# Patient Record
Sex: Female | Born: 1955 | Race: Black or African American | Hispanic: No | Marital: Single | Smoking: Current some day smoker
Health system: Southern US, Community
[De-identification: ages and names within clinical notes are randomized; demographics above are authoritative.]

## PROBLEM LIST (undated history)

## (undated) DIAGNOSIS — C50919 Malignant neoplasm of unspecified site of unspecified female breast: Secondary | ICD-10-CM

## (undated) DIAGNOSIS — I10 Essential (primary) hypertension: Secondary | ICD-10-CM

## (undated) DIAGNOSIS — C801 Malignant (primary) neoplasm, unspecified: Secondary | ICD-10-CM

## (undated) DIAGNOSIS — Z853 Personal history of malignant neoplasm of breast: Secondary | ICD-10-CM

## (undated) DIAGNOSIS — IMO0002 Reserved for concepts with insufficient information to code with codable children: Secondary | ICD-10-CM

## (undated) DIAGNOSIS — R7303 Prediabetes: Secondary | ICD-10-CM

## (undated) DIAGNOSIS — Z923 Personal history of irradiation: Secondary | ICD-10-CM

## (undated) DIAGNOSIS — J189 Pneumonia, unspecified organism: Secondary | ICD-10-CM

## (undated) DIAGNOSIS — Z87891 Personal history of nicotine dependence: Secondary | ICD-10-CM

## (undated) HISTORY — DX: Personal history of nicotine dependence: Z87.891

## (undated) HISTORY — DX: Malignant (primary) neoplasm, unspecified: C80.1

## (undated) HISTORY — DX: Personal history of malignant neoplasm of breast: Z85.3

## (undated) HISTORY — DX: Essential (primary) hypertension: I10

## (undated) HISTORY — DX: Pneumonia, unspecified organism: J18.9

---

## 1993-01-05 HISTORY — PX: BREAST BIOPSY: SHX20

## 1993-01-05 HISTORY — PX: ABDOMINAL HYSTERECTOMY: SHX81

## 2003-10-12 ENCOUNTER — Emergency Department: Payer: Self-pay | Admitting: Emergency Medicine

## 2003-10-27 ENCOUNTER — Emergency Department: Payer: Self-pay | Admitting: Emergency Medicine

## 2003-12-12 ENCOUNTER — Ambulatory Visit: Payer: Self-pay

## 2004-03-26 ENCOUNTER — Emergency Department: Payer: Self-pay | Admitting: Emergency Medicine

## 2004-12-10 ENCOUNTER — Ambulatory Visit: Payer: Self-pay

## 2004-12-23 ENCOUNTER — Ambulatory Visit: Payer: Self-pay

## 2005-01-05 DIAGNOSIS — C50919 Malignant neoplasm of unspecified site of unspecified female breast: Secondary | ICD-10-CM

## 2005-01-05 DIAGNOSIS — IMO0001 Reserved for inherently not codable concepts without codable children: Secondary | ICD-10-CM

## 2005-01-05 DIAGNOSIS — Z923 Personal history of irradiation: Secondary | ICD-10-CM

## 2005-01-05 HISTORY — DX: Reserved for inherently not codable concepts without codable children: IMO0001

## 2005-01-05 HISTORY — PX: AXILLARY SENTINEL NODE BIOPSY: SHX5738

## 2005-01-05 HISTORY — PX: BREAST BIOPSY: SHX20

## 2005-01-05 HISTORY — PX: BREAST SURGERY: SHX581

## 2005-01-05 HISTORY — DX: Personal history of irradiation: Z92.3

## 2005-01-05 HISTORY — DX: Malignant neoplasm of unspecified site of unspecified female breast: C50.919

## 2005-01-05 HISTORY — PX: BREAST LUMPECTOMY: SHX2

## 2005-01-14 ENCOUNTER — Other Ambulatory Visit: Payer: Self-pay

## 2005-01-16 ENCOUNTER — Ambulatory Visit: Payer: Self-pay | Admitting: General Surgery

## 2005-01-30 ENCOUNTER — Ambulatory Visit: Payer: Self-pay | Admitting: General Surgery

## 2005-02-19 ENCOUNTER — Ambulatory Visit: Payer: Self-pay | Admitting: General Surgery

## 2005-02-24 ENCOUNTER — Emergency Department: Payer: Self-pay | Admitting: General Practice

## 2005-02-24 ENCOUNTER — Other Ambulatory Visit: Payer: Self-pay

## 2005-03-05 ENCOUNTER — Ambulatory Visit: Payer: Self-pay | Admitting: General Surgery

## 2005-04-05 ENCOUNTER — Ambulatory Visit: Payer: Self-pay | Admitting: General Surgery

## 2005-04-19 ENCOUNTER — Emergency Department: Payer: Self-pay | Admitting: Emergency Medicine

## 2005-05-05 ENCOUNTER — Ambulatory Visit: Payer: Self-pay | Admitting: General Surgery

## 2005-06-01 ENCOUNTER — Ambulatory Visit: Payer: Self-pay | Admitting: Family Medicine

## 2005-06-05 ENCOUNTER — Ambulatory Visit: Payer: Self-pay | Admitting: General Surgery

## 2005-08-06 ENCOUNTER — Ambulatory Visit: Payer: Self-pay | Admitting: Internal Medicine

## 2005-08-14 ENCOUNTER — Other Ambulatory Visit: Payer: Self-pay

## 2005-08-14 ENCOUNTER — Observation Stay: Payer: Self-pay | Admitting: Internal Medicine

## 2005-08-18 ENCOUNTER — Ambulatory Visit: Payer: Self-pay | Admitting: Internal Medicine

## 2005-09-05 ENCOUNTER — Ambulatory Visit: Payer: Self-pay | Admitting: Internal Medicine

## 2005-11-23 ENCOUNTER — Ambulatory Visit: Payer: Self-pay | Admitting: Radiation Oncology

## 2006-01-20 ENCOUNTER — Ambulatory Visit: Payer: Self-pay

## 2006-01-22 ENCOUNTER — Ambulatory Visit: Payer: Self-pay | Admitting: Internal Medicine

## 2006-02-05 ENCOUNTER — Ambulatory Visit: Payer: Self-pay | Admitting: Internal Medicine

## 2006-04-05 ENCOUNTER — Emergency Department: Payer: Self-pay | Admitting: Internal Medicine

## 2006-04-05 ENCOUNTER — Other Ambulatory Visit: Payer: Self-pay

## 2006-04-06 ENCOUNTER — Emergency Department: Payer: Self-pay | Admitting: General Practice

## 2006-06-11 ENCOUNTER — Ambulatory Visit: Payer: Self-pay | Admitting: Internal Medicine

## 2006-07-01 ENCOUNTER — Ambulatory Visit: Payer: Self-pay | Admitting: Family Medicine

## 2006-07-06 ENCOUNTER — Ambulatory Visit: Payer: Self-pay | Admitting: Internal Medicine

## 2006-07-16 ENCOUNTER — Ambulatory Visit: Payer: Self-pay | Admitting: Internal Medicine

## 2006-08-04 IMAGING — NM NM SENTINAL NODE INJECTION (BREAST) - NO REPORT
1 series · 4 of 4 positions shown · non-contrast
Comparison: none

REASON FOR EXAM: Rt breast cancer
COMMENTS:

[Series 0: breastsentinel · 1.9mm · 1.95mm/px · 4 of 4 frames shown]
[frame 1/4]
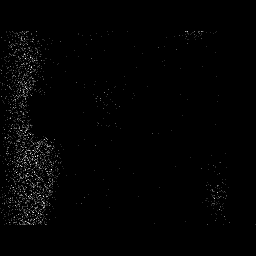
[frame 2/4]
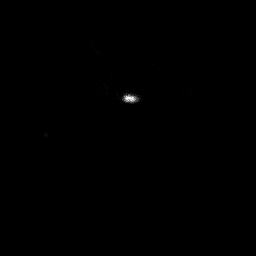
[frame 3/4]
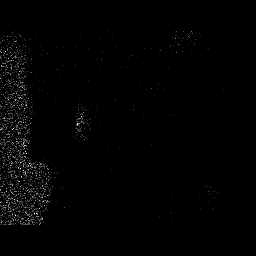
[frame 4/4]
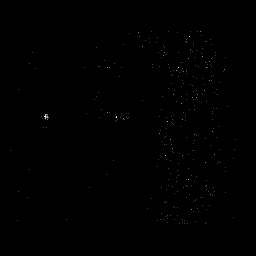

[4 of 4 positions shown; findings below may reference images not displayed]

PROCEDURE:     NM  - NM SENTINEL NODE  BREAST  - January 30, 2005 [DATE]

RESULT:          Following sterile preparation and draping of the patient,
local anesthesia was administered  with 1% lidocaine, and 1 mCi of
technetium-55m sulfur colloid was administered in the periareolar region for
sentinel node imaging.  There were no complications.  No definite nodes are
noted at 30 minutes.
IMPRESSION: Successful injection for sentinel node imaging, no
nodes noted at 30 minutes.  The patient was requested in surgery.

## 2006-08-06 ENCOUNTER — Ambulatory Visit: Payer: Self-pay | Admitting: Internal Medicine

## 2006-08-07 ENCOUNTER — Emergency Department: Payer: Self-pay

## 2006-08-07 ENCOUNTER — Other Ambulatory Visit: Payer: Self-pay

## 2006-11-06 ENCOUNTER — Ambulatory Visit: Payer: Self-pay | Admitting: Internal Medicine

## 2006-11-12 ENCOUNTER — Ambulatory Visit: Payer: Self-pay | Admitting: Internal Medicine

## 2006-12-06 ENCOUNTER — Ambulatory Visit: Payer: Self-pay | Admitting: Internal Medicine

## 2007-01-26 ENCOUNTER — Ambulatory Visit: Payer: Self-pay

## 2007-05-06 ENCOUNTER — Ambulatory Visit: Payer: Self-pay | Admitting: Internal Medicine

## 2007-05-13 ENCOUNTER — Ambulatory Visit: Payer: Self-pay | Admitting: Internal Medicine

## 2007-06-06 ENCOUNTER — Ambulatory Visit: Payer: Self-pay | Admitting: Internal Medicine

## 2007-07-18 ENCOUNTER — Observation Stay: Payer: Self-pay | Admitting: Internal Medicine

## 2007-07-18 ENCOUNTER — Other Ambulatory Visit: Payer: Self-pay

## 2007-08-16 ENCOUNTER — Other Ambulatory Visit: Payer: Self-pay

## 2007-08-16 ENCOUNTER — Emergency Department: Payer: Self-pay | Admitting: Emergency Medicine

## 2007-09-06 ENCOUNTER — Ambulatory Visit: Payer: Self-pay | Admitting: Internal Medicine

## 2007-10-06 ENCOUNTER — Ambulatory Visit: Payer: Self-pay | Admitting: Gastroenterology

## 2007-11-06 ENCOUNTER — Ambulatory Visit: Payer: Self-pay | Admitting: Internal Medicine

## 2007-11-16 ENCOUNTER — Ambulatory Visit: Payer: Self-pay | Admitting: Internal Medicine

## 2007-12-06 ENCOUNTER — Ambulatory Visit: Payer: Self-pay | Admitting: Internal Medicine

## 2008-02-13 ENCOUNTER — Ambulatory Visit: Payer: Self-pay | Admitting: Internal Medicine

## 2008-03-13 ENCOUNTER — Ambulatory Visit: Payer: Self-pay | Admitting: Family Medicine

## 2008-05-01 ENCOUNTER — Emergency Department: Payer: Self-pay | Admitting: Emergency Medicine

## 2008-06-05 ENCOUNTER — Ambulatory Visit: Payer: Self-pay | Admitting: Internal Medicine

## 2008-06-15 ENCOUNTER — Ambulatory Visit: Payer: Self-pay | Admitting: Internal Medicine

## 2008-07-05 ENCOUNTER — Ambulatory Visit: Payer: Self-pay | Admitting: Internal Medicine

## 2008-12-15 ENCOUNTER — Emergency Department: Payer: Self-pay | Admitting: Emergency Medicine

## 2009-02-21 ENCOUNTER — Ambulatory Visit: Payer: Self-pay | Admitting: General Surgery

## 2009-05-19 ENCOUNTER — Emergency Department: Payer: Self-pay | Admitting: Emergency Medicine

## 2009-08-03 ENCOUNTER — Emergency Department: Payer: Self-pay | Admitting: Emergency Medicine

## 2009-11-11 ENCOUNTER — Ambulatory Visit: Payer: Self-pay | Admitting: Internal Medicine

## 2009-12-05 ENCOUNTER — Ambulatory Visit: Payer: Self-pay | Admitting: Internal Medicine

## 2009-12-05 ENCOUNTER — Emergency Department: Payer: Self-pay | Admitting: Emergency Medicine

## 2010-02-25 ENCOUNTER — Ambulatory Visit: Payer: Self-pay | Admitting: General Surgery

## 2010-06-13 ENCOUNTER — Ambulatory Visit: Payer: Self-pay | Admitting: Internal Medicine

## 2010-07-06 ENCOUNTER — Ambulatory Visit: Payer: Self-pay | Admitting: Internal Medicine

## 2010-09-02 ENCOUNTER — Emergency Department: Payer: Self-pay | Admitting: *Deleted

## 2010-12-08 ENCOUNTER — Emergency Department: Payer: Self-pay | Admitting: Emergency Medicine

## 2011-01-03 ENCOUNTER — Emergency Department: Payer: Self-pay | Admitting: Emergency Medicine

## 2011-03-10 ENCOUNTER — Ambulatory Visit: Payer: Self-pay | Admitting: General Surgery

## 2011-08-21 ENCOUNTER — Emergency Department: Payer: Self-pay | Admitting: Emergency Medicine

## 2011-08-21 LAB — URINALYSIS, COMPLETE
Ketone: NEGATIVE
Ph: 5 (ref 4.5–8.0)
Protein: NEGATIVE
Specific Gravity: 1.012 (ref 1.003–1.030)
WBC UR: 3 /HPF (ref 0–5)

## 2011-11-13 ENCOUNTER — Emergency Department: Payer: Self-pay | Admitting: Emergency Medicine

## 2011-11-13 LAB — URINALYSIS, COMPLETE
Bilirubin,UR: NEGATIVE
Glucose,UR: NEGATIVE mg/dL (ref 0–75)
Leukocyte Esterase: NEGATIVE
Nitrite: NEGATIVE
RBC,UR: 4 /HPF (ref 0–5)
Squamous Epithelial: 10
WBC UR: <1 /HPF (ref 0–5)

## 2011-11-13 LAB — COMPREHENSIVE METABOLIC PANEL
Albumin: 3.7 g/dL (ref 3.4–5.0)
Anion Gap: 8 (ref 7–16)
BUN: 10 mg/dL (ref 7–18)
Bilirubin,Total: 0.4 mg/dL (ref 0.2–1.0)
Calcium, Total: 8.8 mg/dL (ref 8.5–10.1)
Chloride: 109 mmol/L — ABNORMAL HIGH (ref 98–107)
Creatinine: 0.8 mg/dL (ref 0.60–1.30)
Glucose: 100 mg/dL — ABNORMAL HIGH (ref 65–99)
Osmolality: 284 (ref 275–301)
Potassium: 4.1 mmol/L (ref 3.5–5.1)
Sodium: 143 mmol/L (ref 136–145)
Total Protein: 7.4 g/dL (ref 6.4–8.2)

## 2011-11-13 LAB — CBC
HCT: 41.1 % (ref 35.0–47.0)
HGB: 14.3 g/dL (ref 12.0–16.0)
MCH: 31.5 pg (ref 26.0–34.0)
MCHC: 34.7 g/dL (ref 32.0–36.0)
MCV: 91 fL (ref 80–100)
RDW: 12.2 % (ref 11.5–14.5)

## 2012-01-07 ENCOUNTER — Emergency Department: Payer: Self-pay | Admitting: Emergency Medicine

## 2012-02-20 ENCOUNTER — Encounter: Payer: Self-pay | Admitting: General Surgery

## 2012-03-28 ENCOUNTER — Ambulatory Visit: Payer: Self-pay | Admitting: Family Medicine

## 2012-04-06 ENCOUNTER — Ambulatory Visit: Payer: Self-pay | Admitting: General Surgery

## 2012-04-07 ENCOUNTER — Encounter: Payer: Self-pay | Admitting: General Surgery

## 2012-04-07 NOTE — Progress Notes (Signed)
Quick Note:  Make sure the additional views are done prior to her office visit ______ 

## 2012-04-08 ENCOUNTER — Ambulatory Visit: Payer: Self-pay | Admitting: General Surgery

## 2012-04-08 NOTE — Progress Notes (Signed)
Patient is scheduled for additional views for 04-08-12 @ 10 am. She is scheduled for office visit follow up on 04-18-12 @ 2 pm.

## 2012-04-11 ENCOUNTER — Encounter: Payer: Self-pay | Admitting: General Surgery

## 2012-04-12 NOTE — Progress Notes (Signed)
Patient had additional views completed on 04-08-12.

## 2012-04-18 ENCOUNTER — Ambulatory Visit: Payer: Self-pay | Admitting: General Surgery

## 2012-05-12 ENCOUNTER — Ambulatory Visit: Payer: Self-pay | Admitting: General Surgery

## 2012-05-17 ENCOUNTER — Emergency Department: Payer: Self-pay | Admitting: Emergency Medicine

## 2012-05-17 LAB — CBC
HCT: 39.8 % (ref 35.0–47.0)
HGB: 13.8 g/dL (ref 12.0–16.0)
MCH: 31.2 pg (ref 26.0–34.0)
MCHC: 34.8 g/dL (ref 32.0–36.0)
MCV: 90 fL (ref 80–100)
Platelet: 166 10*3/uL (ref 150–440)
WBC: 5.2 10*3/uL (ref 3.6–11.0)

## 2012-05-17 LAB — COMPREHENSIVE METABOLIC PANEL
Albumin: 3.7 g/dL (ref 3.4–5.0)
Alkaline Phosphatase: 70 U/L (ref 50–136)
Anion Gap: 5 — ABNORMAL LOW (ref 7–16)
BUN: 15 mg/dL (ref 7–18)
Bilirubin,Total: 0.3 mg/dL (ref 0.2–1.0)
Chloride: 106 mmol/L (ref 98–107)
Co2: 28 mmol/L (ref 21–32)
EGFR (Non-African Amer.): >60
Glucose: 95 mg/dL (ref 65–99)
Osmolality: 278 (ref 275–301)
Potassium: 3.8 mmol/L (ref 3.5–5.1)
SGOT(AST): 14 U/L — ABNORMAL LOW (ref 15–37)
Sodium: 139 mmol/L (ref 136–145)

## 2012-05-17 LAB — TROPONIN I: Troponin-I: <0.02 ng/mL

## 2012-05-17 LAB — PRO B NATRIURETIC PEPTIDE: B-Type Natriuretic Peptide: 55 pg/mL (ref 0–125)

## 2012-06-07 ENCOUNTER — Ambulatory Visit: Payer: Self-pay | Admitting: General Surgery

## 2012-06-08 ENCOUNTER — Encounter: Payer: Self-pay | Admitting: *Deleted

## 2012-10-27 ENCOUNTER — Ambulatory Visit (INDEPENDENT_AMBULATORY_CARE_PROVIDER_SITE_OTHER): Payer: BC Managed Care – PPO | Admitting: General Surgery

## 2012-10-27 ENCOUNTER — Encounter: Payer: Self-pay | Admitting: General Surgery

## 2012-10-27 VITALS — BP 140/78 | HR 78 | Resp 12 | Ht 62.0 in | Wt 156.0 lb

## 2012-10-27 DIAGNOSIS — Z860100 Personal history of colon polyps, unspecified: Secondary | ICD-10-CM | POA: Insufficient documentation

## 2012-10-27 DIAGNOSIS — Z853 Personal history of malignant neoplasm of breast: Secondary | ICD-10-CM

## 2012-10-27 MED ORDER — POLYETHYLENE GLYCOL 3350 17 GM/SCOOP PO POWD: 17.0000 g | Freq: Every day | ORAL | Status: DC

## 2012-10-27 NOTE — Progress Notes (Signed)
Patient ID: Adriana Berg, female   DOB: Feb 14, 1955, 57 y.o.   MRN: [id number]  Chief Complaint  Patient presents with  . Follow-up    mammogram    HPI Adriana Berg is a 57 y.o. female who presents for a breast evaluation. The most recent mammogram was done on 04/07/12.  Patient does perform regular self breast checks and gets regular mammograms done.    HPI  Past Medical History  Diagnosis Date  . Unspecified essential hypertension   . Personal history of tobacco use, presenting hazards to health   . Personal history of malignant neoplasm of breast     invasive ductal carcinoma with tubular features,Stage 1,ER pos,HER2 neg  . Breast screening, unspecified   . Special screening for malignant neoplasms, colon     Past Surgical History  Procedure Laterality Date  . Axillary sentinel node biopsy Right 2007  . Breast biopsy Right 2007  . Breast cyst excision Right 1996  . Abdominal hysterectomy  1995  . Colonoscopy  2009    History reviewed. No pertinent family history.  Social History History  Substance Use Topics  . Smoking status: Current Every Day Smoker -- 0.50 packs/day for 20 years  . Smokeless tobacco: Never Used  . Alcohol Use: No    Allergies  Allergen Reactions  . Azithromycin Swelling    Current Outpatient Prescriptions  Medication Sig Dispense Refill  . ibuprofen (ADVIL,MOTRIN) 200 MG tablet Take 200 mg by mouth every 6 (six) hours as needed for pain.      . metoprolol tartrate (LOPRESSOR) 25 MG tablet Take 25 mg by mouth 2 (two) times daily.      . quinapril (ACCUPRIL) 40 MG tablet Take 1 tablet by mouth daily.      . polyethylene glycol powder (GLYCOLAX/MIRALAX) powder Take 17 g by mouth daily.  255 g  0   No current facility-administered medications for this visit.    Review of Systems Review of Systems  Constitutional: Negative.   Respiratory: Negative.   Cardiovascular: Negative.     Blood pressure 140/78, pulse 78, resp. rate 12, height 5\' 2"   (1.575 m), weight 156 lb (70.761 kg).  Physical Exam Physical Exam  Constitutional: She is oriented to person, place, and time. She appears well-developed and well-nourished.  Eyes: No scleral icterus.  Neck: No mass and no thyromegaly present.  Cardiovascular: Normal rate, regular rhythm, normal heart sounds, intact distal pulses and normal pulses.   Pulses:      Dorsalis pedis pulses are 2+ on the right side, and 2+ on the left side.       Posterior tibial pulses are 2+ on the right side, and 2+ on the left side.  No edema   Pulmonary/Chest: Breath sounds normal. Right breast exhibits no inverted nipple, no mass, no nipple discharge, no skin change and no tenderness. Left breast exhibits no inverted nipple, no mass, no nipple discharge, no skin change and no tenderness.  Right breast lumpectomy site is well healed.  Abdominal: Soft. Normal appearance and bowel sounds are normal. There is no tenderness.  Lymphadenopathy:    She has no cervical adenopathy.    She has no axillary adenopathy.  Neurological: She is alert and oriented to person, place, and time.  Skin: Skin is warm and dry.    Data Reviewed Mammogram reviewed   Assessment    Stable exam. 7 yrs post right breast CA treatment. Has history opf colon polyps and is dure for surveillance colonoscopy.  Plan    Patient to return in 6 months bilateral diagnotic mammogram.     Patient has been scheduled for a colonoscopy on 12/27/12 @ [facility]. Patient is aware of date and instructions. Patient to call with any questions or changes in health status or medications.  SANKAR,SEEPLAPUTHUR G 10/27/2012, 11:38 AM

## 2012-10-27 NOTE — Patient Instructions (Addendum)
Patient to return in 6 months  bilateral screening mammogram. Colonoscopy A colonoscopy is an exam to evaluate your entire colon. In this exam, your colon is cleansed. A long fiberoptic tube is inserted through your rectum and into your colon. The fiberoptic scope (endoscope) is a long bundle of enclosed and very flexible fibers. These fibers transmit light to the area examined and send images from that area to your caregiver. Discomfort is usually minimal. You may be given a drug to help you sleep (sedative) during or prior to the procedure. This exam helps to detect lumps (tumors), polyps, inflammation, and areas of bleeding. Your caregiver may also take a small piece of tissue (biopsy) that will be examined under a microscope. LET YOUR CAREGIVER KNOW ABOUT:   Allergies to food or medicine.  Medicines taken, including vitamins, herbs, eyedrops, over-the-counter medicines, and creams.  Use of steroids (by mouth or creams).  Previous problems with anesthetics or numbing medicines.  History of bleeding problems or blood clots.  Previous surgery.  Other health problems, including diabetes and kidney problems.  Possibility of pregnancy, if this applies. BEFORE THE PROCEDURE   A clear liquid diet may be required for 2 days before the exam.  Ask your caregiver about changing or stopping your regular medications.  Liquid injections (enemas) or laxatives may be required.  A large amount of electrolyte solution may be given to you to drink over a short period of time. This solution is used to clean out your colon.  You should be present 60 minutes prior to your procedure or as directed by your caregiver. AFTER THE PROCEDURE   If you received a sedative or pain relieving medication, you will need to arrange for someone to drive you home.  Occasionally, there is a little blood passed with the first bowel movement. Do not be concerned. FINDING OUT THE RESULTS OF YOUR TEST Not all test  results are available during your visit. If your test results are not back during the visit, make an appointment with your caregiver to find out the results. Do not assume everything is normal if you have not heard from your caregiver or the medical facility. It is important for you to follow up on all of your test results. HOME CARE INSTRUCTIONS   It is not unusual to pass moderate amounts of gas and experience mild abdominal cramping following the procedure. This is due to air being used to inflate your colon during the exam. Walking or a warm pack on your belly (abdomen) may help.  You may resume all normal meals and activities after sedatives and medicines have worn off.  Only take over-the-counter or prescription medicines for pain, discomfort, or fever as directed by your caregiver. Do not use aspirin or blood thinners if a biopsy was taken. Consult your caregiver for medicine usage if biopsies were taken. SEEK IMMEDIATE MEDICAL CARE IF:   You have a fever.  You pass large blood clots or fill a toilet with blood following the procedure. This may also occur 10 to 14 days following the procedure. This is more likely if a biopsy was taken.  You develop abdominal pain that keeps getting worse and cannot be relieved with medicine. Document Released: 12/20/1999 Document Revised: 03/16/2011 Document Reviewed: 08/04/2007 [facility] Patient Information 2014 [place], [place].   Patient has been scheduled for a colonoscopy on 12/27/12 @ [facility]. Patient is aware of date and instructions. Patient to call with any questions or changes in health status or medications.

## 2012-10-27 NOTE — Addendum Note (Signed)
Addended by: Kieth Brightly on: 10/27/2012 11:41 AM   Modules accepted: Level of Service

## 2012-11-08 ENCOUNTER — Ambulatory Visit: Payer: Self-pay | Admitting: Family Medicine

## 2012-12-22 ENCOUNTER — Other Ambulatory Visit: Payer: Self-pay | Admitting: General Surgery

## 2012-12-22 ENCOUNTER — Telehealth: Payer: Self-pay | Admitting: *Deleted

## 2012-12-22 DIAGNOSIS — Z86010 Personal history of colonic polyps: Secondary | ICD-10-CM

## 2012-12-22 MED ORDER — POLYETHYLENE GLYCOL 3350 17 GM/SCOOP PO POWD: ORAL | Status: DC

## 2012-12-22 NOTE — Telephone Encounter (Signed)
Patient called the office to report that she needs Miralax prescription re-sent to pharmacy for 12-27-12 colonoscopy. This has been sent in as requested.

## 2012-12-27 ENCOUNTER — Ambulatory Visit: Payer: Self-pay | Admitting: General Surgery

## 2012-12-27 DIAGNOSIS — K620 Anal polyp: Secondary | ICD-10-CM

## 2012-12-27 DIAGNOSIS — Z86010 Personal history of colonic polyps: Secondary | ICD-10-CM

## 2012-12-27 DIAGNOSIS — K621 Rectal polyp: Secondary | ICD-10-CM

## 2012-12-27 HISTORY — PX: COLONOSCOPY: SHX174

## 2013-01-02 ENCOUNTER — Encounter: Payer: Self-pay | Admitting: General Surgery

## 2013-04-07 ENCOUNTER — Ambulatory Visit: Payer: Self-pay | Admitting: General Surgery

## 2013-04-10 ENCOUNTER — Encounter: Payer: Self-pay | Admitting: General Surgery

## 2013-04-17 ENCOUNTER — Ambulatory Visit (INDEPENDENT_AMBULATORY_CARE_PROVIDER_SITE_OTHER): Payer: BC Managed Care – PPO | Admitting: General Surgery

## 2013-04-17 ENCOUNTER — Encounter: Payer: Self-pay | Admitting: General Surgery

## 2013-04-17 ENCOUNTER — Other Ambulatory Visit: Payer: BC Managed Care – PPO

## 2013-04-17 VITALS — BP 130/74 | HR 78 | Resp 14 | Ht 62.0 in | Wt 160.0 lb

## 2013-04-17 DIAGNOSIS — Z853 Personal history of malignant neoplasm of breast: Secondary | ICD-10-CM

## 2013-04-17 DIAGNOSIS — N644 Mastodynia: Secondary | ICD-10-CM

## 2013-04-17 NOTE — Progress Notes (Signed)
Patient ID: Adriana Berg, female   DOB: [birth year], 58 y.o.   MRN: [id number]  Chief Complaint  Patient presents with  . Follow-up    mammogram    HPI Adriana Berg is a 58 y.o. female who presents for a breast evaluation. The most recent mammogram was done on 4/3/15Patient does perform regular self breast checks and gets regular mammograms done. Patient states her left breast has been sore for about two months now. It is in upper part.  HPI  Past Medical History  Diagnosis Date  . Unspecified essential hypertension   . Personal history of tobacco use, presenting hazards to health   . Personal history of malignant neoplasm of breast     invasive ductal carcinoma with tubular features,Stage 1,ER pos,HER2 neg  . Breast screening, unspecified   . Special screening for malignant neoplasms, colon     Past Surgical History  Procedure Laterality Date  . Axillary sentinel node biopsy Right 2007  . Breast biopsy Right 2007  . Breast cyst excision Right 1996  . Abdominal hysterectomy  1995  . Colonoscopy  2009    No family history on file.  Social History History  Substance Use Topics  . Smoking status: Current Every Day Smoker -- 0.50 packs/day for 20 years  . Smokeless tobacco: Never Used  . Alcohol Use: No    Allergies  Allergen Reactions  . Azithromycin Swelling    Current Outpatient Prescriptions  Medication Sig Dispense Refill  . ibuprofen (ADVIL,MOTRIN) 200 MG tablet Take 200 mg by mouth every 6 (six) hours as needed for pain.      . metoprolol tartrate (LOPRESSOR) 25 MG tablet Take 25 mg by mouth 2 (two) times daily.      . quinapril (ACCUPRIL) 40 MG tablet Take 1 tablet by mouth daily.       No current facility-administered medications for this visit.    Review of Systems Review of Systems  Constitutional: Negative.   Respiratory: Negative.   Cardiovascular: Negative.     Blood pressure 130/74, pulse 78, resp. rate 14, height '5\' 2"'  (1.575 m), weight 160 lb  (72.576 kg).  Physical Exam Physical Exam  Constitutional: She is oriented to person, place, and time. She appears well-developed and well-nourished.  Eyes: Conjunctivae are normal.  Neck: Neck supple. No mass and no thyromegaly present.  Cardiovascular: Normal rate, regular rhythm and normal heart sounds.   Pulmonary/Chest: Breath sounds normal. Right breast exhibits no inverted nipple, no mass, no nipple discharge, no skin change and no tenderness. Left breast exhibits no inverted nipple, no mass, no nipple discharge, no skin change and no tenderness.  Abdominal: Soft. Bowel sounds are normal. There is no tenderness.  Lymphadenopathy:    She has no cervical adenopathy.    She has no axillary adenopathy.  Neurological: She is alert and oriented to person, place, and time.  Skin: Skin is warm and dry.    Data Reviewed Mammogram reviewed  Assessment    Stable exam. Performed left breast ultrasound at 11 o'clock at the site of her pain, no findings. Patient is  now 8 year post right breast lumpectomy for Ca.    Plan    Patient to return in one year bilateral screening mammogram.       Gaspar Cola 04/17/2013, 9:38 AM

## 2013-04-17 NOTE — Patient Instructions (Signed)
Patient to return in one year bilateral screening mammogram. 

## 2013-07-26 DIAGNOSIS — M25539 Pain in unspecified wrist: Secondary | ICD-10-CM | POA: Insufficient documentation

## 2013-11-06 ENCOUNTER — Encounter: Payer: Self-pay | Admitting: General Surgery

## 2014-02-05 ENCOUNTER — Emergency Department: Payer: Self-pay | Admitting: General Practice

## 2014-04-09 ENCOUNTER — Encounter: Payer: Self-pay | Admitting: General Surgery

## 2014-04-09 ENCOUNTER — Ambulatory Visit
Admit: 2014-04-09 | Disposition: A | Discharge status: Home / Self Care | Payer: Self-pay | Attending: General Surgery | Admitting: General Surgery

## 2014-04-17 ENCOUNTER — Ambulatory Visit: Payer: Self-pay | Admitting: General Surgery

## 2014-04-23 ENCOUNTER — Ambulatory Visit (INDEPENDENT_AMBULATORY_CARE_PROVIDER_SITE_OTHER): Payer: BLUE CROSS/BLUE SHIELD | Admitting: General Surgery

## 2014-04-23 ENCOUNTER — Encounter: Payer: Self-pay | Admitting: General Surgery

## 2014-04-23 VITALS — BP 140/76 | HR 72 | Resp 12 | Ht 62.0 in | Wt 174.0 lb

## 2014-04-23 DIAGNOSIS — Z853 Personal history of malignant neoplasm of breast: Secondary | ICD-10-CM

## 2014-04-23 DIAGNOSIS — Z803 Family history of malignant neoplasm of breast: Secondary | ICD-10-CM

## 2014-04-23 NOTE — Patient Instructions (Signed)
Continue self  breast exam. Call for any problems

## 2014-04-23 NOTE — Progress Notes (Signed)
Patient ID: Adriana Berg, female   DOB: 04/11/1955, 59 y.o.   MRN: [id number]  Chief Complaint  Patient presents with  . Follow-up    mammogram    HPI Adriana Berg is a 59 y.o. female who presents for a breast cancer follow up. The most recent mammogram was done on 04/09/14.  Patient does perform regular self breast checks and gets regular mammograms done.  Patient reports some soreness in both breasts.  HPI  Past Medical History  Diagnosis Date  . Unspecified essential hypertension   . Personal history of tobacco use, presenting hazards to health   . Personal history of malignant neoplasm of breast     invasive ductal carcinoma with tubular features,Stage 1,ER pos,HER2 neg  . Cancer     Past Surgical History  Procedure Laterality Date  . Axillary sentinel node biopsy Right 2007  . Abdominal hysterectomy  1995  . Colonoscopy  2009  . Breast surgery      right lumpectomy    Family History  Problem Relation Age of Onset  . Cancer Sister     Social History History  Substance Use Topics  . Smoking status: Current Every Day Smoker -- 0.50 packs/day for 20 years  . Smokeless tobacco: Never Used  . Alcohol Use: No    Allergies  Allergen Reactions  . Azithromycin Swelling    Current Outpatient Prescriptions  Medication Sig Dispense Refill  . quinapril (ACCUPRIL) 40 MG tablet Take 1 tablet by mouth daily.     No current facility-administered medications for this visit.    Review of Systems Review of Systems  Constitutional: Negative.   Respiratory: Negative.   Cardiovascular: Negative.     Blood pressure 140/76, pulse 72, resp. rate 12, height '5\' 2"'  (1.575 m), weight 174 lb (78.926 kg).  Physical Exam Physical Exam  Constitutional: She is oriented to person, place, and time. She appears well-developed and well-nourished.  Eyes: Conjunctivae are normal. No scleral icterus.  Neck: Neck supple. No thyromegaly present.    Cardiovascular: Normal rate, regular  rhythm and normal heart sounds.   Pulmonary/Chest: Effort normal and breath sounds normal. Right breast exhibits tenderness. Right breast exhibits no inverted nipple, no mass, no nipple discharge and no skin change. Left breast exhibits tenderness. Left breast exhibits no inverted nipple, no mass, no nipple discharge and no skin change.  Abdominal: Soft. Bowel sounds are normal. There is no tenderness.  Lymphadenopathy:    She has no cervical adenopathy.    She has no axillary adenopathy.  Neurological: She is alert and oriented to person, place, and time.  Skin: Skin is warm and dry.    Data Reviewed Mammogram reviewed - cat 1  Assessment    Stable exam. History of right breast cancer.  Family history of breast cancer. Skin cyst left side of night -minimally symptomatic.  Plan    The patient has been asked to return to the office in one year with a bilateral screening mammogram.  Excision skin cyst of the neck.      PCP:  Annabell Sabal 04/23/2014, 11:37 AM

## 2014-05-17 ENCOUNTER — Ambulatory Visit: Payer: BLUE CROSS/BLUE SHIELD | Admitting: General Surgery

## 2014-06-06 ENCOUNTER — Encounter: Payer: Self-pay | Admitting: *Deleted

## 2014-11-03 ENCOUNTER — Encounter: Payer: Self-pay | Admitting: Emergency Medicine

## 2014-11-03 ENCOUNTER — Emergency Department: Payer: Commercial Managed Care - HMO

## 2014-11-03 ENCOUNTER — Emergency Department
Admission: EM | Admit: 2014-11-03 | Discharge: 2014-11-03 | Disposition: A | Discharge status: Home / Self Care | Payer: Commercial Managed Care - HMO | Attending: Emergency Medicine | Admitting: Emergency Medicine

## 2014-11-03 DIAGNOSIS — R110 Nausea: Secondary | ICD-10-CM | POA: Insufficient documentation

## 2014-11-03 DIAGNOSIS — R109 Unspecified abdominal pain: Secondary | ICD-10-CM

## 2014-11-03 DIAGNOSIS — R319 Hematuria, unspecified: Secondary | ICD-10-CM

## 2014-11-03 DIAGNOSIS — Z720 Tobacco use: Secondary | ICD-10-CM | POA: Diagnosis not present

## 2014-11-03 DIAGNOSIS — N390 Urinary tract infection, site not specified: Secondary | ICD-10-CM | POA: Diagnosis not present

## 2014-11-03 DIAGNOSIS — I10 Essential (primary) hypertension: Secondary | ICD-10-CM | POA: Diagnosis not present

## 2014-11-03 DIAGNOSIS — R1032 Left lower quadrant pain: Secondary | ICD-10-CM

## 2014-11-03 DIAGNOSIS — Z79899 Other long term (current) drug therapy: Secondary | ICD-10-CM | POA: Diagnosis not present

## 2014-11-03 DIAGNOSIS — R10A0 Flank pain, unspecified side: Secondary | ICD-10-CM

## 2014-11-03 HISTORY — DX: Prediabetes: R73.03

## 2014-11-03 LAB — COMPREHENSIVE METABOLIC PANEL
ALBUMIN: 3.9 g/dL (ref 3.5–5.0)
ALT: 13 U/L — ABNORMAL LOW (ref 14–54)
ANION GAP: 4 — AB (ref 5–15)
AST: 18 U/L (ref 15–41)
Alkaline Phosphatase: 64 U/L (ref 38–126)
BILIRUBIN TOTAL: 0.5 mg/dL (ref 0.3–1.2)
BUN: 12 mg/dL (ref 6–20)
CO2: 28 mmol/L (ref 22–32)
Calcium: 8.9 mg/dL (ref 8.9–10.3)
Chloride: 108 mmol/L (ref 101–111)
Creatinine, Ser: 0.92 mg/dL (ref 0.44–1.00)
GFR calc non Af Amer: >60 mL/min (ref >60)
GLUCOSE: 100 mg/dL — AB (ref 65–99)
POTASSIUM: 4.1 mmol/L (ref 3.5–5.1)
SODIUM: 140 mmol/L (ref 135–145)
TOTAL PROTEIN: 7.5 g/dL (ref 6.5–8.1)

## 2014-11-03 LAB — URINALYSIS COMPLETE WITH MICROSCOPIC (ARMC ONLY)
Bacteria, UA: NONE SEEN
Bilirubin Urine: NEGATIVE
Glucose, UA: NEGATIVE mg/dL
KETONES UR: NEGATIVE mg/dL
NITRITE: NEGATIVE
PROTEIN: NEGATIVE mg/dL
SPECIFIC GRAVITY, URINE: 1.017 (ref 1.005–1.030)
pH: 6 (ref 5.0–8.0)

## 2014-11-03 LAB — LIPASE, BLOOD: Lipase: 27 U/L (ref 11–51)

## 2014-11-03 LAB — CBC
HCT: 42.7 % (ref 35.0–47.0)
HEMOGLOBIN: 14.6 g/dL (ref 12.0–16.0)
MCH: 31.3 pg (ref 26.0–34.0)
MCHC: 34.2 g/dL (ref 32.0–36.0)
MCV: 91.6 fL (ref 80.0–100.0)
Platelets: 216 10*3/uL (ref 150–440)
RBC: 4.66 MIL/uL (ref 3.80–5.20)
RDW: 12.6 % (ref 11.5–14.5)
WBC: 9.4 10*3/uL (ref 3.6–11.0)

## 2014-11-03 MED ORDER — SODIUM CHLORIDE 0.9 % IV BOLUS (SEPSIS)
1000.0000 mL | Freq: Once | INTRAVENOUS | Status: AC
Start: 2014-11-03 — End: 2014-11-03
  Administered 2014-11-03: 1000 mL via INTRAVENOUS

## 2014-11-03 MED ORDER — SULFAMETHOXAZOLE-TRIMETHOPRIM 800-160 MG PO TABS
1.0000 | ORAL_TABLET | Freq: Once | ORAL | Status: AC
  Administered 2014-11-03: 1 via ORAL
  Filled 2014-11-03: qty 1

## 2014-11-03 MED ORDER — SULFAMETHOXAZOLE-TRIMETHOPRIM 800-160 MG PO TABS: 1.0000 | ORAL_TABLET | Freq: Two times a day (BID) | ORAL | Status: DC

## 2014-11-03 MED ORDER — KETOROLAC TROMETHAMINE 30 MG/ML IJ SOLN
30.0000 mg | Freq: Once | INTRAMUSCULAR | Status: AC
  Administered 2014-11-03: 30 mg via INTRAVENOUS
  Filled 2014-11-03: qty 1

## 2014-11-03 NOTE — ED Notes (Signed)
Pt reports lower abd pain, radiating around left side to lower back x 3-4 days.  Pt reports hematuria today, pt reports nausea w/o vomiting, pt reports dysuria.  Pt NAD at this time, respirations equal and unlabored, skin warm and dry.

## 2014-11-03 NOTE — ED Notes (Signed)
Pt taken to CT.

## 2014-11-03 NOTE — Discharge Instructions (Signed)
1. Start antibiotic as prescribed (Septra DS twice daily 7 days). 2. Start daily MiraLAX and stool softener as you have quite a bit of stool in your colon. 3. Return to the ER for worsening symptoms, persistent vomiting, difficulty breathing or other concerns.  Abdominal Pain, Adult Many things can cause abdominal pain. Usually, abdominal pain is not caused by a disease and will improve without treatment. It can often be observed and treated at home. Your health care provider will do a physical exam and possibly order blood tests and X-rays to help determine the seriousness of your pain. However, in many cases, more time must pass before a clear cause of the pain can be found. Before that point, your health care provider may not know if you need more testing or further treatment. HOME CARE INSTRUCTIONS Monitor your abdominal pain for any changes. The following actions may help to alleviate any discomfort you are experiencing:  Only take over-the-counter or prescription medicines as directed by your health care provider.  Do not take laxatives unless directed to do so by your health care provider.  Try a clear liquid diet (broth, tea, or water) as directed by your health care provider. Slowly move to a bland diet as tolerated. SEEK MEDICAL CARE IF:  You have unexplained abdominal pain.  You have abdominal pain associated with nausea or diarrhea.  You have pain when you urinate or have a bowel movement.  You experience abdominal pain that wakes you in the night.  You have abdominal pain that is worsened or improved by eating food.  You have abdominal pain that is worsened with eating fatty foods.  You have a fever. SEEK IMMEDIATE MEDICAL CARE IF:  Your pain does not go away within 2 hours.  You keep throwing up (vomiting).  Your pain is felt only in portions of the abdomen, such as the right side or the left lower portion of the abdomen.  You pass bloody or black tarry  stools. MAKE SURE YOU:  Understand these instructions.  Will watch your condition.  Will get help right away if you are not doing well or get worse.   This information is not intended to replace advice given to you by your health care provider. Make sure you discuss any questions you have with your health care provider.   Document Released: 10/01/2004 Document Revised: 09/12/2014 Document Reviewed: 08/31/2012 Elsevier Interactive Patient Education 2016 [employer]  Dysuria Dysuria is pain or discomfort while urinating. The pain or discomfort may be felt in the tube that carries urine out of the bladder (urethra) or in the surrounding tissue of the genitals. The pain may also be felt in the groin area, lower abdomen, and lower back. You may have to urinate frequently or have the sudden feeling that you have to urinate (urgency). Dysuria can affect both men and women, but is more common in women. Dysuria can be caused by many different things, including:  Urinary tract infection in women.  Infection of the kidney or bladder.  Kidney stones or bladder stones.  Certain sexually transmitted infections (STIs), such as chlamydia.  Dehydration.  Inflammation of the vagina.  Use of certain medicines.  Use of certain soaps or scented products that cause irritation. HOME CARE INSTRUCTIONS Watch your dysuria for any changes. The following actions may help to reduce any discomfort you are feeling:  Drink enough fluid to keep your urine clear or pale yellow.  Empty your bladder often. Avoid holding urine for long periods  of time.  After a bowel movement or urination, women should cleanse from front to back, using each tissue only once.  Empty your bladder after sexual intercourse.  Take medicines only as directed by your health care provider.  If you were prescribed an antibiotic medicine, finish it all even if you start to feel better.  Avoid caffeine, tea, and alcohol. They can  irritate the bladder and make dysuria worse. In men, alcohol may irritate the prostate.  Keep all follow-up visits as directed by your health care provider. This is important.  If you had any tests done to find the cause of dysuria, it is your responsibility to obtain your test results. Ask the lab or department performing the test when and how you will get your results. Talk with your health care provider if you have any questions about your results. SEEK MEDICAL CARE IF:  You develop pain in your back or sides.  You have a fever.  You have nausea or vomiting.  You have blood in your urine.  You are not urinating as often as you usually do. SEEK IMMEDIATE MEDICAL CARE IF:  You pain is severe and not relieved with medicines.  You are unable to hold down any fluids.  You or someone else notices a change in your mental function.  You have a rapid heartbeat at rest.  You have shaking or chills.  You feel extremely weak.   This information is not intended to replace advice given to you by your health care provider. Make sure you discuss any questions you have with your health care provider.   Document Released: 09/20/2003 Document Revised: 01/12/2014 Document Reviewed: 08/17/2013 Elsevier Interactive Patient Education 2016 Else[employer] Inc.  Flank Pain Flank pain refers to pain that is located on the side of the body between the upper abdomen and the back. The pain may occur over a short period of time (acute) or may be long-term or reoccurring (chronic). It may be mild or severe. Flank pain can be caused by many things. CAUSES  Some of the more common causes of flank pain include:  Muscle strains.   Muscle spasms.   A disease of your spine (vertebral disk disease).   A lung infection (pneumonia).   Fluid around your lungs (pulmonary edema).   A kidney infection.   Kidney stones.   A very painful skin rash caused by the chickenpox virus (shingles).    Gallbladder disease.  [place] care will depend on the cause of your pain. In general,  Rest as directed by your caregiver.  Drink enough fluids to keep your urine clear or pale yellow.  Only take over-the-counter or prescription medicines as directed by your caregiver. Some medicines may help relieve the pain.  Tell your caregiver about any changes in your pain.  Follow up with your caregiver as directed. SEEK IMMEDIATE MEDICAL CARE IF:   Your pain is not controlled with medicine.   You have new or worsening symptoms.  Your pain increases.   You have abdominal pain.   You have shortness of breath.   You have persistent nausea or vomiting.   You have swelling in your abdomen.   You feel faint or pass out.   You have blood in your urine.  You have a fever or persistent symptoms for more than 2-3 days.  You have a fever and your symptoms suddenly get worse. MAKE SURE YOU:   Understand these instructions.  Will watch your condition.  Will get help right away if you are not doing well or get worse.   This information is not intended to replace advice given to you by your health care provider. Make sure you discuss any questions you have with your health care provider.   Document Released: 02/12/2005 Document Revised: 09/16/2011 Document Reviewed: 08/06/2011 Elsevier Interactive Patient Education 2016 Elsevier Inc.  Urinary Tract Infection A urinary tract infection (UTI) can occur any place along the urinary tract. The tract includes the kidneys, ureters, bladder, and urethra. A type of germ called bacteria often causes a UTI. UTIs are often helped with antibiotic medicine.  HOME CARE   If given, take antibiotics as told by your doctor. Finish them even if you start to feel better.  Drink enough fluids to keep your pee (urine) clear or pale yellow.  Avoid tea, drinks with caffeine, and bubbly (carbonated) drinks.  Pee often.  Avoid holding your pee in for a long time.  Pee before and after having sex (intercourse).  Wipe from front to back after you poop (bowel movement) if you are a woman. Use each tissue only once. GET HELP RIGHT AWAY IF:   You have back pain.  You have lower belly (abdominal) pain.  You have chills.  You feel sick to your stomach (nauseous).  You throw up (vomit).  Your burning or discomfort with peeing does not go away.  You have a fever.  Your symptoms are not better in 3 days. MAKE SURE YOU:   Understand these instructions.  Will watch your condition.  Will get help right away if you are not doing well or get worse.   This information is not intended to replace advice given to you by your health care provider. Make sure you discuss any questions you have with your health care provider.   Document Released: 06/10/2007 Document Revised: 01/12/2014 Document Reviewed: 07/23/2011 Elsevier Interactive Patient Education [employer].

## 2014-11-03 NOTE — ED Notes (Signed)
Pt states still unable to give urine sample at this time.

## 2014-11-03 NOTE — ED Provider Notes (Signed)
[facility] Emergency Department Provider Note  ____________________________________________  Time seen: Approximately 4:39 AM  I have reviewed the triage vital signs and the nursing notes.   HISTORY  Chief Complaint Back Pain and Abdominal Pain    HPI [name] is a 59 y.o. female who presents to the ED from home with a chief complaint of left flank and left lower quadrant pain. Patient states her symptoms started approximately 3 days ago with pain in her suprapubic region radiating into her left lower quadrant and around to her flank. Patient reports onset of hematuria and dysuria today. Symptoms associated with nausea, no vomiting.No past history of nephrolithiasis. Denies fever, chills, chest pain, shortness of breath, diarrhea. Nothing makes her pain better or worse.   Past Medical History  Diagnosis Date  . Unspecified essential hypertension   . Personal history of tobacco use, presenting hazards to health   . Personal history of malignant neoplasm of breast     invasive ductal carcinoma with tubular features,Stage 1,ER pos,HER2 neg  . Cancer ([place])   . Borderline diabetes     Patient Active Problem List   Diagnosis Date Noted  . History of breast cancer 10/27/2012  . Personal history of colonic polyps 10/27/2012    Past Surgical History  Procedure Laterality Date  . Axillary sentinel node biopsy Right 2007  . Abdominal hysterectomy  1995  . Colonoscopy  2009  . Breast surgery      right lumpectomy    Current Outpatient Rx  Name  Route  Sig  Dispense  Refill  . quinapril (ACCUPRIL) 40 MG tablet   Oral   Take 1 tablet by mouth daily.           Allergies Azithromycin  Family History  Problem Relation Age of Onset  . Cancer Sister     Social History Social History  Substance Use Topics  . Smoking status: Current Every Day Smoker -- 0.50 packs/day for 20 years  . Smokeless tobacco: Never Used  . Alcohol Use: No     Review of Systems Constitutional: No fever/chills Eyes: No visual changes. ENT: No sore throat. Cardiovascular: Denies chest pain. Respiratory: Denies shortness of breath. Gastrointestinal: Positive for abdominal pain.  Positive for nausea, no vomiting.  No diarrhea.  No constipation. Positive for left flank pain. Genitourinary: Positive for for dysuria and hematuria. Musculoskeletal: Negative for back pain. Skin: Negative for rash. Neurological: Negative for headaches, focal weakness or numbness.  10-point ROS otherwise negative.  ____________________________________________   PHYSICAL EXAM:  VITAL SIGNS: ED Triage Vitals  Enc Vitals Group     BP 11/03/14 0354 158/87 mmHg     Pulse Rate 11/03/14 0354 85     Resp 11/03/14 0354 20     Temp 11/03/14 0354 98.2 F (36.8 C)     Temp Source 11/03/14 0354 Oral     SpO2 11/03/14 0354 96 %     Weight 11/03/14 0354 155 lb (70.308 kg)     Height 11/03/14 0354 '5\' 3"'  (1.6 m)     Head Cir --      Peak Flow --      Pain Score 11/03/14 0356 9     Pain Loc --      Pain Edu? --      Excl. in [place]? --     Constitutional: Asleep, easily awakened for exam. Alert and oriented. Well appearing and in no acute distress. Eyes: Conjunctivae are normal. PERRL. EOMI. Head: Atraumatic. Nose:  No congestion/rhinnorhea. Mouth/Throat: Mucous membranes are moist.  Oropharynx non-erythematous. Neck: No stridor.   Cardiovascular: Normal rate, regular rhythm. Grossly normal heart sounds.  Good peripheral circulation. Respiratory: Normal respiratory effort.  No retractions. Lungs CTAB. Gastrointestinal: Soft and mildly tender to palpation suprapubic area without rebound or guarding. No distention. No abdominal bruits. Mild left CVA tenderness. Musculoskeletal: No lower extremity tenderness nor edema.  No joint effusions. Neurologic:  Normal speech and language. No gross focal neurologic deficits are appreciated. No gait instability. Skin:  Skin is  warm, dry and intact. No rash noted. Psychiatric: Mood and affect are normal. Speech and behavior are normal.  ____________________________________________   LABS (all labs ordered are listed, but only abnormal results are displayed)  Labs Reviewed  COMPREHENSIVE METABOLIC PANEL - Abnormal; Notable for the following:    Glucose, Bld 100 (*)    ALT 13 (*)    Anion gap 4 (*)    All other components within normal limits  URINALYSIS COMPLETEWITH MICROSCOPIC ([facility] ONLY) - Abnormal; Notable for the following:    Color, Urine YELLOW (*)    APPearance CLEAR (*)    Hgb urine dipstick 1+ (*)    Leukocytes, UA 1+ (*)    Squamous Epithelial / LPF 6-30 (*)    All other components within normal limits  LIPASE, BLOOD  CBC   ____________________________________________  EKG  None ____________________________________________  RADIOLOGY  CT renal stone study interpreted per Dr. Dorann Lodge: No urolithiasis, obstructive uropathy nor acute intra-abdominal/pelvic process.  Mild amount of retained large bowel stool, small bowel feces compatible chronic stasis. No bowel obstruction.  ____________________________________________   PROCEDURES  Procedure(s) performed: None  Critical Care performed: No  ____________________________________________   INITIAL IMPRESSION / ASSESSMENT AND PLAN / ED COURSE  Pertinent labs & imaging results that were available during my care of the patient were reviewed by me and considered in my medical decision making (see chart for details).  59 year old female who presents with suprapubic pain radiating to left lower quadrant concerning for kidney stone. Will administer IV analgesia, obtain screening lab work and obtain CT renal stone study.  ----------------------------------------- 6:38 AM on 11/03/2014 -----------------------------------------  Patient sleeping. Updated patient of imaging results. Will place on antibiotic for UTI. Strict return  precautions given. Patient verbalizes understanding and agrees with plan of care. ____________________________________________   FINAL CLINICAL IMPRESSION(S) / ED DIAGNOSES  Final diagnoses:  UTI (lower urinary tract infection)  Hematuria  Flank pain  Left lower quadrant pain      Paulette Blanch, MD 11/03/14 [phone]

## 2014-11-03 NOTE — ED Notes (Signed)
Pt alert and oriented X4, active, cooperative, pt in NAD. RR even and unlabored, color WNL.  Pt informed to return if any life threatening symptoms occur.   

## 2014-11-03 NOTE — ED Notes (Signed)
Pt states unable to give urine sample at this time.  Pt instructed to press call bell if/when she feels able to give sample.  Pt verbalized understanding

## 2014-11-03 NOTE — ED Notes (Signed)
Patient reports lower back pain that radiates into her left lower abdomen for 3 days.  Patient reports noticed blood in urine today.

## 2015-01-27 ENCOUNTER — Emergency Department: Payer: Commercial Managed Care - HMO

## 2015-01-27 ENCOUNTER — Inpatient Hospital Stay
Admission: EM | Admit: 2015-01-27 | Discharge: 2015-01-30 | DRG: 194 | Disposition: A | Discharge status: Home / Self Care | Payer: Commercial Managed Care - HMO | Attending: Internal Medicine | Admitting: Internal Medicine

## 2015-01-27 ENCOUNTER — Encounter: Payer: Self-pay | Admitting: Radiology

## 2015-01-27 DIAGNOSIS — Z833 Family history of diabetes mellitus: Secondary | ICD-10-CM | POA: Diagnosis not present

## 2015-01-27 DIAGNOSIS — R0682 Tachypnea, not elsewhere classified: Secondary | ICD-10-CM | POA: Diagnosis present

## 2015-01-27 DIAGNOSIS — R7303 Prediabetes: Secondary | ICD-10-CM | POA: Diagnosis present

## 2015-01-27 DIAGNOSIS — Z79899 Other long term (current) drug therapy: Secondary | ICD-10-CM

## 2015-01-27 DIAGNOSIS — I10 Essential (primary) hypertension: Secondary | ICD-10-CM | POA: Diagnosis present

## 2015-01-27 DIAGNOSIS — Z8249 Family history of ischemic heart disease and other diseases of the circulatory system: Secondary | ICD-10-CM

## 2015-01-27 DIAGNOSIS — Z90710 Acquired absence of both cervix and uterus: Secondary | ICD-10-CM

## 2015-01-27 DIAGNOSIS — A419 Sepsis, unspecified organism: Secondary | ICD-10-CM

## 2015-01-27 DIAGNOSIS — J440 Chronic obstructive pulmonary disease with acute lower respiratory infection: Secondary | ICD-10-CM | POA: Diagnosis present

## 2015-01-27 DIAGNOSIS — Z888 Allergy status to other drugs, medicaments and biological substances status: Secondary | ICD-10-CM | POA: Diagnosis not present

## 2015-01-27 DIAGNOSIS — F17200 Nicotine dependence, unspecified, uncomplicated: Secondary | ICD-10-CM | POA: Diagnosis present

## 2015-01-27 DIAGNOSIS — J189 Pneumonia, unspecified organism: Secondary | ICD-10-CM | POA: Diagnosis present

## 2015-01-27 DIAGNOSIS — Z823 Family history of stroke: Secondary | ICD-10-CM | POA: Diagnosis not present

## 2015-01-27 DIAGNOSIS — Z803 Family history of malignant neoplasm of breast: Secondary | ICD-10-CM | POA: Diagnosis not present

## 2015-01-27 DIAGNOSIS — R6510 Systemic inflammatory response syndrome (SIRS) of non-infectious origin without acute organ dysfunction: Secondary | ICD-10-CM

## 2015-01-27 DIAGNOSIS — Z720 Tobacco use: Secondary | ICD-10-CM

## 2015-01-27 DIAGNOSIS — Z853 Personal history of malignant neoplasm of breast: Secondary | ICD-10-CM

## 2015-01-27 DIAGNOSIS — Z9889 Other specified postprocedural states: Secondary | ICD-10-CM

## 2015-01-27 LAB — CBC
HCT: 45.4 % (ref 35.0–47.0)
HEMOGLOBIN: 15.3 g/dL (ref 12.0–16.0)
MCH: 30.5 pg (ref 26.0–34.0)
MCHC: 33.8 g/dL (ref 32.0–36.0)
MCV: 90.4 fL (ref 80.0–100.0)
Platelets: 174 10*3/uL (ref 150–440)
RBC: 5.02 MIL/uL (ref 3.80–5.20)
RDW: 12.7 % (ref 11.5–14.5)
WBC: 6.2 10*3/uL (ref 3.6–11.0)

## 2015-01-27 LAB — BASIC METABOLIC PANEL
ANION GAP: 8 (ref 5–15)
BUN: 14 mg/dL (ref 6–20)
CHLORIDE: 102 mmol/L (ref 101–111)
CO2: 29 mmol/L (ref 22–32)
Calcium: 9.5 mg/dL (ref 8.9–10.3)
Creatinine, Ser: 0.92 mg/dL (ref 0.44–1.00)
GFR calc non Af Amer: >60 mL/min (ref >60)
Glucose, Bld: 108 mg/dL — ABNORMAL HIGH (ref 65–99)
Potassium: 3.9 mmol/L (ref 3.5–5.1)
SODIUM: 139 mmol/L (ref 135–145)

## 2015-01-27 LAB — GLUCOSE, CAPILLARY
GLUCOSE-CAPILLARY: 101 mg/dL — AB (ref 65–99)
GLUCOSE-CAPILLARY: 100 mg/dL — AB (ref 65–99)

## 2015-01-27 LAB — TROPONIN I

## 2015-01-27 LAB — RAPID INFLUENZA A&B ANTIGENS (ARMC ONLY): INFLUENZA B (ARMC): NEGATIVE

## 2015-01-27 LAB — RAPID INFLUENZA A&B ANTIGENS: Influenza A (ARMC): NEGATIVE

## 2015-01-27 MED ORDER — ALBUTEROL SULFATE (2.5 MG/3ML) 0.083% IN NEBU
2.5000 mg | INHALATION_SOLUTION | Freq: Once | RESPIRATORY_TRACT | Status: AC
  Administered 2015-01-27: 2.5 mg via RESPIRATORY_TRACT
  Filled 2015-01-27: qty 3

## 2015-01-27 MED ORDER — SODIUM CHLORIDE 0.9 % IV BOLUS (SEPSIS)
500.0000 mL | Freq: Once | INTRAVENOUS | Status: AC
  Administered 2015-01-27: 500 mL via INTRAVENOUS

## 2015-01-27 MED ORDER — SODIUM CHLORIDE 0.9 % IV BOLUS (SEPSIS)
1100.0000 mL | Freq: Once | INTRAVENOUS | Status: AC
  Administered 2015-01-27: 1100 mL via INTRAVENOUS

## 2015-01-27 MED ORDER — LEVOFLOXACIN IN D5W 500 MG/100ML IV SOLN
500.0000 mg | INTRAVENOUS | Status: DC
  Administered 2015-01-28 – 2015-01-29 (×2): 500 mg via INTRAVENOUS
  Filled 2015-01-27 (×3): qty 100

## 2015-01-27 MED ORDER — INSULIN ASPART 100 UNIT/ML ~~LOC~~ SOLN
0.0000 [IU] | Freq: Three times a day (TID) | SUBCUTANEOUS | Status: DC
Start: 2015-01-27 — End: 2015-01-30
  Administered 2015-01-28: 17:00:00 1 [IU] via SUBCUTANEOUS
  Administered 2015-01-28: 12:00:00 2 [IU] via SUBCUTANEOUS
  Administered 2015-01-29 (×2): 1 [IU] via SUBCUTANEOUS
  Administered 2015-01-30: 09:00:00 2 [IU] via SUBCUTANEOUS
  Filled 2015-01-27 (×2): qty 1
  Filled 2015-01-27: qty 2
  Filled 2015-01-27: qty 1
  Filled 2015-01-27: qty 2

## 2015-01-27 MED ORDER — LEVOFLOXACIN IN D5W 500 MG/100ML IV SOLN
500.0000 mg | Freq: Once | INTRAVENOUS | Status: AC
  Administered 2015-01-27: 500 mg via INTRAVENOUS
  Filled 2015-01-27: qty 100

## 2015-01-27 MED ORDER — ACETAMINOPHEN 325 MG PO TABS
650.0000 mg | ORAL_TABLET | Freq: Four times a day (QID) | ORAL | Status: DC | PRN
  Administered 2015-01-27: 650 mg via ORAL
  Filled 2015-01-27: qty 2

## 2015-01-27 MED ORDER — ONDANSETRON HCL 4 MG/2ML IJ SOLN: 4.0000 mg | Freq: Four times a day (QID) | INTRAMUSCULAR | Status: DC | PRN

## 2015-01-27 MED ORDER — DOCUSATE SODIUM 100 MG PO CAPS
100.0000 mg | ORAL_CAPSULE | Freq: Two times a day (BID) | ORAL | Status: DC
  Administered 2015-01-28 – 2015-01-30 (×5): 100 mg via ORAL
  Filled 2015-01-27 (×5): qty 1

## 2015-01-27 MED ORDER — METHYLPREDNISOLONE SODIUM SUCC 125 MG IJ SOLR
60.0000 mg | Freq: Four times a day (QID) | INTRAMUSCULAR | Status: DC
  Administered 2015-01-27 – 2015-01-30 (×12): 60 mg via INTRAVENOUS
  Filled 2015-01-27 (×12): qty 2

## 2015-01-27 MED ORDER — LORAZEPAM 2 MG/ML IJ SOLN: 0.5000 mg | INTRAMUSCULAR | Status: DC | PRN

## 2015-01-27 MED ORDER — BISACODYL 10 MG RE SUPP: 10.0000 mg | Freq: Every day | RECTAL | Status: DC | PRN

## 2015-01-27 MED ORDER — KETOROLAC TROMETHAMINE 30 MG/ML IJ SOLN
30.0000 mg | Freq: Once | INTRAMUSCULAR | Status: AC
  Administered 2015-01-27: 30 mg via INTRAVENOUS
  Filled 2015-01-27: qty 1

## 2015-01-27 MED ORDER — ACETAMINOPHEN 650 MG RE SUPP: 650.0000 mg | Freq: Four times a day (QID) | RECTAL | Status: DC | PRN

## 2015-01-27 MED ORDER — SODIUM CHLORIDE 0.9 % IV SOLN
INTRAVENOUS | Status: DC
  Administered 2015-01-27 – 2015-01-28 (×2): via INTRAVENOUS

## 2015-01-27 MED ORDER — SODIUM CHLORIDE 0.9 % IJ SOLN
3.0000 mL | Freq: Two times a day (BID) | INTRAMUSCULAR | Status: DC
  Administered 2015-01-28 – 2015-01-30 (×6): 3 mL via INTRAVENOUS

## 2015-01-27 MED ORDER — LISINOPRIL 20 MG PO TABS
40.0000 mg | ORAL_TABLET | Freq: Every day | ORAL | Status: DC
  Administered 2015-01-27 – 2015-01-28 (×2): 40 mg via ORAL
  Filled 2015-01-27 (×2): qty 2

## 2015-01-27 MED ORDER — HEPARIN SODIUM (PORCINE) 5000 UNIT/ML IJ SOLN
5000.0000 [IU] | Freq: Three times a day (TID) | INTRAMUSCULAR | Status: DC
  Administered 2015-01-27 – 2015-01-29 (×6): 5000 [IU] via SUBCUTANEOUS
  Filled 2015-01-27 (×6): qty 1

## 2015-01-27 MED ORDER — ASPIRIN EC 81 MG PO TBEC
81.0000 mg | DELAYED_RELEASE_TABLET | Freq: Every day | ORAL | Status: DC
  Administered 2015-01-27 – 2015-01-30 (×4): 81 mg via ORAL
  Filled 2015-01-27 (×4): qty 1

## 2015-01-27 MED ORDER — LEVOFLOXACIN IN D5W 750 MG/150ML IV SOLN: 750.0000 mg | Freq: Once | INTRAVENOUS | Status: DC

## 2015-01-27 MED ORDER — IOHEXOL 350 MG/ML SOLN
75.0000 mL | Freq: Once | INTRAVENOUS | Status: AC | PRN
  Administered 2015-01-27: 75 mL via INTRAVENOUS

## 2015-01-27 MED ORDER — QUINAPRIL HCL 10 MG PO TABS: 40.0000 mg | ORAL_TABLET | Freq: Every day | ORAL | Status: DC

## 2015-01-27 MED ORDER — IPRATROPIUM-ALBUTEROL 0.5-2.5 (3) MG/3ML IN SOLN
3.0000 mL | Freq: Four times a day (QID) | RESPIRATORY_TRACT | Status: DC
  Administered 2015-01-27 – 2015-01-28 (×3): 3 mL via RESPIRATORY_TRACT
  Filled 2015-01-27 (×3): qty 3

## 2015-01-27 MED ORDER — ACETAMINOPHEN 500 MG PO TABS
1000.0000 mg | ORAL_TABLET | Freq: Once | ORAL | Status: AC
  Administered 2015-01-27: 1000 mg via ORAL
  Filled 2015-01-27: qty 2

## 2015-01-27 MED ORDER — ONDANSETRON HCL 4 MG PO TABS: 4.0000 mg | ORAL_TABLET | Freq: Four times a day (QID) | ORAL | Status: DC | PRN

## 2015-01-27 MED ORDER — GUAIFENESIN ER 600 MG PO TB12
600.0000 mg | ORAL_TABLET | Freq: Two times a day (BID) | ORAL | Status: DC
  Administered 2015-01-27 – 2015-01-30 (×6): 600 mg via ORAL
  Filled 2015-01-27 (×8): qty 1

## 2015-01-27 MED ORDER — LEVOFLOXACIN IN D5W 250 MG/50ML IV SOLN: 250.0000 mg | INTRAVENOUS | Status: DC

## 2015-01-27 MED ORDER — MORPHINE SULFATE (PF) 2 MG/ML IV SOLN
2.0000 mg | INTRAVENOUS | Status: DC | PRN
  Filled 2015-01-27: qty 1

## 2015-01-27 NOTE — ED Notes (Signed)
Admitting MD at bedside.

## 2015-01-27 NOTE — ED Notes (Signed)
Called for pt for bed in subwait and main waiting room.  No answer in either.

## 2015-01-27 NOTE — H&P (Signed)
History and Physical    Adriana Berg OMA:[id number] DOB: 1955/10/15 DOA: 01/27/2015  Referring physician: Dr. Edd Fabian PCP: Adriana Apo, MD  Specialists: none  Chief Complaint: SOB, cough, CP  HPI: Adriana Berg is a 60 y.o. female has a past medical history significant for COPD/tobacco use, HTN, and breast cancer here with 2-3 day hx of worsening cough with pleuritic CP and SOB. Was started on Septra DS by her PCP 2 days ago. Now worse. In ER, patient had low grade fever with tachycardia and tachypnea. CT of chest reveals bilateral pneumonia. She is now admitted. No N/V/D. Denies cardiac hx. Denies ETOH use but does continue to smoke.  Review of Systems: The patient denies anorexia,  weight loss,, vision loss, decreased hearing, hoarseness,  syncope, dyspnea on exertion, peripheral edema, balance deficits, hemoptysis, abdominal pain, melena, hematochezia, severe indigestion/heartburn, hematuria, incontinence, genital sores, muscle weakness, suspicious skin lesions, transient blindness, difficulty walking, depression, unusual weight change, abnormal bleeding, enlarged lymph nodes, angioedema, and breast masses.   Past Medical History  Diagnosis Date  . Unspecified essential hypertension   . Personal history of tobacco use, presenting hazards to health   . Personal history of malignant neoplasm of breast     invasive ductal carcinoma with tubular features,Stage 1,ER pos,HER2 neg  . Cancer ([place])   . Borderline diabetes    Past Surgical History  Procedure Laterality Date  . Axillary sentinel node biopsy Right 2007  . Abdominal hysterectomy  1995  . Colonoscopy  2009  . Breast surgery      right lumpectomy   Social History:  reports that she has been smoking.  She has never used smokeless tobacco. She reports that she does not drink alcohol or use illicit drugs.  Allergies  Allergen Reactions  . Azithromycin Swelling    Family History  Problem Relation Age of Onset  . Cancer  Sister   . Breast cancer Sister   . Diabetes Mellitus II Mother   . CAD Father   . Stroke Brother     Prior to Admission medications   Medication Sig Start Date End Date Taking? Authorizing Provider  quinapril (ACCUPRIL) 40 MG tablet Take 1 tablet by mouth daily. 09/02/12   Historical Provider, MD  sulfamethoxazole-trimethoprim (BACTRIM DS,SEPTRA DS) 800-160 MG tablet Take 1 tablet by mouth 2 (two) times daily. 11/03/14   Paulette Blanch, MD   Physical Exam: Filed Vitals:   01/27/15 1330 01/27/15 1416 01/27/15 1430 01/27/15 1500  BP: 181/87 181/85 162/80 161/85  Pulse: 109 109 110 109  Temp:      TempSrc:      Resp: '26 16 18 26  ' Height:      Weight:      SpO2: 92% 96% 96% 92%     General:  Acutely ill appearing in moderate distress, NCAT  Eyes: PERRL, EOMI, no scleral icterus, conjunctiva clear  ENT: moist oropharynx with good dentition  Neck: supple, no lymphadenopathy. NO JVD or bruits. No thyromegaly  Cardiovascular: rapid rate with regular rhythm without MRG; 2+ peripheral pulses, no JVD, no peripheral edema  Respiratory: diffuse rhonchi without wheezes or rales. No dullness. Respiratory effort increased  Abdomen: soft, non tender to palpation, positive bowel sounds, no guarding, no rebound  Skin: no rashes  Musculoskeletal: normal bulk and tone, no joint swelling  Psychiatric: normal mood and affect  Neurologic: CN 2-12 grossly intact, Motor strength 5/5 in all 4 groups. Sensory exam normal. DTR's symmetric  Labs on Admission:  Basic Metabolic Panel:  Recent Labs Lab 01/27/15 1030  NA 139  K 3.9  CL 102  CO2 29  GLUCOSE 108*  BUN 14  CREATININE 0.92  CALCIUM 9.5   Liver Function Tests: No results for input(s): AST, ALT, ALKPHOS, BILITOT, PROT, ALBUMIN in the last 168 hours. No results for input(s): LIPASE, AMYLASE in the last 168 hours. No results for input(s): AMMONIA in the last 168 hours. CBC:  Recent Labs Lab 01/27/15 1030  WBC 6.2  HGB  15.3  HCT 45.4  MCV 90.4  PLT 174   Cardiac Enzymes:  Recent Labs Lab 01/27/15 1030  TROPONINI <0.03    BNP (last 3 results) No results for input(s): BNP in the last 8760 hours.  ProBNP (last 3 results) No results for input(s): PROBNP in the last 8760 hours.  CBG: No results for input(s): GLUCAP in the last 168 hours.  Radiological Exams on Admission: Dg Chest 2 View  01/27/2015  CLINICAL DATA:  Worsening shortness of breath and cough overnight. Smoker. EXAM: CHEST  2 VIEW COMPARISON:  05/17/2012 FINDINGS: Cardiomediastinal silhouette is normal. Mediastinal contours appear intact. There is no evidence of focal airspace consolidation, pleural effusion or pneumothorax. There is bilateral peribronchial thickening. Osseous structures are without acute abnormality. Soft tissues are grossly normal. IMPRESSION: Bilateral peribronchial thickening, which may be seen with reactive airway disease or acute bronchitis. No evidence of confluent airspace consolidation. Electronically Signed   By: Fidela Salisbury M.D.   On: 01/27/2015 11:13   Ct Angio Chest Pe W/cm &/or Wo Cm  01/27/2015  CLINICAL DATA:  Cough, pleuritic chest pain, shortness of breath and fever. EXAM: CT ANGIOGRAPHY CHEST WITH CONTRAST TECHNIQUE: Multidetector CT imaging of the chest was performed using the standard protocol during bolus administration of intravenous contrast. Multiplanar CT image reconstructions and MIPs were obtained to evaluate the vascular anatomy. CONTRAST:  66m OMNIPAQUE IOHEXOL 350 MG/ML SOLN COMPARISON:  Chest x-ray earlier today. FINDINGS: The pulmonary arteries are well opacified. There is no evidence of pulmonary embolism. The thoracic aorta is also adequately opacified and shows no evidence of aneurysmal disease or dissection. The heart size is normal. There are multifocal areas of patchy and nodular airspace disease in both lungs localizing to the right upper lobe, right middle lobe, right lower lobe,  left upper lobe and lingula. Findings are suggestive of bilateral pneumonia. A more discrete 5 mm nodule is identified in the posterior right middle lobe. This nodule appears noncalcified. No enlarged lymph nodes are seen. No evidence of pleural or pericardial fluid. Visualized upper abdominal structures are unremarkable. No bony abnormalities are seen. Review of the MIP images confirms the above findings. IMPRESSION: 1. No evidence of pulmonary embolism. 2. Small patchy areas of airspace disease throughout multiple lobes in both lungs suggestive of bilateral pneumonia. Some of these areas are nodular in appearance and ill-defined. There is a more discrete 5 mm nodule in the right middle lobe. If the patient is at high risk for bronchogenic carcinoma, follow-up chest CT at 6-12 months is recommended. If the patient is at low risk for bronchogenic carcinoma, follow-up chest CT at 12 months is recommended. This recommendation follows the consensus statement: Guidelines for Management of Small Pulmonary Nodules Detected on CT Scans: A Statement from the [place] published in Radiology 2005;237:395-400. Electronically Signed   By: GAletta EdouardM.D.   On: 01/27/2015 14:04    EKG: Independently reviewed.  Assessment/Plan Principal Problem:   SIRS (systemic inflammatory response syndrome) (HCC)  Active Problems:   CAP (community acquired pneumonia)   HTN (hypertension)   Will admit to floor with IV steroids, IV ABX, IV fluids, and SVN's. Cultures sent. O2 if needed. Follow sugars and BP closely. Repeat labs and CXR in AM.  Diet: clear liquids Fluids: NS'@100'  DVT Prophylaxis: SQ Heparin  Code Status: FULL  Family Communication: none  Disposition Plan: home  Time spent: 50 min

## 2015-01-27 NOTE — ED Notes (Signed)
Called 2 more times in subwait and main lobby.  No answer.

## 2015-01-27 NOTE — Progress Notes (Signed)
ANTIBIOTIC CONSULT NOTE - INITIAL  Pharmacy Consult for Levaquin  Indication: pneumonia  Allergies  Allergen Reactions  . Azithromycin Swelling    Patient Measurements: Height: '5\' 2"'  (157.5 cm) Weight: 155 lb (70.308 kg) IBW/kg (Calculated) : 50.1 Adjusted Body Weight:   Vital Signs: Temp: 99.7 F (37.6 C) (01/22 1012) Temp Source: Oral (01/22 1012) BP: 161/85 mmHg (01/22 1500) Pulse Rate: 109 (01/22 1500) Intake/Output from previous day:   Intake/Output from this shift:    Labs:  Recent Labs  01/27/15 1030  WBC 6.2  HGB 15.3  PLT 174  CREATININE 0.92   Estimated Creatinine Clearance: 59.7 mL/min (by C-G formula based on Cr of 0.92). No results for input(s): VANCOTROUGH, VANCOPEAK, VANCORANDOM, GENTTROUGH, GENTPEAK, GENTRANDOM, TOBRATROUGH, TOBRAPEAK, TOBRARND, AMIKACINPEAK, AMIKACINTROU, AMIKACIN in the last 72 hours.   Microbiology: Recent Results (from the past 720 hour(s))  Rapid Influenza A&B Antigens ([place] only)     Status: None   Collection Time: 01/27/15  2:09 PM  Result Value Ref Range Status   Influenza A [facility]) NEGATIVE  Final   Influenza B [facility]) NEGATIVE  Final    Medical History: Past Medical History  Diagnosis Date  . Unspecified essential hypertension   . Personal history of tobacco use, presenting hazards to health   . Personal history of malignant neoplasm of breast     invasive ductal carcinoma with tubular features,Stage 1,ER pos,HER2 neg  . Cancer ([place])   . Borderline diabetes     Medications:   (Not in a hospital admission) Assessment: CrCl = 59.7 ml/min   Goal of Therapy:  resolution of infection   Plan:  Expected duration 7 days with resolution of temperature and/or normalization of WBC  Levaquin 500 mg IV Q24H ordered to start 1/22.   Allycia Pitz D 01/27/2015,3:14 PM

## 2015-01-27 NOTE — ED Notes (Addendum)
Pt report cough and chest pain, SOB that started yesterday. Increased pain with inhalation. Non productive cough. Fever of 100.5 yesterday

## 2015-01-27 NOTE — ED Provider Notes (Signed)
[facility] Emergency Department Provider Note  ____________________________________________  Time seen: Approximately 2:03 PM  I have reviewed the triage vital signs and the nursing notes.   HISTORY  Chief Complaint Chest Pain and Shortness of Breath    HPI Adriana Berg is a 60 y.o. female with history of hypertension, history of treated breast cancer now in remission for nearly 10 years who presents for evaluation of nasal congestion, chest pain, nonproductive cough, headache since yesterday, gradual onset, constant since onset, currently severe. Pain in her chest is worse with deep inspiration, movement, and palpation. He feels mildly short of breath. She has had no vomiting or diarrhea. She has had subjective fevers. Multiple sick contacts at work.   Past Medical History  Diagnosis Date  . Unspecified essential hypertension   . Personal history of tobacco use, presenting hazards to health   . Personal history of malignant neoplasm of breast     invasive ductal carcinoma with tubular features,Stage 1,ER pos,HER2 neg  . Cancer ([place])   . Borderline diabetes     Patient Active Problem List   Diagnosis Date Noted  . History of breast cancer 10/27/2012  . Personal history of colonic polyps 10/27/2012    Past Surgical History  Procedure Laterality Date  . Axillary sentinel node biopsy Right 2007  . Abdominal hysterectomy  1995  . Colonoscopy  2009  . Breast surgery      right lumpectomy    Current Outpatient Rx  Name  Route  Sig  Dispense  Refill  . quinapril (ACCUPRIL) 40 MG tablet   Oral   Take 1 tablet by mouth daily.         Marland Kitchen sulfamethoxazole-trimethoprim (BACTRIM DS,SEPTRA DS) 800-160 MG tablet   Oral   Take 1 tablet by mouth 2 (two) times daily.   14 tablet   0     Allergies Azithromycin  Family History  Problem Relation Age of Onset  . Cancer Sister     Social History Social History  Substance Use Topics  . Smoking  status: Current Every Day Smoker -- 0.50 packs/day for 20 years  . Smokeless tobacco: Never Used  . Alcohol Use: No    Review of Systems Constitutional: + subjective fever/chills Eyes: No visual changes. ENT: No sore throat. Cardiovascular: + chest pain. Respiratory: Denies shortness of breath. Gastrointestinal: No abdominal pain.  No nausea, no vomiting.  No diarrhea.  No constipation. Genitourinary: Negative for dysuria. Musculoskeletal: Negative for back pain. Skin: Negative for rash. Neurological: Positive for headaches, no focal weakness or numbness.  10-point ROS otherwise negative.  ____________________________________________   PHYSICAL EXAM:  VITAL SIGNS: ED Triage Vitals  Enc Vitals Group     BP 01/27/15 1012 191/75 mmHg     Pulse Rate 01/27/15 1012 110     Resp 01/27/15 1012 22     Temp 01/27/15 1012 99.7 F (37.6 C)     Temp Source 01/27/15 1012 Oral     SpO2 01/27/15 1012 92 %     Weight 01/27/15 1012 155 lb (70.308 kg)     Height 01/27/15 1012 _0  (1.575 m)     Head Cir --      Peak Flow --      Pain Score 01/27/15 1017 8     Pain Loc --      Pain Edu? --      Excl. in [place]? --     Constitutional: Alert and oriented. Appears fatigued and ill-appearing.  Eyes: Conjunctivae are normal. PERRL. EOMI. Head: Atraumatic. Nose: No congestion/rhinnorhea. Mouth/Throat: Mucous membranes are moist.  Oropharynx non-erythematous. Neck: No stridor.  Supple without meningismus. Cardiovascular: Tachycardic rate, regular rhythm. Grossly normal heart sounds.  Good peripheral circulation. Respiratory: Mild tachypnea with mildly increased work of breathing. Diminished breath sounds in bilateral bases. Gastrointestinal: Soft and nontender. No distention.  No CVA tenderness. Genitourinary: deferred Musculoskeletal: No lower extremity tenderness nor edema.  No joint effusions. Moderate tenderness to palpation throughout the chest wall bilaterally. Neurologic:  Normal  speech and language. No gross focal neurologic deficits are appreciated.  Skin:  Skin is warm, dry and intact. No rash noted. Psychiatric: Mood and affect are normal. Speech and behavior are normal.  ____________________________________________   LABS (all labs ordered are listed, but only abnormal results are displayed)  Labs Reviewed  BASIC METABOLIC PANEL - Abnormal; Notable for the following:    Glucose, Bld 108 (*)    All other components within normal limits  RAPID INFLUENZA A&B ANTIGENS ([facility] ONLY)  CULTURE, BLOOD (ROUTINE X 2)  CULTURE, BLOOD (ROUTINE X 2)  CBC  TROPONIN I   ____________________________________________  EKG  ED ECG REPORT I, Joanne Gavel, the attending physician, personally viewed and interpreted this ECG.   Date: 01/27/2015  EKG Time: 10:22  Rate: 100  Rhythm: normal sinus rhythm  Axis: normal  Intervals:none  ST&T Change: No acute ST elevation. T-wave inversion in V5 and V6.  ____________________________________________  RADIOLOGY  CXR IMPRESSION: Bilateral peribronchial thickening, which may be seen with reactive airway disease or acute bronchitis.  No evidence of confluent airspace consolidation.   CTA chest IMPRESSION: 1. No evidence of pulmonary embolism. 2. Small patchy areas of airspace disease throughout multiple lobes in both lungs suggestive of bilateral pneumonia. Some of these areas are nodular in appearance and ill-defined. There is a more discrete 5 mm nodule in the right middle lobe. If the patient is at high risk for bronchogenic carcinoma, follow-up chest CT at 6-12 months is recommended. If the patient is at low risk for bronchogenic carcinoma, follow-up chest CT at 12 months is recommended. This recommendation follows the consensus statement: Guidelines for Management of Small Pulmonary Nodules Detected on CT Scans: A Statement from the [place] as published in Radiology  2005;237:395-400. ____________________________________________   PROCEDURES  Procedure(s) performed: None  Critical Care performed: Yes, see critical care note(s). Total critical care time spent 35 minutes.  ____________________________________________   INITIAL IMPRESSION / ASSESSMENT AND PLAN / ED COURSE  Pertinent labs & imaging results that were available during my care of the patient were reviewed by me and considered in my medical decision making (see chart for details).  Adriana Berg is a 59 y.o. female with history of hypertension, history of treated breast cancer now in remission for nearly 10 years who presents for evaluation of nasal congestion, chest pain, nonproductive cough since yesterday. On exam, she appears fatigued and ill. She is tachycardic and tachypneic with diminished breath sounds in bilateral bases. Temp 99.7. Maintaining adequate blood pressure. CBC, BMP, troponin negative. Flu negative. Chest x-ray showed findings of bronchitis however given her persistent tachycardia and tachypnea and general ill appearance, CTA chest was sent to rule out PE. No PE found however multiple airspace opacities concerning for bilateral pneumonia are noted. The patient continues to feel poorly. Will give liberal IV fluids, Levaquin for sepsis due to CAP sepsis. Cased discussed with Dr. Doy Hutching, hopsitalist, for admission.  ____________________________________________   FINAL CLINICAL IMPRESSION(S) /  ED DIAGNOSES  Final diagnoses:  Sepsis due to pneumonia ([place])      Joanne Gavel, MD 01/27/15 [phone]

## 2015-01-28 ENCOUNTER — Inpatient Hospital Stay: Payer: Commercial Managed Care - HMO

## 2015-01-28 LAB — URINALYSIS COMPLETE WITH MICROSCOPIC (ARMC ONLY)
Bilirubin Urine: NEGATIVE
Glucose, UA: NEGATIVE mg/dL
KETONES UR: NEGATIVE mg/dL
Leukocytes, UA: NEGATIVE
Nitrite: NEGATIVE
PH: 6 (ref 5.0–8.0)
PROTEIN: NEGATIVE mg/dL
SPECIFIC GRAVITY, URINE: 1.045 — AB (ref 1.005–1.030)

## 2015-01-28 LAB — CBC
HCT: 40.4 % (ref 35.0–47.0)
Hemoglobin: 13.7 g/dL (ref 12.0–16.0)
MCH: 30.6 pg (ref 26.0–34.0)
MCHC: 33.8 g/dL (ref 32.0–36.0)
MCV: 90.3 fL (ref 80.0–100.0)
Platelets: 152 10*3/uL (ref 150–440)
RBC: 4.47 MIL/uL (ref 3.80–5.20)
RDW: 12.5 % (ref 11.5–14.5)
WBC: 5.2 10*3/uL (ref 3.6–11.0)

## 2015-01-28 LAB — COMPREHENSIVE METABOLIC PANEL
ALBUMIN: 3.6 g/dL (ref 3.5–5.0)
ALT: 18 U/L (ref 14–54)
ANION GAP: 5 (ref 5–15)
AST: 33 U/L (ref 15–41)
Alkaline Phosphatase: 49 U/L (ref 38–126)
BILIRUBIN TOTAL: 0.1 mg/dL — AB (ref 0.3–1.2)
BUN: 14 mg/dL (ref 6–20)
CHLORIDE: 112 mmol/L — AB (ref 101–111)
CO2: 23 mmol/L (ref 22–32)
Calcium: 8.2 mg/dL — ABNORMAL LOW (ref 8.9–10.3)
Creatinine, Ser: 0.67 mg/dL (ref 0.44–1.00)
GFR calc Af Amer: >60 mL/min (ref >60)
GFR calc non Af Amer: >60 mL/min (ref >60)
GLUCOSE: 126 mg/dL — AB (ref 65–99)
Potassium: 3.9 mmol/L (ref 3.5–5.1)
SODIUM: 140 mmol/L (ref 135–145)
TOTAL PROTEIN: 7.1 g/dL (ref 6.5–8.1)

## 2015-01-28 LAB — GLUCOSE, CAPILLARY
GLUCOSE-CAPILLARY: 146 mg/dL — AB (ref 65–99)
GLUCOSE-CAPILLARY: 161 mg/dL — AB (ref 65–99)
Glucose-Capillary: 107 mg/dL — ABNORMAL HIGH (ref 65–99)
Glucose-Capillary: 166 mg/dL — ABNORMAL HIGH (ref 65–99)

## 2015-01-28 LAB — BRAIN NATRIURETIC PEPTIDE: B NATRIURETIC PEPTIDE 5: 283 pg/mL — AB (ref 0.0–100.0)

## 2015-01-28 MED ORDER — IPRATROPIUM-ALBUTEROL 0.5-2.5 (3) MG/3ML IN SOLN
3.0000 mL | Freq: Three times a day (TID) | RESPIRATORY_TRACT | Status: DC
  Administered 2015-01-28 – 2015-01-30 (×5): 3 mL via RESPIRATORY_TRACT
  Filled 2015-01-28 (×5): qty 3

## 2015-01-28 MED ORDER — VANCOMYCIN HCL IN DEXTROSE 750-5 MG/150ML-% IV SOLN
750.0000 mg | Freq: Two times a day (BID) | INTRAVENOUS | Status: DC
  Administered 2015-01-28 – 2015-01-29 (×3): 750 mg via INTRAVENOUS
  Filled 2015-01-28 (×4): qty 150

## 2015-01-28 MED ORDER — PANTOPRAZOLE SODIUM 40 MG PO TBEC
40.0000 mg | DELAYED_RELEASE_TABLET | Freq: Once | ORAL | Status: DC
  Filled 2015-01-28: qty 1

## 2015-01-28 MED ORDER — NICOTINE 21 MG/24HR TD PT24
21.0000 mg | MEDICATED_PATCH | Freq: Every day | TRANSDERMAL | Status: DC
  Administered 2015-01-28 – 2015-01-30 (×3): 21 mg via TRANSDERMAL
  Filled 2015-01-28 (×3): qty 1

## 2015-01-28 MED ORDER — HYDROCOD POLST-CPM POLST ER 10-8 MG/5ML PO SUER
5.0000 mL | Freq: Two times a day (BID) | ORAL | Status: DC
Start: 2015-01-28 — End: 2015-01-30
  Administered 2015-01-28 – 2015-01-30 (×5): 5 mL via ORAL
  Filled 2015-01-28 (×5): qty 5

## 2015-01-28 MED ORDER — PIPERACILLIN-TAZOBACTAM 3.375 G IVPB
3.3750 g | Freq: Three times a day (TID) | INTRAVENOUS | Status: DC
  Administered 2015-01-28 – 2015-01-30 (×6): 3.375 g via INTRAVENOUS
  Filled 2015-01-28 (×8): qty 50

## 2015-01-28 MED ORDER — LISINOPRIL 20 MG PO TABS
40.0000 mg | ORAL_TABLET | Freq: Two times a day (BID) | ORAL | Status: DC
  Administered 2015-01-28 – 2015-01-30 (×4): 40 mg via ORAL
  Filled 2015-01-28 (×4): qty 2

## 2015-01-28 MED ORDER — NICOTINE POLACRILEX 2 MG MT GUM
2.0000 mg | CHEWING_GUM | OROMUCOSAL | Status: DC | PRN
  Filled 2015-01-28: qty 1

## 2015-01-28 NOTE — Progress Notes (Signed)
ANTIBIOTIC CONSULT NOTE - INITIAL  Pharmacy Consult for Vancomycin and Zosyn Indication: pneumonia  Allergies  Allergen Reactions  . Azithromycin Swelling    Patient Measurements: Height: _0  (157.5 cm) Weight: 156 lb 4.8 oz (70.897 kg) IBW/kg (Calculated) : 50.1 Adjusted Body Weight: 58.42 kg  Vital Signs: Temp: 98.2 F (36.8 C) (01/23 1215) Temp Source: Oral (01/23 1215) BP: 147/70 mmHg (01/23 1215) Pulse Rate: 88 (01/23 1215) Intake/Output from previous day:   Intake/Output from this shift: Total I/O In: 240 [P.O.:240] Out: -   Labs:  Recent Labs  01/27/15 1030 01/28/15 0459  WBC 6.2 5.2  HGB 15.3 13.7  PLT 174 152  CREATININE 0.92 0.67   Estimated Creatinine Clearance: 68.9 mL/min (by C-G formula based on Cr of 0.67). No results for input(s): VANCOTROUGH, VANCOPEAK, VANCORANDOM, GENTTROUGH, GENTPEAK, GENTRANDOM, TOBRATROUGH, TOBRAPEAK, TOBRARND, AMIKACINPEAK, AMIKACINTROU, AMIKACIN in the last 72 hours.   Microbiology: Recent Results (from the past 720 hour(s))  Rapid Influenza A&B Antigens ([place] only)     Status: None   Collection Time: 01/27/15  2:09 PM  Result Value Ref Range Status   Influenza A ([facility]) NEGATIVE  Final   Influenza B ([facility]) NEGATIVE  Final  Blood culture (routine x 2)     Status: None (Preliminary result)   Collection Time: 01/27/15  3:23 PM  Result Value Ref Range Status   Specimen Description BLOOD LEFT ANTECUBITAL  Final   Special Requests   Final    BOTTLES DRAWN AEROBIC AND ANAEROBIC  AER [place] ANA 3CC   Culture NO GROWTH < 24 HOURS  Final   Report Status PENDING  Incomplete  Blood culture (routine x 2)     Status: None (Preliminary result)   Collection Time: 01/27/15  3:24 PM  Result Value Ref Range Status   Specimen Description BLOOD RIGHT WRIST  Final   Special Requests BOTTLES DRAWN AEROBIC AND ANAEROBIC  5CC  Final   Culture NO GROWTH < 24 HOURS  Final   Report Status PENDING  Incomplete    Medical History: Past  Medical History  Diagnosis Date  . Unspecified essential hypertension   . Personal history of tobacco use, presenting hazards to health   . Personal history of malignant neoplasm of breast     invasive ductal carcinoma with tubular features,Stage 1,ER pos,HER2 neg  . Cancer ([place])   . Borderline diabetes     Medications:  Anti-infectives    Start     Dose/Rate Route Frequency Ordered Stop   01/28/15 1800  Levofloxacin (LEVAQUIN) IVPB 250 mg  Status:  Discontinued     250 mg 50 mL/hr over 60 Minutes Intravenous Every 24 hours 01/27/15 1513 01/27/15 1515   01/28/15 1800  levofloxacin (LEVAQUIN) IVPB 500 mg     500 mg 100 mL/hr over 60 Minutes Intravenous Every 24 hours 01/27/15 1515     01/28/15 1400  piperacillin-tazobactam (ZOSYN) IVPB 3.375 g     3.375 g 12.5 mL/hr over 240 Minutes Intravenous 3 times per day 01/28/15 0945     01/28/15 1000  vancomycin (VANCOCIN) IVPB 750 mg/150 ml premix     750 mg 150 mL/hr over 60 Minutes Intravenous Every 12 hours 01/28/15 0945     01/27/15 1515  levofloxacin (LEVAQUIN) IVPB 500 mg     500 mg 100 mL/hr over 60 Minutes Intravenous  Once 01/27/15 1513 01/27/15 1716   01/27/15 1500  levofloxacin (LEVAQUIN) IVPB 750 mg  Status:  Discontinued     750 mg  100 mL/hr over 90 Minutes Intravenous  Once 01/27/15 1450 01/27/15 1512     Assessment: Patient is a 60yo female admitted for CAP, initiated on Levaquin. Now with worsening pneumonia, MD adding Vancomycin and Zosyn to therapy.  Vancomycin pharmacokinetics: Ke=0.062 T1/2=11hr Vd=49.6 L  Goal of Therapy:  Vancomycin trough level 15-20 mcg/ml  Plan:  Measure antibiotic drug levels at steady state Follow up culture results Will order Vancomycin 715m IV q12h, stacked dosing not appropriate for this patient. Will check a trough level prior to 5th dose. Will order Zosyn 3.375g IV q8h EI.  LPaulina Fusi PharmD, BCPS 01/28/2015 5:03 PM

## 2015-01-28 NOTE — Care Management (Signed)
Admitted to [facility] with the diagnosis of Systemic Inflammatory Response. Lives with son, Amiya Tessendorf [phone]). Works at [employer]. Takes care of all basic and instrumental activities of daily living herself, drives. No falls. No seizure activity. Son will transport. Shelbie Ammons RN MSN CCM Care Management [phone]

## 2015-01-28 NOTE — Progress Notes (Signed)
[place] at [place] NAME: Adriana Berg    MR#:  [id number]  DATE OF BIRTH:  01/07/1955  SUBJECTIVE:  CHIEF COMPLAINT:   Chief Complaint  Patient presents with  . Chest Pain  . Shortness of Breath   patient is 60-year-old Caucasian female with past medical history significant history of COPD/tobacco abuse, ongoing, who presents to the hospital with complaints of shortness of breath, cough and pleuritic chest pain. In emergency room, she was noted to have low-grade fever, tachycardia, as well as tachypnea. Chest CT revealed bilateral pneumonia. Patient feels a little bit better today, however, still complains of shortness of breath, nonproductive cough, feels weak  Review of Systems  Constitutional: Negative for fever, chills and weight loss.  HENT: Negative for congestion.   Eyes: Negative for blurred vision and double vision.  Respiratory: Positive for cough and shortness of breath. Negative for sputum production and wheezing.   Cardiovascular: Negative for chest pain, palpitations, orthopnea, leg swelling and PND.  Gastrointestinal: Negative for nausea, vomiting, abdominal pain, diarrhea, constipation and blood in stool.  Genitourinary: Negative for dysuria, urgency, frequency and hematuria.  Musculoskeletal: Negative for falls.  Neurological: Negative for dizziness, tremors, focal weakness and headaches.  Endo/Heme/Allergies: Does not bruise/bleed easily.  Psychiatric/Behavioral: Negative for depression. The patient does not have insomnia.     VITAL SIGNS: Blood pressure 147/70, pulse 88, temperature 98.2 F (36.8 C), temperature source Oral, resp. rate 16, height 5\' 2"  (1.575 m), weight 70.897 kg (156 lb 4.8 oz), SpO2 93 %.  PHYSICAL EXAMINATION:   GENERAL:  60 y.o.-year-old patient lying in the bed with no acute distress.  EYES: Pupils equal, round, reactive to light and accommodation. No scleral icterus. Extraocular muscles  intact.  HEENT: Head atraumatic, normocephalic. Oropharynx and nasopharynx clear.  NECK:  Supple, no jugular venous distention. No thyroid enlargement, no tenderness.  LUNGS: Normal breath sounds bilaterally, no wheezing, rales,rhonchi , bilateral basal crepitations posteriorly. No use of accessory muscles of respiration.  CARDIOVASCULAR: S1, S2 normal. No murmurs, rubs, or gallops.  ABDOMEN: Soft, nontender, nondistended. Bowel sounds present. No organomegaly or mass.  EXTREMITIES: No pedal edema, cyanosis, or clubbing.  NEUROLOGIC: Cranial nerves II through XII are intact. Muscle strength 5/5 in all extremities. Sensation intact. Gait not checked.  PSYCHIATRIC: The patient is alert and oriented x 3.  SKIN: No obvious rash, lesion, or ulcer.   ORDERS/RESULTS REVIEWED:   CBC  Recent Labs Lab 01/27/15 1030 01/28/15 0459  WBC 6.2 5.2  HGB 15.3 13.7  HCT 45.4 40.4  PLT 174 152  MCV 90.4 90.3  MCH 30.5 30.6  MCHC 33.8 33.8  RDW 12.7 12.5   ------------------------------------------------------------------------------------------------------------------  Chemistries   Recent Labs Lab 01/27/15 1030 01/28/15 0459  NA 139 140  K 3.9 3.9  CL 102 112*  CO2 29 23  GLUCOSE 108* 126*  BUN 14 14  CREATININE 0.92 0.67  CALCIUM 9.5 8.2*  AST  --  33  ALT  --  18  ALKPHOS  --  49  BILITOT  --  0.1*   ------------------------------------------------------------------------------------------------------------------ estimated creatinine clearance is 68.9 mL/min (by C-G formula based on Cr of 0.67). ------------------------------------------------------------------------------------------------------------------ No results for input(s): TSH, T4TOTAL, T3FREE, THYROIDAB in the last 72 hours.  Invalid input(s): FREET3  Cardiac Enzymes  Recent Labs Lab 01/27/15 1030  TROPONINI <0.03    ------------------------------------------------------------------------------------------------------------------ Invalid input(s): POCBNP ---------------------------------------------------------------------------------------------------------------  RADIOLOGY: X-ray Chest Pa And Lateral  01/28/2015  CLINICAL DATA:  60 year old  female with community-acquired pneumonia and has history of breast cancer with several day history of worsening cough, pleuritic chest pain and shortness of breath. EXAM: CHEST  2 VIEW COMPARISON:  Prior chest x-ray 01/27/2015; prior chest CT 01/27/2015 FINDINGS: Stable cardiac and mediastinal contours. Increasing patchy and somewhat nodular airspace opacities in the bilateral mid lungs. There is central airway thickening and peribronchial cuffing as well. No pneumothorax or pleural effusion. No acute osseous abnormality. IMPRESSION: Progression of multifocal nodular airspace opacities bilaterally concerning for worsening multi lobar pneumonia. Electronically Signed   By: Jacqulynn Cadet M.D.   On: 01/28/2015 07:48   Dg Chest 2 View  01/27/2015  CLINICAL DATA:  Worsening shortness of breath and cough overnight. Smoker. EXAM: CHEST  2 VIEW COMPARISON:  05/17/2012 FINDINGS: Cardiomediastinal silhouette is normal. Mediastinal contours appear intact. There is no evidence of focal airspace consolidation, pleural effusion or pneumothorax. There is bilateral peribronchial thickening. Osseous structures are without acute abnormality. Soft tissues are grossly normal. IMPRESSION: Bilateral peribronchial thickening, which may be seen with reactive airway disease or acute bronchitis. No evidence of confluent airspace consolidation. Electronically Signed   By: Fidela Salisbury M.D.   On: 01/27/2015 11:13   Ct Angio Chest Pe W/cm &/or Wo Cm  01/27/2015  CLINICAL DATA:  Cough, pleuritic chest pain, shortness of breath and fever. EXAM: CT ANGIOGRAPHY CHEST WITH CONTRAST TECHNIQUE:  Multidetector CT imaging of the chest was performed using the standard protocol during bolus administration of intravenous contrast. Multiplanar CT image reconstructions and MIPs were obtained to evaluate the vascular anatomy. CONTRAST:  33mL OMNIPAQUE IOHEXOL 350 MG/ML SOLN COMPARISON:  Chest x-ray earlier today. FINDINGS: The pulmonary arteries are well opacified. There is no evidence of pulmonary embolism. The thoracic aorta is also adequately opacified and shows no evidence of aneurysmal disease or dissection. The heart size is normal. There are multifocal areas of patchy and nodular airspace disease in both lungs localizing to the right upper lobe, right middle lobe, right lower lobe, left upper lobe and lingula. Findings are suggestive of bilateral pneumonia. A more discrete 5 mm nodule is identified in the posterior right middle lobe. This nodule appears noncalcified. No enlarged lymph nodes are seen. No evidence of pleural or pericardial fluid. Visualized upper abdominal structures are unremarkable. No bony abnormalities are seen. Review of the MIP images confirms the above findings. IMPRESSION: 1. No evidence of pulmonary embolism. 2. Small patchy areas of airspace disease throughout multiple lobes in both lungs suggestive of bilateral pneumonia. Some of these areas are nodular in appearance and ill-defined. There is a more discrete 5 mm nodule in the right middle lobe. If the patient is at high risk for bronchogenic carcinoma, follow-up chest CT at 6-12 months is recommended. If the patient is at low risk for bronchogenic carcinoma, follow-up chest CT at 12 months is recommended. This recommendation follows the consensus statement: Guidelines for Management of Small Pulmonary Nodules Detected on CT Scans: A Statement from the [facility] as published in Radiology 2005;237:395-400. Electronically Signed   By: Aletta Edouard M.D.   On: 01/27/2015 14:04    EKG:  Orders placed or performed during  the hospital encounter of 01/27/15  . EKG 12-Lead  . EKG 12-Lead  . ED EKG within 10 minutes  . ED EKG within 10 minutes    ASSESSMENT AND PLAN:  Principal Problem:   SIRS (systemic inflammatory response syndrome) (HCC) Active Problems:   CAP (community acquired pneumonia)   HTN (hypertension)  #1 SIRS due  to community acquired pneumonia, chest x-ray reveals worsening multilobar pneumonia today, continue levofloxacin at the vancomycin as well as Zosyn, get sputum cultures if possible. Influenza test was negative and blood cultures so far are negative as well. Get BNP #2. Multilobar pneumonia, continue broad-spectrum antibody therapy. Get sputum cultures #3. Hypertension, essential, advance lisinopril to 40 mg twice daily dose, following closely #4. Tobacco abuse. Discussed with the patient and her family , nicotine replacement therapy was recommended, Initiated. Discussed for approximately 4-5 minutes   Management plans discussed with the patient, family and they are in agreement.   DRUG ALLERGIES:  Allergies  Allergen Reactions  . Azithromycin Swelling    CODE STATUS:     Code Status Orders        Start     Ordered   01/27/15 1716  Full code   Continuous     01/27/15 1715    Code Status History    Date Active Date Inactive Code Status Order ID Comments User Context   This patient has a current code status but no historical code status.      TOTAL TIME TAKING CARE OF THIS PATIENT: 45 minutes.  Prolonged discussion of the patient's treatment plan, discharge planning with patient and family , time spent approximately 15 minutes. On discussions   Valora Norell M.D on 01/28/2015 at 3:10 PM  Between 7am to 6pm - Pager - [phone]  After 6pm go to [email or web address] - password [email or web address] [facility]  Office  [phone]  CC: Primary care physician; Baltazar Apo, MD

## 2015-01-29 LAB — GLUCOSE, CAPILLARY
GLUCOSE-CAPILLARY: 135 mg/dL — AB (ref 65–99)
Glucose-Capillary: 118 mg/dL — ABNORMAL HIGH (ref 65–99)
Glucose-Capillary: 121 mg/dL — ABNORMAL HIGH (ref 65–99)
Glucose-Capillary: 184 mg/dL — ABNORMAL HIGH (ref 65–99)

## 2015-01-29 MED ORDER — ENOXAPARIN SODIUM 40 MG/0.4ML ~~LOC~~ SOLN
40.0000 mg | SUBCUTANEOUS | Status: DC
  Administered 2015-01-29: 20:00:00 40 mg via SUBCUTANEOUS
  Filled 2015-01-29 (×2): qty 0.4

## 2015-01-29 MED ORDER — MECLIZINE HCL 25 MG PO TABS
25.0000 mg | ORAL_TABLET | Freq: Three times a day (TID) | ORAL | Status: DC | PRN
  Filled 2015-01-29: qty 1

## 2015-01-29 NOTE — Plan of Care (Signed)
Problem: Education: Goal: Knowledge of  General Education information/materials will improve Outcome: Progressing Pt is alert and oriented, independent. Son at bedside, supportive. No c/o pain at this time. Continue with IV antibiotics.  Moderate fall, encouraged to call for assistance when needed.

## 2015-01-29 NOTE — Progress Notes (Signed)
[place] at [place] NAME: Adriana Berg    MR#:  [id number]  DATE OF BIRTH:  06-23-1955  SUBJECTIVE:  CHIEF COMPLAINT:   Chief Complaint  Patient presents with  . Chest Pain  . Shortness of Breath   patient is 60-year-old Caucasian female with past medical history significant history of COPD/tobacco abuse, ongoing, who presents to the hospital with complaints of shortness of breath, cough and pleuritic chest pain. In emergency room, she was noted to have low-grade fever, tachycardia, as well as tachypnea. Chest CT revealed bilateral pneumonia. She feels better today. She is able to produce some phlegm, less shortness of breath, stronger Review of Systems  Constitutional: Negative for fever, chills and weight loss.  HENT: Negative for congestion.   Eyes: Negative for blurred vision and double vision.  Respiratory: Positive for cough and shortness of breath. Negative for sputum production and wheezing.   Cardiovascular: Negative for chest pain, palpitations, orthopnea, leg swelling and PND.  Gastrointestinal: Negative for nausea, vomiting, abdominal pain, diarrhea, constipation and blood in stool.  Genitourinary: Negative for dysuria, urgency, frequency and hematuria.  Musculoskeletal: Negative for falls.  Neurological: Negative for dizziness, tremors, focal weakness and headaches.  Endo/Heme/Allergies: Does not bruise/bleed easily.  Psychiatric/Behavioral: Negative for depression. The patient does not have insomnia.     VITAL SIGNS: Blood pressure 139/74, pulse 81, temperature 98.9 F (37.2 C), temperature source Oral, resp. rate 18, height 5\' 2"  (1.575 m), weight 71.305 kg (157 lb 3.2 oz), SpO2 100 %.  PHYSICAL EXAMINATION:   GENERAL:  60 y.o.-year-old patient lying in the bed with no acute distress.  EYES: Pupils equal, round, reactive to light and accommodation. No scleral icterus. Extraocular muscles intact.  HEENT: Head  atraumatic, normocephalic. Oropharynx and nasopharynx clear.  NECK:  Supple, no jugular venous distention. No thyroid enlargement, no tenderness.  LUNGS: Normal breath sounds bilaterally, no wheezing, rales,rhonchi , few scattered crepitations posteriorly. No use of accessory muscles of respiration.  CARDIOVASCULAR: S1, S2 normal. No murmurs, rubs, or gallops.  ABDOMEN: Soft, nontender, nondistended. Bowel sounds present. No organomegaly or mass.  EXTREMITIES: No pedal edema, cyanosis, or clubbing.  NEUROLOGIC: Cranial nerves II through XII are intact. Muscle strength 5/5 in all extremities. Sensation intact. Gait not checked.  PSYCHIATRIC: The patient is alert and oriented x 3.  SKIN: No obvious rash, lesion, or ulcer.   ORDERS/RESULTS REVIEWED:   CBC  Recent Labs Lab 01/27/15 1030 01/28/15 0459  WBC 6.2 5.2  HGB 15.3 13.7  HCT 45.4 40.4  PLT 174 152  MCV 90.4 90.3  MCH 30.5 30.6  MCHC 33.8 33.8  RDW 12.7 12.5   ------------------------------------------------------------------------------------------------------------------  Chemistries   Recent Labs Lab 01/27/15 1030 01/28/15 0459  NA 139 140  K 3.9 3.9  CL 102 112*  CO2 29 23  GLUCOSE 108* 126*  BUN 14 14  CREATININE 0.92 0.67  CALCIUM 9.5 8.2*  AST  --  33  ALT  --  18  ALKPHOS  --  49  BILITOT  --  0.1*   ------------------------------------------------------------------------------------------------------------------ estimated creatinine clearance is 69.2 mL/min (by C-G formula based on Cr of 0.67). ------------------------------------------------------------------------------------------------------------------ No results for input(s): TSH, T4TOTAL, T3FREE, THYROIDAB in the last 72 hours.  Invalid input(s): FREET3  Cardiac Enzymes  Recent Labs Lab 01/27/15 1030  TROPONINI <0.03    ------------------------------------------------------------------------------------------------------------------ Invalid input(s): POCBNP ---------------------------------------------------------------------------------------------------------------  RADIOLOGY: X-ray Chest Pa And Lateral  01/28/2015  CLINICAL DATA:  60-year-old patient with community-acquired  pneumonia and has history of breast cancer with several day history of worsening cough, pleuritic chest pain and shortness of breath. EXAM: CHEST  2 VIEW COMPARISON:  Prior chest x-ray 01/27/2015; prior chest CT 01/27/2015 FINDINGS: Stable cardiac and mediastinal contours. Increasing patchy and somewhat nodular airspace opacities in the bilateral mid lungs. There is central airway thickening and peribronchial cuffing as well. No pneumothorax or pleural effusion. No acute osseous abnormality. IMPRESSION: Progression of multifocal nodular airspace opacities bilaterally concerning for worsening multi lobar pneumonia. Electronically Signed   By: Jacqulynn Cadet M.D.   On: 01/28/2015 07:48    EKG:  Orders placed or performed during the hospital encounter of 01/27/15  . EKG 12-Lead  . EKG 12-Lead  . ED EKG within 10 minutes  . ED EKG within 10 minutes    ASSESSMENT AND PLAN:  Principal Problem:   SIRS (systemic inflammatory response syndrome) (HCC) Active Problems:   CAP (community acquired pneumonia)   HTN (hypertension)  #1 SIRS due to community acquired pneumonia, chest x-ray revealed worsening multilobar pneumonia yesterday, continue levofloxacin  as well as Zosyn, awaiting for sputum cultures . Influenza test was negative and blood cultures so far are negative as well. BNP was mildly elevated at 283. Will check chest x-ray tomorrow morning and if patient is improved, may be able to discharge her home on levofloxacin.  #2. Multilobar pneumonia, continue broad-spectrum antibody therapy. Awaiting for sputum cultures #3. Hypertension,  essential, blood pressure somewhat improved with advancement of  lisinopril to 40 mg twice daily dose, following closely #4. Tobacco abuse. Discussed with the patient and her family yesterday , nicotine replacement therapy was initiated, comfortable  Management plans discussed with the patient, family and they are in agreement.   DRUG ALLERGIES:  Allergies  Allergen Reactions  . Azithromycin Swelling    CODE STATUS:     Code Status Orders        Start     Ordered   01/27/15 1716  Full code   Continuous     01/27/15 1715    Code Status History    Date Active Date Inactive Code Status Order ID Comments User Context   This patient has a current code status but no historical code status.      TOTAL TIME TAKING CARE OF THIS PATIENT: 35 minutes.   Theodoro Grist M.D on 01/29/2015 at 3:42 PM  Between 7am to 6pm - Pager - [phone]  After 6pm go to [email or web address] - password [email or web address] [place] [facility]  Office  [phone]  CC: Primary care physician; Baltazar Apo, MD

## 2015-01-30 ENCOUNTER — Inpatient Hospital Stay: Payer: Commercial Managed Care - HMO

## 2015-01-30 DIAGNOSIS — Z720 Tobacco use: Secondary | ICD-10-CM

## 2015-01-30 LAB — GLUCOSE, CAPILLARY
GLUCOSE-CAPILLARY: 152 mg/dL — AB (ref 65–99)
GLUCOSE-CAPILLARY: 113 mg/dL — AB (ref 65–99)

## 2015-01-30 LAB — CREATININE, SERUM
CREATININE: 0.93 mg/dL (ref 0.44–1.00)
GFR calc Af Amer: >60 mL/min (ref >60)

## 2015-01-30 LAB — POCT CBG MONITORING: POCT Glucose (KUC): 152 mg/dL — AB (ref 70–99)

## 2015-01-30 MED ORDER — PREDNISONE 10 MG (21) PO TBPK: 10.0000 mg | ORAL_TABLET | Freq: Every day | ORAL | Status: DC

## 2015-01-30 MED ORDER — HYDROCOD POLST-CPM POLST ER 10-8 MG/5ML PO SUER: 5.0000 mL | Freq: Two times a day (BID) | ORAL | Status: DC

## 2015-01-30 MED ORDER — LEVOFLOXACIN 750 MG PO TABS: 750.0000 mg | ORAL_TABLET | Freq: Every day | ORAL | Status: DC

## 2015-01-30 MED ORDER — GUAIFENESIN ER 600 MG PO TB12: 600.0000 mg | ORAL_TABLET | Freq: Two times a day (BID) | ORAL | Status: DC

## 2015-01-30 MED ORDER — IPRATROPIUM-ALBUTEROL 0.5-2.5 (3) MG/3ML IN SOLN: 3.0000 mL | Freq: Three times a day (TID) | RESPIRATORY_TRACT | Status: DC

## 2015-01-30 MED ORDER — NICOTINE 21 MG/24HR TD PT24: 21.0000 mg | MEDICATED_PATCH | Freq: Every day | TRANSDERMAL | Status: DC

## 2015-01-30 NOTE — Progress Notes (Signed)
          To Whom it  May Concern:      Mrs. Adriana Berg, Adriana Berg was hospitalized at [facility] from January 22nd through January 25th 2017. She is to return to work and usual activities on January 31st 2017. Thank you for your understanding.         Sincerely,   Theodoro Grist, MD

## 2015-01-30 NOTE — Discharge Summary (Signed)
[facility] [place] [facility] [place] NAME: Adriana Berg    MR#:  [id number]  DATE OF BIRTH:  02/16/1955  DATE OF ADMISSION:  01/27/2015 ADMITTING PHYSICIAN: Idelle Crouch, MD  DATE OF DISCHARGE: 01/30/2015  1:25 PM  PRIMARY CARE PHYSICIAN: Baltazar Apo, MD     ADMISSION DIAGNOSIS:  Sepsis due to pneumonia ([place]) [J18.9, A41.9]  DISCHARGE DIAGNOSIS:  Principal Problem:   SIRS (systemic inflammatory response syndrome) (HCC) Active Problems:   CAP (community acquired pneumonia)   Tobacco abuse   HTN (hypertension)   SECONDARY DIAGNOSIS:   Past Medical History  Diagnosis Date  . Unspecified essential hypertension   . Personal history of tobacco use, presenting hazards to health   . Personal history of malignant neoplasm of breast     invasive ductal carcinoma with tubular features,Stage 1,ER pos,HER2 neg  . Cancer ([place])   . Borderline diabetes     .pro HOSPITAL COURSE:  The patient is 60 year old African-American female with past medical history significant history of COPD/tobacco abuse, ongoing, who presents to the hospital with complaints of shortness of breath, cough and pleuritic chest pain. In emergency room, she was noted to have low-grade fever, tachycardia, as well as tachypnea. Chest CT revealed bilateral pneumonia. The patient was initiated on broad-spectrum antibiotic therapy, steroids, inhalation therapy, nebulizers and her condition improved. While in the hospital, she underwent CT angiogram of her chest revealing no pulmonary embolism, however, small patchy areas of airspace disease throughout multiple lobes in both lungs suggestive of bilateral pneumonia, follow-up chest CT in 6- 12 months was recommended. Patient's condition progressively improved and she was felt to be stable to be discharged home. Discussion by problem #1 SIRS due to community acquired pneumonia, chest x-ray revealed worsening multilobar pneumonia , patient was  continued on broad-spectrum antibiotic therapy while she was in the hospital. However, since her chest x-ray improved. She is being discharged home on only levofloxacin to continue high dose 750 mg by mouth daily for 5 days.  Blood cultures remained negative for 3 days, sputum cultures were not reported by the day of discharge . Influenza test was negative #2. Multilobar pneumonia, continue levofloxacin for 5 days, steroid taper, DuoNeb nebs, please follow-up with sputum cultures if patient's condition changes #3. Hypertension, essential, blood pressure somewhat improved with advancement of lisinopril to 40 mg twice daily dose, but patient is not being discharged on lisinopril 40 mg twice a day, due to concerns of hypotension at home. It is recommended to follow patient's blood pressure closely as outpatient and advance medications as needed if her blood pressure remains high in outpatient settings #4. Tobacco abuse. Discussed with the patient and her family yesterday , nicotine replacement therapy was initiated, to be continued   DISCHARGE CONDITIONS:   Stable  CONSULTS OBTAINED:     DRUG ALLERGIES:   Allergies  Allergen Reactions  . Azithromycin Swelling    DISCHARGE MEDICATIONS:   Discharge Medication List as of 01/30/2015  9:51 AM    START taking these medications   Details  chlorpheniramine-HYDROcodone (TUSSIONEX) 10-8 MG/5ML SUER Take 5 mLs by mouth every 12 (twelve) hours., Starting 01/30/2015, Until Discontinued, Normal    guaiFENesin (MUCINEX) 600 MG 12 hr tablet Take 1 tablet (600 mg total) by mouth 2 (two) times daily., Starting 01/30/2015, Until Discontinued, Normal    ipratropium-albuterol (DUONEB) 0.5-2.5 (3) MG/3ML SOLN Take 3 mLs by nebulization 3 (three) times daily., Starting 01/30/2015, Until Discontinued, Normal    levofloxacin (  LEVAQUIN) 750 MG tablet Take 1 tablet (750 mg total) by mouth daily., Starting 01/30/2015, Until Discontinued, Normal    nicotine (NICODERM  CQ - DOSED IN MG/24 HOURS) 21 mg/24hr patch Place 1 patch (21 mg total) onto the skin daily., Starting 01/30/2015, Until Discontinued, Normal    predniSONE (STERAPRED UNI-PAK 21 TAB) 10 MG (21) TBPK tablet Take 1 tablet (10 mg total) by mouth daily. Lease take 6 pills in the morning on the day 1, then taper by one pill daily until finished. Thank you, Starting 01/30/2015, Until Discontinued, Normal      CONTINUE these medications which have NOT CHANGED   Details  quinapril (ACCUPRIL) 40 MG tablet Take 1 tablet by mouth every morning. , Starting 09/02/2012, Until Discontinued, Historical Med    tretinoin (RETIN-A) 0.025 % cream Apply 1 application topically at bedtime. Apply pea-sized amount to entire face., Starting 12/19/2014, Until Discontinued, Historical Med         DISCHARGE INSTRUCTIONS:    Patient is to follow-up with primary care physician within one week  If you experience worsening of your admission symptoms, develop shortness of breath, life threatening emergency, suicidal or homicidal thoughts you must seek medical attention immediately by calling 911 or calling your MD immediately  if symptoms less severe.  You Must read complete instructions/literature along with all the possible adverse reactions/side effects for all the Medicines you take and that have been prescribed to you. Take any new Medicines after you have completely understood and accept all the possible adverse reactions/side effects.   Please note  You were cared for by a hospitalist during your hospital stay. If you have any questions about your discharge medications or the care you received while you were in the hospital after you are discharged, you can call the unit and asked to speak with the hospitalist on call if the hospitalist that took care of you is not available. Once you are discharged, your primary care physician will handle any further medical issues. Please note that NO REFILLS for any discharge  medications will be authorized once you are discharged, as it is imperative that you return to your primary care physician (or establish a relationship with a primary care physician if you do not have one) for your aftercare needs so that they can reassess your need for medications and monitor your lab values.    Today   CHIEF COMPLAINT:   Chief Complaint  Patient presents with  . Chest Pain  . Shortness of Breath    HISTORY OF PRESENT ILLNESS:  Adriana Berg  is a 60 y.o. female with a known history of COPD/tobacco abuse, ongoing, who presents to the hospital with complaints of shortness of breath, cough and pleuritic chest pain. In emergency room, she was noted to have low-grade fever, tachycardia, as well as tachypnea. Chest CT revealed bilateral pneumonia. The patient was initiated on broad-spectrum antibiotic therapy, steroids, inhalation therapy, nebulizers and her condition improved. While in the hospital, she underwent CT angiogram of her chest revealing no pulmonary embolism, however, small patchy areas of airspace disease throughout multiple lobes in both lungs suggestive of bilateral pneumonia, follow-up chest CT in 6- 12 months was recommended. Patient's condition progressively improved and she was felt to be stable to be discharged home. Discussion by problem #1 SIRS due to community acquired pneumonia, chest x-ray revealed worsening multilobar pneumonia , patient was continued on broad-spectrum antibiotic therapy while she was in the hospital. However, since her chest x-ray improved. She is  being discharged home on only levofloxacin to continue high dose 750 mg by mouth daily for 5 days.  Blood cultures remained negative for 3 days, sputum cultures were not reported by the day of discharge . Influenza test was negative #2. Multilobar pneumonia, continue levofloxacin for 5 days, steroid taper, DuoNeb nebs, please follow-up with sputum cultures if patient's condition changes #3.  Hypertension, essential, blood pressure somewhat improved with advancement of lisinopril to 40 mg twice daily dose, but patient is not being discharged on lisinopril 40 mg twice a day, due to concerns of hypotension at home. It is recommended to follow patient's blood pressure closely as outpatient and advance medications as needed if her blood pressure remains high in outpatient settings #4. Tobacco abuse. Discussed with the patient and her family yesterday , nicotine replacement therapy was initiated, to be continued    VITAL SIGNS:  Blood pressure 150/77, pulse 60, temperature 97.9 F (36.6 C), temperature source Oral, resp. rate 20, height '5\' 2"'  (1.575 m), weight 70.308 kg (155 lb), SpO2 97 %.  I/O:   Intake/Output Summary (Last 24 hours) at 01/30/15 1350 Last data filed at 01/30/15 0900  Gross per 24 hour  Intake    480 ml  Output      0 ml  Net    480 ml    PHYSICAL EXAMINATION:  GENERAL:  60 y.o.-year-old patient lying in the bed with no acute distress.  EYES: Pupils equal, round, reactive to light and accommodation. No scleral icterus. Extraocular muscles intact.  HEENT: Head atraumatic, normocephalic. Oropharynx and nasopharynx clear.  NECK:  Supple, no jugular venous distention. No thyroid enlargement, no tenderness.  LUNGS: Normal breath sounds bilaterally, no wheezing, rales,rhonchi or crepitation. No use of accessory muscles of respiration.  CARDIOVASCULAR: S1, S2 normal. No murmurs, rubs, or gallops.  ABDOMEN: Soft, non-tender, non-distended. Bowel sounds present. No organomegaly or mass.  EXTREMITIES: No pedal edema, cyanosis, or clubbing.  NEUROLOGIC: Cranial nerves II through XII are intact. Muscle strength 5/5 in all extremities. Sensation intact. Gait not checked.  PSYCHIATRIC: The patient is alert and oriented x 3.  SKIN: No obvious rash, lesion, or ulcer.   DATA REVIEW:   CBC  Recent Labs Lab 01/28/15 0459  WBC 5.2  HGB 13.7  HCT 40.4  PLT 152     Chemistries   Recent Labs Lab 01/28/15 0459 01/30/15 0939  NA 140  --   K 3.9  --   CL 112*  --   CO2 23  --   GLUCOSE 126*  --   BUN 14  --   CREATININE 0.67 0.93  CALCIUM 8.2*  --   AST 33  --   ALT 18  --   ALKPHOS 49  --   BILITOT 0.1*  --     Cardiac Enzymes  Recent Labs Lab 01/27/15 1030  TROPONINI <0.03    Microbiology Results  Results for orders placed or performed during the hospital encounter of 01/27/15  Rapid Influenza A&B Antigens ([place] only)     Status: None   Collection Time: 01/27/15  2:09 PM  Result Value Ref Range Status   Influenza A ([place]) NEGATIVE  Final   Influenza B ([facility]) NEGATIVE  Final  Blood culture (routine x 2)     Status: None (Preliminary result)   Collection Time: 01/27/15  3:23 PM  Result Value Ref Range Status   Specimen Description BLOOD LEFT ANTECUBITAL  Final   Special Requests   Final  BOTTLES DRAWN AEROBIC AND ANAEROBIC  AER [place] ANA 3CC   Culture NO GROWTH 3 DAYS  Final   Report Status PENDING  Incomplete  Blood culture (routine x 2)     Status: None (Preliminary result)   Collection Time: 01/27/15  3:24 PM  Result Value Ref Range Status   Specimen Description BLOOD RIGHT WRIST  Final   Special Requests BOTTLES DRAWN AEROBIC AND ANAEROBIC  5CC  Final   Culture NO GROWTH 3 DAYS  Final   Report Status PENDING  Incomplete    RADIOLOGY:  Dg Chest 2 View  01/30/2015  CLINICAL DATA:  Shortness of breath and chest pain; COPD, current smoker a, history of breast malignancy. EXAM: CHEST  2 VIEW COMPARISON:  PA and lateral chest x-ray of January 28, 2015 FINDINGS: The right lung is adequately inflated. The left lung is slightly less well inflated. There is persistent infiltrate or subsegmental atelectasis on the left. Densities on the right have nearly totally resolved. The heart is mildly enlarged. The pulmonary vascularity is not engorged. IMPRESSION: There has been some interval improvement in the appearance of the right  lung. Persistent patchy nodular density is present on the left. There is no pleural effusion. Electronically Signed   By: David  Martinique M.D.   On: 01/30/2015 07:48    EKG:   Orders placed or performed during the hospital encounter of 01/27/15  . EKG 12-Lead  . EKG 12-Lead  . ED EKG within 10 minutes  . ED EKG within 10 minutes      Management plans discussed with the patient, family and they are in agreement.  CODE STATUS:     Code Status Orders        Start     Ordered   01/27/15 1716  Full code   Continuous     01/27/15 1715    Code Status History    Date Active Date Inactive Code Status Order ID Comments User Context   This patient has a current code status but no historical code status.      TOTAL TIME TAKING CARE OF THIS PATIENT: 40 minutes.    Theodoro Grist M.D on 01/30/2015 at 1:50 PM  Between 7am to 6pm - Pager - [phone]  After 6pm go to [email or web address] - password [email or web address] [facility]  Office  [phone]  CC: Primary care physician; Baltazar Apo, MD

## 2015-01-30 NOTE — Progress Notes (Signed)
Discussed discharge instructions and medications with pt and her boyfriend.  Pt's IVs removed.  Work note and Rx given.  Pt made aware of meds at [place] and her follow up appointment.  Pt transported home via car by boyfriend.  Clarise Cruz, RN

## 2015-01-30 NOTE — Plan of Care (Addendum)
Problem: Discharge Progression Outcomes Goal: Dyspnea controlled Outcome: Progressing -Dyspnea controlled, clear lung sounds -VSS including O2 stable on room air. -Zosyn, Solumedrol & Duonebs given as scheduled  -No c/o pain -No BM since 1/21, colace given as scheduled

## 2015-02-01 LAB — CULTURE, BLOOD (ROUTINE X 2)
Culture: NO GROWTH
Culture: NO GROWTH

## 2015-02-05 ENCOUNTER — Other Ambulatory Visit: Payer: Self-pay | Admitting: *Deleted

## 2015-02-05 DIAGNOSIS — Z853 Personal history of malignant neoplasm of breast: Secondary | ICD-10-CM

## 2015-02-06 DIAGNOSIS — J189 Pneumonia, unspecified organism: Secondary | ICD-10-CM

## 2015-02-06 HISTORY — DX: Pneumonia, unspecified organism: J18.9

## 2015-02-13 ENCOUNTER — Encounter: Payer: Self-pay | Admitting: *Deleted

## 2015-03-12 ENCOUNTER — Emergency Department
Admission: EM | Admit: 2015-03-12 | Discharge: 2015-03-12 | Disposition: A | Discharge status: Home / Self Care | Payer: 59 | Attending: Emergency Medicine | Admitting: Emergency Medicine

## 2015-03-12 ENCOUNTER — Encounter: Payer: Self-pay | Admitting: Emergency Medicine

## 2015-03-12 DIAGNOSIS — R319 Hematuria, unspecified: Secondary | ICD-10-CM | POA: Diagnosis present

## 2015-03-12 DIAGNOSIS — Z792 Long term (current) use of antibiotics: Secondary | ICD-10-CM | POA: Insufficient documentation

## 2015-03-12 DIAGNOSIS — F17210 Nicotine dependence, cigarettes, uncomplicated: Secondary | ICD-10-CM | POA: Insufficient documentation

## 2015-03-12 DIAGNOSIS — N390 Urinary tract infection, site not specified: Secondary | ICD-10-CM | POA: Diagnosis not present

## 2015-03-12 DIAGNOSIS — Z79899 Other long term (current) drug therapy: Secondary | ICD-10-CM | POA: Diagnosis not present

## 2015-03-12 DIAGNOSIS — R300 Dysuria: Secondary | ICD-10-CM

## 2015-03-12 DIAGNOSIS — I10 Essential (primary) hypertension: Secondary | ICD-10-CM | POA: Diagnosis not present

## 2015-03-12 LAB — URINALYSIS COMPLETE WITH MICROSCOPIC (ARMC ONLY)
BILIRUBIN URINE: NEGATIVE
Bacteria, UA: NONE SEEN
GLUCOSE, UA: NEGATIVE mg/dL
KETONES UR: NEGATIVE mg/dL
Nitrite: NEGATIVE
PH: 5 (ref 5.0–8.0)
Protein, ur: NEGATIVE mg/dL
Specific Gravity, Urine: 1.018 (ref 1.005–1.030)

## 2015-03-12 MED ORDER — PHENAZOPYRIDINE HCL 200 MG PO TABS
200.0000 mg | ORAL_TABLET | Freq: Once | ORAL | Status: AC
  Administered 2015-03-12: 200 mg via ORAL
  Filled 2015-03-12: qty 1

## 2015-03-12 MED ORDER — CIPROFLOXACIN HCL 500 MG PO TABS
500.0000 mg | ORAL_TABLET | Freq: Once | ORAL | Status: AC
  Administered 2015-03-12: 500 mg via ORAL
  Filled 2015-03-12: qty 1

## 2015-03-12 MED ORDER — PHENAZOPYRIDINE HCL 200 MG PO TABS: 200.0000 mg | ORAL_TABLET | Freq: Three times a day (TID) | ORAL | Status: DC | PRN

## 2015-03-12 MED ORDER — CIPROFLOXACIN HCL 500 MG PO TABS: 500.0000 mg | ORAL_TABLET | Freq: Two times a day (BID) | ORAL | Status: DC

## 2015-03-12 NOTE — ED Notes (Signed)
Pt discharged to home.  Discharge instructions reviewed.  Verbalized understanding.  No questions or concerns at this time.  Teach back verified.  Pt in NAD.  No items left in ED.   

## 2015-03-12 NOTE — ED Provider Notes (Signed)
[facility] Emergency Department Provider Note  ____________________________________________  Time seen: Approximately 5:55 AM  I have reviewed the triage vital signs and the nursing notes.   HISTORY  Chief Complaint Hematuria and Abdominal Pain    HPI Adriana Berg is a 60 y.o. female who presents to the ED from home with a chief complaint of dysuria and hematuria. Patient states she saw blood in her urine 2 days ago and that it burns badly when she voids. Also complains of suprapubic and left lower quadrant pain. Reports similar symptoms with UTI. Denies associated fever, chills, chest pain, shortness of breath, nausea, vomiting, diarrhea. Nothing makes her pain better or worse. No prior history of kidney stones.   Past Medical History  Diagnosis Date  . Unspecified essential hypertension   . Personal history of tobacco use, presenting hazards to health   . Personal history of malignant neoplasm of breast     invasive ductal carcinoma with tubular features,Stage 1,ER pos,HER2 neg  . Cancer ([place])   . Borderline diabetes     Patient Active Problem List   Diagnosis Date Noted  . Tobacco abuse 01/30/2015  . CAP (community acquired pneumonia) 01/27/2015  . SIRS (systemic inflammatory response syndrome) ([place]) 01/27/2015  . HTN (hypertension) 01/27/2015  . History of breast cancer 10/27/2012  . Personal history of colonic polyps 10/27/2012    Past Surgical History  Procedure Laterality Date  . Axillary sentinel node biopsy Right 2007  . Abdominal hysterectomy  1995  . Colonoscopy  2009  . Breast surgery      right lumpectomy    Current Outpatient Rx  Name  Route  Sig  Dispense  Refill  . chlorpheniramine-HYDROcodone (TUSSIONEX) 10-8 MG/5ML SUER   Oral   Take 5 mLs by mouth every 12 (twelve) hours.   140 mL   0   . guaiFENesin (MUCINEX) 600 MG 12 hr tablet   Oral   Take 1 tablet (600 mg total) by mouth 2 (two) times daily.   20 tablet    0   . ipratropium-albuterol (DUONEB) 0.5-2.5 (3) MG/3ML SOLN   Nebulization   Take 3 mLs by nebulization 3 (three) times daily.   360 mL   6   . levofloxacin (LEVAQUIN) 750 MG tablet   Oral   Take 1 tablet (750 mg total) by mouth daily.   5 tablet   0   . nicotine (NICODERM CQ - DOSED IN MG/24 HOURS) 21 mg/24hr patch   Transdermal   Place 1 patch (21 mg total) onto the skin daily.   28 patch   0   . predniSONE (STERAPRED UNI-PAK 21 TAB) 10 MG (21) TBPK tablet   Oral   Take 1 tablet (10 mg total) by mouth daily. Lease take 6 pills in the morning on the day 1, then taper by one pill daily until finished. Thank you   21 tablet   0   . quinapril (ACCUPRIL) 40 MG tablet   Oral   Take 1 tablet by mouth every morning.          . tretinoin (RETIN-A) 0.025 % cream   Topical   Apply 1 application topically at bedtime. Apply pea-sized amount to entire face.      5     Allergies Azithromycin  Family History  Problem Relation Age of Onset  . Cancer Sister   . Breast cancer Sister   . Diabetes Mellitus II Mother   . CAD Father   .  Stroke Brother     Social History Social History  Substance Use Topics  . Smoking status: Current Some Day Smoker -- 0.50 packs/day for 20 years    Types: Cigarettes  . Smokeless tobacco: Never Used  . Alcohol Use: No    Review of Systems  Constitutional: No fever/chills. Eyes: No visual changes. ENT: No sore throat. Cardiovascular: Denies chest pain. Respiratory: Denies shortness of breath. Gastrointestinal: Positive abdominal pain.  No nausea, no vomiting.  No diarrhea.  No constipation. Genitourinary: Positive for dysuria and hematuria. Musculoskeletal: Negative for back pain. Skin: Negative for rash. Neurological: Negative for headaches, focal weakness or numbness.  10-point ROS otherwise negative.  ____________________________________________   PHYSICAL EXAM:  VITAL SIGNS: ED Triage Vitals  Enc Vitals Group     BP  03/12/15 0113 174/88 mmHg     Pulse Rate 03/12/15 0113 72     Resp 03/12/15 0113 18     Temp 03/12/15 0113 98.1 F (36.7 C)     Temp Source 03/12/15 0113 Oral     SpO2 03/12/15 0113 99 %     Weight 03/12/15 0113 157 lb (71.215 kg)     Height 03/12/15 0113 '5\' 3"'  (1.6 m)     Head Cir --      Peak Flow --      Pain Score 03/12/15 0113 8     Pain Loc --      Pain Edu? --      Excl. in [place]? --     Constitutional: Alert and oriented. Well appearing and in no acute distress. Eyes: Conjunctivae are normal. PERRL. EOMI. Head: Atraumatic. Nose: No congestion/rhinnorhea. Mouth/Throat: Mucous membranes are moist.  Oropharynx non-erythematous. Neck: No stridor.   Cardiovascular: Normal rate, regular rhythm. Grossly normal heart sounds.  Good peripheral circulation. Respiratory: Normal respiratory effort.  No retractions. Lungs CTAB. Gastrointestinal: Soft and nontender. No distention. No abdominal bruits. No CVA tenderness. Musculoskeletal: No lower extremity tenderness nor edema.  No joint effusions. Neurologic:  Normal speech and language. No gross focal neurologic deficits are appreciated. No gait instability. Skin:  Skin is warm, dry and intact. No rash noted. Psychiatric: Mood and affect are normal. Speech and behavior are normal.  ____________________________________________   LABS (all labs ordered are listed, but only abnormal results are displayed)  Labs Reviewed  URINALYSIS COMPLETEWITH MICROSCOPIC ([place]) - Abnormal; Notable for the following:    Color, Urine YELLOW (*)    APPearance CLEAR (*)    Hgb urine dipstick 2+ (*)    Leukocytes, UA 1+ (*)    Squamous Epithelial / LPF 0-5 (*)    All other components within normal limits   ____________________________________________  EKG  None ____________________________________________  RADIOLOGY  None ____________________________________________   PROCEDURES  Procedure(s) performed: None  Critical Care  performed: No  ____________________________________________   INITIAL IMPRESSION / ASSESSMENT AND PLAN / ED COURSE  Pertinent labs & imaging results that were available during my care of the patient were reviewed by me and considered in my medical decision making (see chart for details).  60 year old female who presents with dysuria and hematuria without abdominal or flank pain on exam. Urinalysis demonstrates 1+ leukocytes with 6-30 rbc's. Will initiate treatment with Cipro and Pyridium; patient follow up with her PCP next week. Strict return precautions given. Patient verbalizes understanding and agrees with plan of care. ____________________________________________   FINAL CLINICAL IMPRESSION(S) / ED DIAGNOSES  Final diagnoses:  UTI (lower urinary tract infection)  Dysuria  Paulette Blanch, MD 03/12/15 [phone]

## 2015-03-12 NOTE — ED Notes (Signed)
Pt presents to ED with blood in her urine since Sunday. Pt states she saw red bloodin her urine and reports that it burns badly when she voids. Pt states she also has pain to her left lower abd. Pt reports similar symptoms previously and was dx with uti at that time, but states symptoms were not as severe. Denies fever at home.

## 2015-03-12 NOTE — Discharge Instructions (Signed)
1. Take antibiotic as prescribed (Cipro 500 mg twice daily 7 days). 2. You may take urinary analgesic as prescribed (Pyridium #6). 3. Drink plenty of fluids daily. 4. Return to the ER for worsening symptoms, persistent vomiting, fevers or other concerns.  Dysuria Dysuria is pain or discomfort while urinating. The pain or discomfort may be felt in the tube that carries urine out of the bladder (urethra) or in the surrounding tissue of the genitals. The pain may also be felt in the groin area, lower abdomen, and lower back. You may have to urinate frequently or have the sudden feeling that you have to urinate (urgency). Dysuria can affect both men and women, but is more common in women. Dysuria can be caused by many different things, including:  Urinary tract infection in women.  Infection of the kidney or bladder.  Kidney stones or bladder stones.  Certain sexually transmitted infections (STIs), such as chlamydia.  Dehydration.  Inflammation of the vagina.  Use of certain medicines.  Use of certain soaps or scented products that cause irritation. HOME CARE INSTRUCTIONS Watch your dysuria for any changes. The following actions may help to reduce any discomfort you are feeling:  Drink enough fluid to keep your urine clear or pale yellow.  Empty your bladder often. Avoid holding urine for long periods of time.  After a bowel movement or urination, women should cleanse from front to back, using each tissue only once.  Empty your bladder after sexual intercourse.  Take medicines only as directed by your health care provider.  If you were prescribed an antibiotic medicine, finish it all even if you start to feel better.  Avoid caffeine, tea, and alcohol. They can irritate the bladder and make dysuria worse. In men, alcohol may irritate the prostate.  Keep all follow-up visits as directed by your health care provider. This is important.  If you had any tests done to find the cause  of dysuria, it is your responsibility to obtain your test results. Ask the lab or department performing the test when and how you will get your results. Talk with your health care provider if you have any questions about your results. SEEK MEDICAL CARE IF:  You develop pain in your back or sides.  You have a fever.  You have nausea or vomiting.  You have blood in your urine.  You are not urinating as often as you usually do. SEEK IMMEDIATE MEDICAL CARE IF:  You pain is severe and not relieved with medicines.  You are unable to hold down any fluids.  You or someone else notices a change in your mental function.  You have a rapid heartbeat at rest.  You have shaking or chills.  You feel extremely weak.   This information is not intended to replace advice given to you by your health care provider. Make sure you discuss any questions you have with your health care provider.   Document Released: 09/20/2003 Document Revised: 01/12/2014 Document Reviewed: 08/17/2013 Elsevier Interactive Patient Education 2016 Elsevier Inc.  Urinary Tract Infection Urinary tract infections (UTIs) can develop anywhere along your urinary tract. Your urinary tract is your body's drainage system for removing wastes and extra water. Your urinary tract includes two kidneys, two ureters, a bladder, and a urethra. Your kidneys are a pair of bean-shaped organs. Each kidney is about the size of your fist. They are located below your ribs, one on each side of your spine. CAUSES Infections are caused by microbes, which are microscopic  organisms, including fungi, viruses, and bacteria. These organisms are so small that they can only be seen through a microscope. Bacteria are the microbes that most commonly cause UTIs. SYMPTOMS  Symptoms of UTIs may vary by age and gender of the patient and by the location of the infection. Symptoms in young women typically include a frequent and intense urge to urinate and a painful,  burning feeling in the bladder or urethra during urination. Older women and men are more likely to be tired, shaky, and weak and have muscle aches and abdominal pain. A fever may mean the infection is in your kidneys. Other symptoms of a kidney infection include pain in your back or sides below the ribs, nausea, and vomiting. DIAGNOSIS To diagnose a UTI, your caregiver will ask you about your symptoms. Your caregiver will also ask you to provide a urine sample. The urine sample will be tested for bacteria and white blood cells. White blood cells are made by your body to help fight infection. TREATMENT  Typically, UTIs can be treated with medication. Because most UTIs are caused by a bacterial infection, they usually can be treated with the use of antibiotics. The choice of antibiotic and length of treatment depend on your symptoms and the type of bacteria causing your infection. HOME CARE INSTRUCTIONS  If you were prescribed antibiotics, take them exactly as your caregiver instructs you. Finish the medication even if you feel better after you have only taken some of the medication.  Drink enough water and fluids to keep your urine clear or pale yellow.  Avoid caffeine, tea, and carbonated beverages. They tend to irritate your bladder.  Empty your bladder often. Avoid holding urine for long periods of time.  Empty your bladder before and after sexual intercourse.  After a bowel movement, women should cleanse from front to back. Use each tissue only once. SEEK MEDICAL CARE IF:   You have back pain.  You develop a fever.  Your symptoms do not begin to resolve within 3 days. SEEK IMMEDIATE MEDICAL CARE IF:   You have severe back pain or lower abdominal pain.  You develop chills.  You have nausea or vomiting.  You have continued burning or discomfort with urination. MAKE SURE YOU:   Understand these instructions.  Will watch your condition.  Will get help right away if you are not  doing well or get worse.   This information is not intended to replace advice given to you by your health care provider. Make sure you discuss any questions you have with your health care provider.   Document Released: 10/01/2004 Document Revised: 09/12/2014 Document Reviewed: 01/30/2011 Elsevier Interactive Patient Education [employer].

## 2015-04-10 ENCOUNTER — Ambulatory Visit
Admission: RE | Admit: 2015-04-10 | Discharge: 2015-04-10 | Disposition: A | Discharge status: Home / Self Care | Payer: 59 | Source: Ambulatory Visit | Attending: General Surgery | Admitting: General Surgery

## 2015-04-10 DIAGNOSIS — Z853 Personal history of malignant neoplasm of breast: Secondary | ICD-10-CM | POA: Diagnosis not present

## 2015-04-10 DIAGNOSIS — Z1231 Encounter for screening mammogram for malignant neoplasm of breast: Secondary | ICD-10-CM | POA: Diagnosis not present

## 2015-04-10 HISTORY — DX: Malignant neoplasm of unspecified site of unspecified female breast: C50.919

## 2015-04-10 HISTORY — DX: Reserved for concepts with insufficient information to code with codable children: IMO0002

## 2015-04-23 ENCOUNTER — Ambulatory Visit (INDEPENDENT_AMBULATORY_CARE_PROVIDER_SITE_OTHER): Payer: 59 | Admitting: General Surgery

## 2015-04-23 ENCOUNTER — Encounter: Payer: Self-pay | Admitting: General Surgery

## 2015-04-23 VITALS — BP 144/84 | HR 80 | Resp 12 | Ht 62.0 in | Wt 157.0 lb

## 2015-04-23 DIAGNOSIS — Z803 Family history of malignant neoplasm of breast: Secondary | ICD-10-CM | POA: Diagnosis not present

## 2015-04-23 DIAGNOSIS — Z853 Personal history of malignant neoplasm of breast: Secondary | ICD-10-CM | POA: Diagnosis not present

## 2015-04-23 NOTE — Progress Notes (Signed)
Patient ID: Adriana Berg, female   DOB: May 09, 1955, 60 y.o.   MRN: [id number]  Chief Complaint  Patient presents with  . Follow-up    mammogram    HPI Adriana Berg is a 60 y.o. female who presents for a breast cancer follow up. The most recent mammogram was done on 04/13/15.  Patient does perform regular self breast checks and gets regular mammograms done.   No new breast issues. I have reviewed the history of present illness with the patient.  HPI  Past Medical History  Diagnosis Date  . Unspecified essential hypertension   . Personal history of tobacco use, presenting hazards to health   . Personal history of malignant neoplasm of breast     invasive ductal carcinoma with tubular features,Stage 1,ER pos,HER2 neg  . Cancer ([place])   . Borderline diabetes   . Breast cancer ([place]) 2007    RT LUMPECTOMY  . Radiation 2007    BREAST CA  . Pneumonia Feb 2017    Past Surgical History  Procedure Laterality Date  . Axillary sentinel node biopsy Right 2007  . Abdominal hysterectomy  1995  . Colonoscopy  12/27/2012  . Breast surgery  2007    right lumpectomy  . Breast biopsy Right 2007    CORE - POS  . Breast biopsy Right 1995    EXCISIONAL - NEG    Family History  Problem Relation Age of Onset  . Cancer Sister   . Breast cancer Sister 79  . Diabetes Mellitus II Mother   . CAD Father   . Stroke Brother     Social History Social History  Substance Use Topics  . Smoking status: Current Some Day Smoker -- 0.50 packs/day for 20 years    Types: Cigarettes  . Smokeless tobacco: Never Used  . Alcohol Use: No    Allergies  Allergen Reactions  . Azithromycin Swelling    Current Outpatient Prescriptions  Medication Sig Dispense Refill  . quinapril (ACCUPRIL) 40 MG tablet Take 1 tablet by mouth every morning.     . tretinoin (RETIN-A) 0.025 % cream Apply 1 application topically at bedtime. Apply pea-sized amount to entire face.  5   No current facility-administered  medications for this visit.    Review of Systems Review of Systems  Constitutional: Negative.   Respiratory: Negative.   Cardiovascular: Negative.     Blood pressure 144/84, pulse 80, resp. rate 12, height '5\' 2"'  (1.575 m), weight 157 lb (71.215 kg).  Physical Exam Physical Exam  Constitutional: She is oriented to person, place, and time. She appears well-developed and well-nourished.  Eyes: Conjunctivae are normal. No scleral icterus.  Neck: Neck supple.  Cardiovascular: Normal rate, regular rhythm and normal heart sounds.   Pulmonary/Chest: Effort normal and breath sounds normal. Right breast exhibits no inverted nipple, no mass, no nipple discharge, no skin change and no tenderness. Left breast exhibits no inverted nipple, no mass, no nipple discharge, no skin change and no tenderness.    Abdominal: Soft. Normal appearance and bowel sounds are normal. There is no hepatomegaly. There is no tenderness. No hernia.  Lymphadenopathy:    She has no cervical adenopathy.    She has no axillary adenopathy.  Neurological: She is alert and oriented to person, place, and time.  Skin: Skin is warm and dry.    Data Reviewed Mammogram reviewed  Assessment    Stable exam. History of right breast cancer.  Family history of breast cancer.  Plan    Patient will be asked to return to the office in one year with a bilateral screening mammogram.     PCP:  Denton Lank This information has been scribed by Gaspar Cola CMA.    Mable Dara G 04/23/2015, 10:20 AM

## 2015-04-23 NOTE — Patient Instructions (Addendum)
The patient is aware to call back for any questions or concerns. Patient will be asked to return to the office in one year with a bilateral screening mammogram. 

## 2015-06-06 DIAGNOSIS — Z22322 Carrier or suspected carrier of Methicillin resistant Staphylococcus aureus: Secondary | ICD-10-CM

## 2015-06-06 HISTORY — DX: Carrier or suspected carrier of methicillin resistant Staphylococcus aureus: Z22.322

## 2015-06-17 ENCOUNTER — Encounter: Payer: Self-pay | Admitting: Emergency Medicine

## 2015-06-17 ENCOUNTER — Emergency Department
Admission: EM | Admit: 2015-06-17 | Discharge: 2015-06-17 | Disposition: A | Discharge status: Home / Self Care | Payer: 59 | Attending: Emergency Medicine | Admitting: Emergency Medicine

## 2015-06-17 DIAGNOSIS — Z86010 Personal history of colonic polyps: Secondary | ICD-10-CM | POA: Insufficient documentation

## 2015-06-17 DIAGNOSIS — Z79899 Other long term (current) drug therapy: Secondary | ICD-10-CM | POA: Insufficient documentation

## 2015-06-17 DIAGNOSIS — Z853 Personal history of malignant neoplasm of breast: Secondary | ICD-10-CM | POA: Insufficient documentation

## 2015-06-17 DIAGNOSIS — L02211 Cutaneous abscess of abdominal wall: Secondary | ICD-10-CM | POA: Insufficient documentation

## 2015-06-17 DIAGNOSIS — I10 Essential (primary) hypertension: Secondary | ICD-10-CM | POA: Diagnosis not present

## 2015-06-17 DIAGNOSIS — L0390 Cellulitis, unspecified: Secondary | ICD-10-CM

## 2015-06-17 DIAGNOSIS — L0291 Cutaneous abscess, unspecified: Secondary | ICD-10-CM

## 2015-06-17 DIAGNOSIS — F17210 Nicotine dependence, cigarettes, uncomplicated: Secondary | ICD-10-CM | POA: Diagnosis not present

## 2015-06-17 DIAGNOSIS — L539 Erythematous condition, unspecified: Secondary | ICD-10-CM | POA: Diagnosis present

## 2015-06-17 MED ORDER — SULFAMETHOXAZOLE-TRIMETHOPRIM 800-160 MG PO TABS: 1.0000 | ORAL_TABLET | Freq: Two times a day (BID) | ORAL | Status: DC

## 2015-06-17 MED ORDER — OXYCODONE-ACETAMINOPHEN 7.5-325 MG PO TABS: 1.0000 | ORAL_TABLET | ORAL | Status: DC | PRN

## 2015-06-17 MED ORDER — LIDOCAINE HCL (PF) 1 % IJ SOLN
INTRAMUSCULAR | Status: AC
  Filled 2015-06-17: qty 5

## 2015-06-17 NOTE — ED Provider Notes (Signed)
[facility] Emergency Department Provider Note   ____________________________________________  Time seen: Approximately 10:09 AM  I have reviewed the triage vital signs and the nursing notes.   HISTORY  Chief Complaint Abscess    HPI Adriana Berg is a 60 y.o. female patient was ER of the abscess of the suprapubic area for 3 days. States  use warm compresses which only seemed to increased swelling. Patient denies any drainage from the area.Patient rates pain as a 8/10. No other palliative measures for this complaint. Patient states the first occurrence of this complaint.   Past Medical History  Diagnosis Date  . Unspecified essential hypertension   . Personal history of tobacco use, presenting hazards to health   . Personal history of malignant neoplasm of breast     invasive ductal carcinoma with tubular features,Stage 1,ER pos,HER2 neg  . Cancer ([place])   . Borderline diabetes   . Breast cancer ([place]) 2007    RT LUMPECTOMY  . Radiation 2007    BREAST CA  . Pneumonia Feb 2017    Patient Active Problem List   Diagnosis Date Noted  . Tobacco abuse 01/30/2015  . CAP (community acquired pneumonia) 01/27/2015  . SIRS (systemic inflammatory response syndrome) ([place]) 01/27/2015  . HTN (hypertension) 01/27/2015  . History of breast cancer 10/27/2012  . Personal history of colonic polyps 10/27/2012    Past Surgical History  Procedure Laterality Date  . Axillary sentinel node biopsy Right 2007  . Abdominal hysterectomy  1995  . Colonoscopy  12/27/2012  . Breast surgery  2007    right lumpectomy  . Breast biopsy Right 2007    CORE - POS  . Breast biopsy Right 1995    EXCISIONAL - NEG    Current Outpatient Rx  Name  Route  Sig  Dispense  Refill  . oxyCODONE-acetaminophen (PERCOCET) 7.5-325 MG tablet   Oral   Take 1 tablet by mouth every 4 (four) hours as needed for severe pain.   20 tablet   0   . quinapril (ACCUPRIL) 40 MG tablet    Oral   Take 1 tablet by mouth every morning.          . sulfamethoxazole-trimethoprim (BACTRIM DS,SEPTRA DS) 800-160 MG tablet   Oral   Take 1 tablet by mouth 2 (two) times daily.   20 tablet   0   . tretinoin (RETIN-A) 0.025 % cream   Topical   Apply 1 application topically at bedtime. Apply pea-sized amount to entire face.      5     Allergies Azithromycin  Family History  Problem Relation Age of Onset  . Cancer Sister   . Breast cancer Sister 17  . Diabetes Mellitus II Mother   . CAD Father   . Stroke Brother     Social History Social History  Substance Use Topics  . Smoking status: Current Some Day Smoker -- 0.50 packs/day for 20 years    Types: Cigarettes  . Smokeless tobacco: Never Used  . Alcohol Use: No    Review of Systems Constitutional: No fever/chills Eyes: No visual changes. ENT: No sore throat. Cardiovascular: Denies chest pain. Respiratory: Denies shortness of breath. Gastrointestinal: No abdominal pain.  No nausea, no vomiting.  No diarrhea.  No constipation. Genitourinary: Negative for dysuria. Musculoskeletal: Negative for back pain. Skin: Negative for rash. Neurological: Negative for headaches, focal weakness or numbness. Endocrine:Hypertension Allergic/Immunilogical: Azithromycin ___________________________________________   PHYSICAL EXAM:  VITAL SIGNS: ED Triage Vitals  Enc Vitals Group     BP 06/17/15 0930 156/79 mmHg     Pulse Rate 06/17/15 0930 93     Resp 06/17/15 0930 20     Temp 06/17/15 0930 98.3 F (36.8 C)     Temp Source 06/17/15 0930 Oral     SpO2 06/17/15 0930 97 %     Weight 06/17/15 0938 157 lb (71.215 kg)     Height --      Head Cir --      Peak Flow --      Pain Score 06/17/15 0931 8     Pain Loc --      Pain Edu? --      Excl. in [place]? --     Constitutional: Alert and oriented. Well appearing and in no acute distress. Eyes: Conjunctivae are normal. PERRL. EOMI. Head: Atraumatic. Nose: No  congestion/rhinnorhea. Mouth/Throat: Mucous membranes are moist.  Oropharynx non-erythematous. Neck: No stridor.  No cervical spine tenderness to palpation. Hematological/Lymphatic/Immunilogical: No cervical lymphadenopathy. Cardiovascular: Normal rate, regular rhythm. Grossly normal heart sounds.  Good peripheral circulation. Respiratory: Normal respiratory effort.  No retractions. Lungs CTAB. Gastrointestinal: Soft and nontender. No distention. No abdominal bruits. No CVA tenderness. Musculoskeletal: No lower extremity tenderness nor edema.  No joint effusions. Neurologic:  Normal speech and language. No gross focal neurologic deficits are appreciated. No gait instability. Skin:  Skin is warm, dry and intact. No rash noted.Nodular lesion with induration on erythematous base. Psychiatric: Mood and affect are normal. Speech and behavior are normal.  ____________________________________________   LABS (all labs ordered are listed, but only abnormal results are displayed)  Labs Reviewed - No data to display ____________________________________________  EKG   ____________________________________________  RADIOLOGY   ____________________________________________   PROCEDURES  Procedure(s) performed: INCISION AND DRAINAGE Performed by: Sable Feil Consent: Verbal consent obtained. Risks and benefits: risks, benefits and alternatives were discussed Type: abscess  Body area: Suprapubic area  Anesthesia: local infiltration  Incision was made with a scalpel.  Local anesthetic: lidocaine 1% with epinephrine  Anesthetic total: 3 ml  Complexity: complex Blunt dissection to break up loculations  Drainage: purulent  Drainage amount: Large Packing material: 1/4 in iodoform gauze  Patient tolerance: Patient tolerated the procedure well with no immediate complications.     Critical Care performed: No  ____________________________________________   INITIAL  IMPRESSION / ASSESSMENT AND PLAN / ED COURSE  Pertinent labs & imaging results that were available during my care of the patient were reviewed by me and considered in my medical decision making (see chart for details).  Suprapubic abscess. Patient given discharge care instructions. Patient given a prescription for Percocets and Bactrim DS. Patient advised return back to days for reevaluation. ____________________________________________   FINAL CLINICAL IMPRESSION(S) / ED DIAGNOSES  Final diagnoses:  Abscess and cellulitis      NEW MEDICATIONS STARTED DURING THIS VISIT:  New Prescriptions   OXYCODONE-ACETAMINOPHEN (PERCOCET) 7.5-325 MG TABLET    Take 1 tablet by mouth every 4 (four) hours as needed for severe pain.   SULFAMETHOXAZOLE-TRIMETHOPRIM (BACTRIM DS,SEPTRA DS) 800-160 MG TABLET    Take 1 tablet by mouth 2 (two) times daily.     Note:  This document was prepared using Dragon voice recognition software and may include unintentional dictation errors.    Sable Feil, PA-C 06/17/15 [place], MD 06/17/15 [id number]

## 2015-06-17 NOTE — Discharge Instructions (Signed)
Abscess °An abscess (boil or furuncle) is an infected area on or under the skin. This area is filled with yellowish-white fluid (pus) and other material (debris). °HOME CARE  °· Only take medicines as told by your doctor. °· If you were given antibiotic medicine, take it as directed. Finish the medicine even if you start to feel better. °· If gauze is used, follow your doctor's directions for changing the gauze. °· To avoid spreading the infection: °¨ Keep your abscess covered with a bandage. °¨ Wash your hands well. °¨ Do not share personal care items, towels, or whirlpools with others. °¨ Avoid skin contact with others. °· Keep your skin and clothes clean around the abscess. °· Keep all doctor visits as told. °GET HELP RIGHT AWAY IF:  °· You have more pain, puffiness (swelling), or redness in the wound site. °· You have more fluid or blood coming from the wound site. °· You have muscle aches, chills, or you feel sick. °· You have a fever. °MAKE SURE YOU:  °· Understand these instructions. °· Will watch your condition. °· Will get help right away if you are not doing well or get worse. °  °This information is not intended to replace advice given to you by your health care provider. Make sure you discuss any questions you have with your health care provider. °  °Document Released: 06/10/2007 Document Revised: 06/23/2011 Document Reviewed: 03/07/2011 °Elsevier Interactive Patient Education ©2016 Elsevier Inc. ° °

## 2015-06-17 NOTE — ED Notes (Signed)
Pt presents with possible abscess to vagina area, first noticed on last Friday.

## 2015-06-17 NOTE — ED Notes (Signed)
See triage note developed an abscess area to vaginal area 3 days ago   States she has used warm compresses to area w/o relief

## 2015-06-19 ENCOUNTER — Encounter: Payer: Self-pay | Admitting: *Deleted

## 2015-06-19 ENCOUNTER — Emergency Department
Admission: EM | Admit: 2015-06-19 | Discharge: 2015-06-19 | Disposition: A | Discharge status: Home / Self Care | Payer: 59 | Attending: Emergency Medicine | Admitting: Emergency Medicine

## 2015-06-19 DIAGNOSIS — I10 Essential (primary) hypertension: Secondary | ICD-10-CM | POA: Diagnosis not present

## 2015-06-19 DIAGNOSIS — Z853 Personal history of malignant neoplasm of breast: Secondary | ICD-10-CM | POA: Insufficient documentation

## 2015-06-19 DIAGNOSIS — F17210 Nicotine dependence, cigarettes, uncomplicated: Secondary | ICD-10-CM | POA: Insufficient documentation

## 2015-06-19 DIAGNOSIS — Z4800 Encounter for change or removal of nonsurgical wound dressing: Secondary | ICD-10-CM | POA: Insufficient documentation

## 2015-06-19 DIAGNOSIS — Z79899 Other long term (current) drug therapy: Secondary | ICD-10-CM | POA: Diagnosis not present

## 2015-06-19 DIAGNOSIS — Z09 Encounter for follow-up examination after completed treatment for conditions other than malignant neoplasm: Secondary | ICD-10-CM

## 2015-06-19 MED ORDER — CEPHALEXIN 500 MG PO CAPS: 500.0000 mg | ORAL_CAPSULE | Freq: Three times a day (TID) | ORAL | Status: DC

## 2015-06-19 NOTE — ED Notes (Signed)
Pt is here for wound recheck , abscess on peri area, pt seen in ED Monday

## 2015-06-19 NOTE — ED Provider Notes (Signed)
[facility] Emergency Department Provider Note  ____________________________________________  Time seen: Approximately 11:47 AM  I have reviewed the triage vital signs and the nursing notes.   HISTORY  Chief Complaint Wound Check    HPI Adriana Berg is a 60 y.o. female , NAD, presents to the emergency department for wound recheck after having an abscess incised and drained on Monday. States she was seen in this emergency department had an abscess about the vulva incised and drained. Has been tolerating her outpatient medications well without any side effects. States the area is still tender to touch. Has not complaining of any changes over the last 48 hours. Denies any fever, chills, body aches, abdominal pain, nausea, vomiting, diarrhea.   Past Medical History  Diagnosis Date  . Unspecified essential hypertension   . Personal history of tobacco use, presenting hazards to health   . Personal history of malignant neoplasm of breast     invasive ductal carcinoma with tubular features,Stage 1,ER pos,HER2 neg  . Cancer ([place])   . Borderline diabetes   . Breast cancer ([place]) 2007    RT LUMPECTOMY  . Radiation 2007    BREAST CA  . Pneumonia Feb 2017    Patient Active Problem List   Diagnosis Date Noted  . Tobacco abuse 01/30/2015  . CAP (community acquired pneumonia) 01/27/2015  . SIRS (systemic inflammatory response syndrome) ([place]) 01/27/2015  . HTN (hypertension) 01/27/2015  . History of breast cancer 10/27/2012  . Personal history of colonic polyps 10/27/2012    Past Surgical History  Procedure Laterality Date  . Axillary sentinel node biopsy Right 2007  . Abdominal hysterectomy  1995  . Colonoscopy  12/27/2012  . Breast surgery  2007    right lumpectomy  . Breast biopsy Right 2007    CORE - POS  . Breast biopsy Right 1995    EXCISIONAL - NEG    Current Outpatient Rx  Name  Route  Sig  Dispense  Refill  . cephALEXin (KEFLEX) 500 MG  capsule   Oral   Take 1 capsule (500 mg total) by mouth 3 (three) times daily.   21 capsule   0   . oxyCODONE-acetaminophen (PERCOCET) 7.5-325 MG tablet   Oral   Take 1 tablet by mouth every 4 (four) hours as needed for severe pain.   20 tablet   0   . quinapril (ACCUPRIL) 40 MG tablet   Oral   Take 1 tablet by mouth every morning.          . sulfamethoxazole-trimethoprim (BACTRIM DS,SEPTRA DS) 800-160 MG tablet   Oral   Take 1 tablet by mouth 2 (two) times daily.   20 tablet   0   . tretinoin (RETIN-A) 0.025 % cream   Topical   Apply 1 application topically at bedtime. Apply pea-sized amount to entire face.      5     Allergies Azithromycin  Family History  Problem Relation Age of Onset  . Cancer Sister   . Breast cancer Sister 70  . Diabetes Mellitus II Mother   . CAD Father   . Stroke Brother     Social History Social History  Substance Use Topics  . Smoking status: Current Some Day Smoker -- 0.50 packs/day for 20 years    Types: Cigarettes  . Smokeless tobacco: Never Used  . Alcohol Use: No     Review of Systems  Constitutional: No fever/chills, Fatigue ENT: No Swelling about the lips, tongue, throat.  Cardiovascular: No chest pain. Respiratory: No shortness of breath. No wheezing.  Gastrointestinal: No abdominal pain.  No nausea, vomiting.  No diarrhea.   Musculoskeletal: Negative for General myalgias.  Skin: Positive incised abscess of vulva. Negative for rash. Neurological: Negative for headaches, focal weakness or numbness. No tingling, saddle paresthesias 10-point ROS otherwise negative.  ____________________________________________   PHYSICAL EXAM:  VITAL SIGNS: ED Triage Vitals  Enc Vitals Group     BP 06/19/15 1053 145/87 mmHg     Pulse Rate 06/19/15 1053 90     Resp 06/19/15 1053 18     Temp 06/19/15 1053 97.4 F (36.3 C)     Temp Source 06/19/15 1053 Oral     SpO2 06/19/15 1053 94 %     Weight 06/19/15 1053 160 lb (72.576  kg)     Height 06/19/15 1053 '5\' 3"'  (1.6 m)     Head Cir --      Peak Flow --      Pain Score --      Pain Loc --      Pain Edu? --      Excl. in [place]? --      Constitutional: Alert and oriented. Well appearing and in no acute distress. Eyes: Conjunctivae are normal.  Head: Atraumatic. Respiratory: Normal respiratory effort without tachypnea or retractions.  Neurologic:  Normal speech and language. No gross focal neurologic deficits are appreciated.  Skin:  Incised abscess noted about the left vulva with packing in place. Yellowish purulent discharge is noted about the wound. Induration palpated approximately 2 cm lateral to the wound. No active oozing or weeping with palpation to the area. Interior of the wound with good granulation and no active oozing or weeping. Area is tender to light palpation. Skin is warm, dry. No rash noted. Psychiatric: Mood and affect are normal. Speech and behavior are normal. Patient exhibits appropriate insight and judgement.   ____________________________________________   LABS  None ____________________________________________  EKG  None ____________________________________________  RADIOLOGY  None  ____________________________________________    PROCEDURES  Procedure(s) performed: None    Medications - No data to display   ____________________________________________   INITIAL IMPRESSION / ASSESSMENT AND PLAN / ED COURSE  Patient's diagnosis is consistent with encounter for recheck of abscess following incision and drainage. Packing was completely removed and cleaned bandage applied to the area. Considering area of induration is still present, I have added Keflex to the patient's currently prescribed Bactrim for broader coverage.  Patient is to follow up with her primary care provider on Friday for wound recheck. Patient is given ED precautions to return to the ED for any worsening or new symptoms.       ____________________________________________  FINAL CLINICAL IMPRESSION(S) / ED DIAGNOSES  Final diagnoses:  Encounter for recheck of abscess following incision and drainage      NEW MEDICATIONS STARTED DURING THIS VISIT:  New Prescriptions   CEPHALEXIN (KEFLEX) 500 MG CAPSULE    Take 1 capsule (500 mg total) by mouth 3 (three) times daily.         Braxton Feathers, PA-C 06/19/15 1200  Lisa Roca, MD 06/19/15 1424

## 2015-06-19 NOTE — Discharge Instructions (Signed)
Wound Care °Taking care of your wound properly can help to prevent pain and infection. It can also help your wound to heal more quickly.  °HOW TO CARE FOR YOUR WOUND  °· Take or apply over-the-counter and prescription medicines only as told by your health care provider. °· If you were prescribed antibiotic medicine, take or apply it as told by your health care provider. Do not stop using the antibiotic even if your condition improves. °· Clean the wound each day or as told by your health care provider. °¨ Wash the wound with mild soap and water. °¨ Rinse the wound with water to remove all soap. °¨ Pat the wound dry with a clean towel. Do not rub it. °· There are many different ways to close and cover a wound. For example, a wound can be covered with stitches (sutures), skin glue, or adhesive strips. Follow instructions from your health care provider about: °¨ How to take care of your wound. °¨ When and how you should change your bandage (dressing). °¨ When you should remove your dressing. °¨ Removing whatever was used to close your wound. °· Check your wound every day for signs of infection. Watch for: °¨ Redness, swelling, or pain. °¨ Fluid, blood, or pus. °· Keep the dressing dry until your health care provider says it can be removed. Do not take baths, swim, use a hot tub, or do anything that would put your wound underwater until your health care provider approves. °· Raise (elevate) the injured area above the level of your heart while you are sitting or lying down. °· Do not scratch or pick at the wound. °· Keep all follow-up visits as told by your health care provider. This is important. °SEEK MEDICAL CARE IF: °· You received a tetanus shot and you have swelling, severe pain, redness, or bleeding at the injection site. °· You have a fever. °· Your pain is not controlled with medicine. °· You have increased redness, swelling, or pain at the site of your wound. °· You have fluid, blood, or pus coming from your  wound. °· You notice a bad smell coming from your wound or your dressing. °SEEK IMMEDIATE MEDICAL CARE IF: °· You have a red streak going away from your wound. °  °This information is not intended to replace advice given to you by your health care provider. Make sure you discuss any questions you have with your health care provider. °  °Document Released: 10/01/2007 Document Revised: 05/08/2014 Document Reviewed: 12/18/2013 °Elsevier Interactive Patient Education ©2016 [employer] ° °

## 2015-06-19 NOTE — ED Notes (Signed)
Here for wound check had abscess lanced on monday

## 2015-11-13 ENCOUNTER — Ambulatory Visit: Payer: 59 | Admitting: General Surgery

## 2015-11-19 ENCOUNTER — Emergency Department
Admission: EM | Admit: 2015-11-19 | Discharge: 2015-11-19 | Disposition: A | Discharge status: Home / Self Care | Payer: 59 | Attending: Emergency Medicine | Admitting: Emergency Medicine

## 2015-11-19 DIAGNOSIS — J069 Acute upper respiratory infection, unspecified: Secondary | ICD-10-CM | POA: Insufficient documentation

## 2015-11-19 DIAGNOSIS — B9789 Other viral agents as the cause of diseases classified elsewhere: Secondary | ICD-10-CM

## 2015-11-19 DIAGNOSIS — F17210 Nicotine dependence, cigarettes, uncomplicated: Secondary | ICD-10-CM | POA: Insufficient documentation

## 2015-11-19 DIAGNOSIS — R05 Cough: Secondary | ICD-10-CM | POA: Diagnosis present

## 2015-11-19 DIAGNOSIS — I10 Essential (primary) hypertension: Secondary | ICD-10-CM | POA: Insufficient documentation

## 2015-11-19 DIAGNOSIS — Z853 Personal history of malignant neoplasm of breast: Secondary | ICD-10-CM | POA: Insufficient documentation

## 2015-11-19 DIAGNOSIS — Z79899 Other long term (current) drug therapy: Secondary | ICD-10-CM | POA: Diagnosis not present

## 2015-11-19 MED ORDER — BENZONATATE 100 MG PO CAPS: 100.0000 mg | ORAL_CAPSULE | Freq: Three times a day (TID) | ORAL | 0 refills | Status: DC | PRN

## 2015-11-19 NOTE — ED Triage Notes (Signed)
Pt c/o cough with congestion since yesterday with sinus congestion

## 2015-11-19 NOTE — ED Provider Notes (Signed)
[facility] Emergency Department Provider Note  ____________________________________________  Time seen: Approximately 7:43 AM  I have reviewed the triage vital signs and the nursing notes.   HISTORY  Chief Complaint Cough   HPI Adriana MAIRENA is a 60 y.o. female now presents with 1 day of cough with clear phlegm production and congestion. Patient states that she has had some muscle aches, a headache, sore throat, and sinus tenderness. Patient has been taking DayQuil and Mucinex for symptoms, which have helped. Patient has several coworkers that are sick. The patient has smoked a half a pack a day for 10 years. Patient has received the pneumonia vaccine and received a flu shot this year.   Past Medical History:  Diagnosis Date  . Borderline diabetes   . Breast cancer ([place]) 2007   RT LUMPECTOMY  . Cancer ([place])   . Personal history of malignant neoplasm of breast    invasive ductal carcinoma with tubular features,Stage 1,ER pos,HER2 neg  . Personal history of tobacco use, presenting hazards to health   . Pneumonia Feb 2017  . Radiation 2007   BREAST CA  . Unspecified essential hypertension     Patient Active Problem List   Diagnosis Date Noted  . Tobacco abuse 01/30/2015  . CAP (community acquired pneumonia) 01/27/2015  . SIRS (systemic inflammatory response syndrome) ([place]) 01/27/2015  . HTN (hypertension) 01/27/2015  . History of breast cancer 10/27/2012  . Personal history of colonic polyps 10/27/2012    Past Surgical History:  Procedure Laterality Date  . ABDOMINAL HYSTERECTOMY  1995  . AXILLARY SENTINEL NODE BIOPSY Right 2007  . BREAST BIOPSY Right 2007   CORE - POS  . BREAST BIOPSY Right 1995   EXCISIONAL - NEG  . BREAST SURGERY  2007   right lumpectomy  . COLONOSCOPY  12/27/2012    Prior to Admission medications   Medication Sig Start Date End Date Taking? Authorizing Provider  benzonatate (TESSALON PERLES) 100 MG capsule Take 1  capsule (100 mg total) by mouth 3 (three) times daily as needed for cough. 11/19/15 11/29/15  Laban Emperor, PA-C  oxyCODONE-acetaminophen (PERCOCET) 7.5-325 MG tablet Take 1 tablet by mouth every 4 (four) hours as needed for severe pain. 06/17/15 06/16/16  Sable Feil, PA-C  quinapril (ACCUPRIL) 40 MG tablet Take 1 tablet by mouth every morning.  09/02/12   Historical Provider, MD  tretinoin (RETIN-A) 0.025 % cream Apply 1 application topically at bedtime. Apply pea-sized amount to entire face. 12/19/14   Historical Provider, MD    Allergies Azithromycin  Family History  Problem Relation Age of Onset  . Cancer Sister   . Breast cancer Sister 60  . Diabetes Mellitus II Mother   . CAD Father   . Stroke Brother     Social History Social History  Substance Use Topics  . Smoking status: Current Some Day Smoker    Packs/day: 0.50    Years: 20.00    Types: Cigarettes  . Smokeless tobacco: Never Used  . Alcohol use No    Review of Systems Constitutional: Negative fever. ENT: Negative for ear pain. No sneezing. Cardiovascular: Denies chest pain. Respiratory: Negative shortness of breath.  Positive for cough. Gastrointestinal: No nausea, No  vomiting. No diarrhea.  Musculoskeletal: Positive for body aches Skin: No for rash. Neurological: Positive for headaches. No vision changes. ____________________________________________   PHYSICAL EXAM:  VITAL SIGNS: ED Triage Vitals  Enc Vitals Group     BP 11/19/15 0720 (!) 189/87  Pulse Rate 11/19/15 0719 92     Resp 11/19/15 0719 18     Temp 11/19/15 0719 98 F (36.7 C)     Temp Source 11/19/15 0719 Oral     SpO2 11/19/15 0719 97 %     Weight 11/19/15 0720 158 lb (71.7 kg)     Height 11/19/15 0720 '5\' 3"'  (1.6 m)     Head Circumference --      Peak Flow --      Pain Score 11/19/15 0720 8     Pain Loc --      Pain Edu? --      Excl. in [place]? --     Constitutional: Alert and oriented. Well appearing and in no acute  distress. Eyes: Conjunctivae are normal. EOMI. Ears: Tympanic membranes pearly grey Nose: Moderate congestion; No rhinnorhea. Sinus tenderness Mouth/Throat: Mucous membranes are moist.  Oropharynx non erythematous. Tonsils appear Neck: No stridor.  Lymphatic: No cervical lymphadenopathy. Cardiovascular: Normal rate, regular rhythm. Grossly normal heart sounds.  Good peripheral circulation. Respiratory: Normal respiratory effort.  No retractions. Lungs CTAB. Gastrointestinal: Soft and nontender.  Musculoskeletal: FROM x 4 extremities.  Neurologic:  Normal speech and language.  Skin:  Skin is warm, dry and intact. No rash noted. Psychiatric: Mood and affect are normal. Speech and behavior are normal.  ____________________________________________    Critical Care performed: No  ____________________________________________   INITIAL IMPRESSION / ASSESSMENT AND PLAN / ED COURSE  Clinical Course     Pertinent labs & imaging results that were available during my care of the patient were reviewed by me and considered in my medical decision making (see chart for details).   Assessment: Viral URI Although patient has had pneumonia in the past, I do not think a chest xray is needed at this time. Patient has clear lung sounds, no fever, not tachycardic, and has had symptoms for 1 day. Patient could have flu but management would not change. Patient could have sinus infection but given duration, it is likely viral. Patient was instructed to return if symptoms worsen or fever develops.  ____________________________________________   FINAL CLINICAL IMPRESSION(S) / ED DIAGNOSES  Final diagnoses:  Viral URI with cough    Note:  This document was prepared using Dragon voice recognition software and may include unintentional dictation errors.    Laban Emperor, PA-C 11/19/15 4010    Nena Polio, MD 11/19/15 [phone]

## 2015-11-25 ENCOUNTER — Ambulatory Visit (INDEPENDENT_AMBULATORY_CARE_PROVIDER_SITE_OTHER): Payer: 59 | Admitting: General Surgery

## 2015-11-25 ENCOUNTER — Encounter: Payer: Self-pay | Admitting: General Surgery

## 2015-11-25 VITALS — BP 150/82 | HR 82 | Resp 14 | Ht 62.0 in | Wt 161.0 lb

## 2015-11-25 DIAGNOSIS — L723 Sebaceous cyst: Secondary | ICD-10-CM | POA: Diagnosis not present

## 2015-11-25 DIAGNOSIS — Z853 Personal history of malignant neoplasm of breast: Secondary | ICD-10-CM | POA: Diagnosis not present

## 2015-11-25 DIAGNOSIS — N644 Mastodynia: Secondary | ICD-10-CM | POA: Diagnosis not present

## 2015-11-25 NOTE — Progress Notes (Signed)
Patient ID: Adriana Berg, female   DOB: October 10, 1955, 60 y.o.   MRN: [id number]  Chief Complaint  Patient presents with  . Cyst    HPI Adriana Berg is a 59 y.o. female here for evaluation of a cyst on the left side of her neck. She states it has been there for a couple of years. She states that it has gotten larger. She complains that her left breast is very sore to touch. It has been this way for about 2 months. Her last mammogram was in April.  I have reviewed the history of present illness with the patient.  HPI  Past Medical History:  Diagnosis Date  . Borderline diabetes   . Breast cancer ([place]) 2007   RT LUMPECTOMY  . Cancer ([place])   . Personal history of malignant neoplasm of breast    invasive ductal carcinoma with tubular features,Stage 1,ER pos,HER2 neg  . Personal history of tobacco use, presenting hazards to health   . Pneumonia Feb 2017  . Radiation 2007   BREAST CA  . Unspecified essential hypertension     Past Surgical History:  Procedure Laterality Date  . ABDOMINAL HYSTERECTOMY  1995  . AXILLARY SENTINEL NODE BIOPSY Right 2007  . BREAST BIOPSY Right 2007   CORE - POS  . BREAST BIOPSY Right 1995   EXCISIONAL - NEG  . BREAST SURGERY  2007   right lumpectomy  . COLONOSCOPY  12/27/2012    Family History  Problem Relation Age of Onset  . Cancer Sister   . Breast cancer Sister 56  . Diabetes Mellitus II Mother   . CAD Father   . Stroke Brother     Social History Social History  Substance Use Topics  . Smoking status: Current Some Day Smoker    Packs/day: 0.50    Years: 20.00    Types: Cigarettes  . Smokeless tobacco: Never Used  . Alcohol use No    Allergies  Allergen Reactions  . Azithromycin Swelling    Current Outpatient Prescriptions  Medication Sig Dispense Refill  . quinapril (ACCUPRIL) 40 MG tablet Take 1 tablet by mouth every morning.     . tretinoin (RETIN-A) 0.025 % cream Apply 1 application topically at bedtime. Apply pea-sized  amount to entire face.  5   No current facility-administered medications for this visit.     Review of Systems Review of Systems  Constitutional: Negative.   Respiratory: Negative.   Cardiovascular: Negative.     Blood pressure (!) 150/82, pulse 82, resp. rate 14, height _0  (1.575 m), weight 161 lb (73 kg).  Physical Exam Physical Exam  Constitutional: She is oriented to person, place, and time. She appears well-developed and well-nourished.  Eyes: Conjunctivae are normal. No scleral icterus.  Neck: Neck supple.  sebaceous cyst about 1.5 cm.   Pulmonary/Chest: Left breast exhibits tenderness (medial and upper part ). Left breast exhibits no inverted nipple, no mass, no nipple discharge and no skin change.  Lymphadenopathy:    She has no cervical adenopathy.  Neurological: She is alert and oriented to person, place, and time.  Skin: Skin is warm and dry.  Psychiatric: She has a normal mood and affect.    Data Reviewed Prior notes  Assessment    Sebaceous cyst left side of neck   Mastalgia left breast- no findings of concern  Plan    Schedule removal of skin cyst in office    This has been scribed by Adriana Otis Brace  LPN    Adriana Berg G 11/27/2015, 8:39 AM

## 2015-11-25 NOTE — Patient Instructions (Signed)
Try wearing a snug bra for a few days during the day and at night. You may try using Aleve or Ibuprofen twice daily for a week.

## 2015-11-27 ENCOUNTER — Encounter: Payer: Self-pay | Admitting: General Surgery

## 2015-12-12 ENCOUNTER — Encounter: Payer: Self-pay | Admitting: General Surgery

## 2015-12-12 ENCOUNTER — Ambulatory Visit (INDEPENDENT_AMBULATORY_CARE_PROVIDER_SITE_OTHER): Payer: 59 | Admitting: General Surgery

## 2015-12-12 DIAGNOSIS — L723 Sebaceous cyst: Secondary | ICD-10-CM | POA: Diagnosis not present

## 2015-12-12 NOTE — Patient Instructions (Signed)
The patient is aware to call back for any questions or concerns. May use ice pack for comfort May shower

## 2015-12-12 NOTE — Progress Notes (Signed)
Patient ID: Adriana Berg, female   DOB: December 29, 1955, 60 y.o.   MRN: [id number]  Chief Complaint  Patient presents with  . Procedure    HPI Adriana Berg is a 60 y.o. female.  Here today for planned excision left neck mass. No complaints.  I have reviewed the history of present illness with the patient.  HPI  Past Medical History:  Diagnosis Date  . Borderline diabetes   . Breast cancer ([place]) 2007   RT LUMPECTOMY  . Cancer ([place])   . Personal history of malignant neoplasm of breast    invasive ductal carcinoma with tubular features,Stage 1,ER pos,HER2 neg  . Personal history of tobacco use, presenting hazards to health   . Pneumonia Feb 2017  . Radiation 2007   BREAST CA  . Unspecified essential hypertension     Past Surgical History:  Procedure Laterality Date  . ABDOMINAL HYSTERECTOMY  1995  . AXILLARY SENTINEL NODE BIOPSY Right 2007  . BREAST BIOPSY Right 2007   CORE - POS  . BREAST BIOPSY Right 1995   EXCISIONAL - NEG  . BREAST SURGERY  2007   right lumpectomy  . COLONOSCOPY  12/27/2012    Family History  Problem Relation Age of Onset  . Cancer Sister   . Breast cancer Sister 11  . Diabetes Mellitus II Mother   . CAD Father   . Stroke Brother     Social History Social History  Substance Use Topics  . Smoking status: Current Some Day Smoker    Packs/day: 0.50    Years: 20.00    Types: Cigarettes  . Smokeless tobacco: Never Used  . Alcohol use No    Allergies  Allergen Reactions  . Azithromycin Swelling    Current Outpatient Prescriptions  Medication Sig Dispense Refill  . quinapril (ACCUPRIL) 40 MG tablet Take 1 tablet by mouth every morning.     . tretinoin (RETIN-A) 0.025 % cream Apply 1 application topically at bedtime. Apply pea-sized amount to entire face.  5   No current facility-administered medications for this visit.     Review of Systems Review of Systems  Constitutional: Negative.   Respiratory: Negative.   Cardiovascular:  Negative.     Height '5\' 3"'  (1.6 m), weight 162 lb (73.5 kg).  Physical Exam Physical Exam  Constitutional: She is oriented to person, place, and time. She appears well-developed and well-nourished.  Neurological: She is alert and oriented to person, place, and time.  Skin: Skin is warm and dry.  Psychiatric: Her behavior is normal.    Data Reviewed  Progress notes.  Assessment     Sebaceous cyst left side of neck.    Plan   procedure: excision sebaceous cyst left side of neck  Anesthetic: 65m of 1% xylocaine with 0.5% marcaine  Prep: chlora prep  Description: After the local anesthetic and prep area was draped out with sterile towels. A vertical elliptical excision was mapped out incision was made accordingly of the 2 cm cyst. The cyst was freed from the underlying tissue and removed and sent to pathology in formalin. Small bleeding points were cauterized with thermal cautery. Skin was then approximated with interrupted stitches of 4-0 Prolene. The area was covered with the Neosporin ointment and Telfa and Tegaderm dressings placed. No immediate problems encountered from the procedure.    Follow up in 7 days for suture removal.  This information has been scribed by MKarie FetchRN, BSN,BC.   SANKAR,SEEPLAPUTHUR G 12/12/2015, 3:09 PM

## 2015-12-16 ENCOUNTER — Telehealth: Payer: Self-pay | Admitting: *Deleted

## 2015-12-16 NOTE — Telephone Encounter (Signed)
Notified patient as instructed, patient pleased. Discussed follow-up appointments, patient agrees  

## 2015-12-16 NOTE — Telephone Encounter (Signed)
-----   Message from Christene Lye, MD sent at 12/16/2015  2:41 PM EST ----- Please inform pt, path showed benign skin cyst

## 2015-12-18 ENCOUNTER — Ambulatory Visit (INDEPENDENT_AMBULATORY_CARE_PROVIDER_SITE_OTHER): Payer: 59 | Admitting: *Deleted

## 2015-12-18 DIAGNOSIS — L723 Sebaceous cyst: Secondary | ICD-10-CM

## 2015-12-18 NOTE — Progress Notes (Signed)
Patient came in today for a wound check.  The wound is clean, with no signs of infection noted. .Follow up as scheduled.  

## 2016-02-21 ENCOUNTER — Other Ambulatory Visit: Payer: Self-pay

## 2016-02-21 DIAGNOSIS — Z1231 Encounter for screening mammogram for malignant neoplasm of breast: Secondary | ICD-10-CM

## 2016-04-10 ENCOUNTER — Telehealth: Payer: Self-pay | Admitting: General Surgery

## 2016-04-10 ENCOUNTER — Ambulatory Visit
Admission: RE | Admit: 2016-04-10 | Discharge: 2016-04-10 | Disposition: A | Discharge status: Home / Self Care | Payer: 59 | Source: Ambulatory Visit | Attending: General Surgery | Admitting: General Surgery

## 2016-04-10 DIAGNOSIS — Z1231 Encounter for screening mammogram for malignant neoplasm of breast: Secondary | ICD-10-CM | POA: Diagnosis not present

## 2016-04-10 NOTE — Telephone Encounter (Signed)
L/M FOR PATIENT TO CALL & RESCHEDULE 04-16-16 TO NEXT AVAILABLE APPT

## 2016-04-16 ENCOUNTER — Ambulatory Visit: Payer: 59 | Admitting: General Surgery

## 2016-04-17 ENCOUNTER — Encounter: Payer: Self-pay | Admitting: General Surgery

## 2016-04-17 ENCOUNTER — Ambulatory Visit (INDEPENDENT_AMBULATORY_CARE_PROVIDER_SITE_OTHER): Payer: 59 | Admitting: General Surgery

## 2016-04-17 VITALS — BP 144/72 | Resp 12 | Ht 67.0 in | Wt 159.0 lb

## 2016-04-17 DIAGNOSIS — Z803 Family history of malignant neoplasm of breast: Secondary | ICD-10-CM

## 2016-04-17 DIAGNOSIS — Z853 Personal history of malignant neoplasm of breast: Secondary | ICD-10-CM

## 2016-04-17 NOTE — Patient Instructions (Addendum)
Labs ordered, CEA and Ca 27-29   The patient has been asked to return to the office in one year with a bilateral screening mammogram with Dr. Bary Castilla.

## 2016-04-17 NOTE — Progress Notes (Signed)
Patient ID: Adriana Berg, female   DOB: 02-May-1955, 61 y.o.   MRN: [id number]  Chief Complaint  Patient presents with  . Follow-up    HPI Adriana Berg is a 61 y.o. female who presents for a breast cancer follow up. The most recent mammogram was done on 04/10/2016. Patient does perform regular self breast checks and gets regular mammograms done. No breast symptoms, No new health issues.    HPI  Past Medical History:  Diagnosis Date  . Borderline diabetes   . Breast cancer ([place]) 2007   RT LUMPECTOMY  . Cancer ([place])   . Personal history of malignant neoplasm of breast    invasive ductal carcinoma with tubular features,Stage 1,ER pos,HER2 neg  . Personal history of tobacco use, presenting hazards to health   . Pneumonia Feb 2017  . Radiation 2007   BREAST CA  . Unspecified essential hypertension     Past Surgical History:  Procedure Laterality Date  . ABDOMINAL HYSTERECTOMY  1995  . AXILLARY SENTINEL NODE BIOPSY Right 2007  . BREAST BIOPSY Right 2007   CORE - POS  . BREAST BIOPSY Right 1995   EXCISIONAL - NEG  . BREAST SURGERY  2007   right lumpectomy  . COLONOSCOPY  12/27/2012    Family History  Problem Relation Age of Onset  . Cancer Sister   . Breast cancer Sister 20  . Diabetes Mellitus II Mother   . CAD Father   . Stroke Brother     Social History Social History  Substance Use Topics  . Smoking status: Current Some Day Smoker    Packs/day: 0.50    Years: 20.00    Types: Cigarettes  . Smokeless tobacco: Never Used  . Alcohol use No    Allergies  Allergen Reactions  . Azithromycin Swelling    Current Outpatient Prescriptions  Medication Sig Dispense Refill  . quinapril (ACCUPRIL) 40 MG tablet Take 1 tablet by mouth every morning.     . tretinoin (RETIN-A) 0.025 % cream Apply 1 application topically at bedtime. Apply pea-sized amount to entire face.  5   No current facility-administered medications for this visit.     Review of Systems Review of  Systems  Constitutional: Negative.   Respiratory: Negative.   Cardiovascular: Negative.     Blood pressure (!) 144/72, resp. rate 12, height _0  (1.702 m), weight 159 lb (72.1 kg).  Physical Exam Physical Exam  Constitutional: She is oriented to person, place, and time. She appears well-developed and well-nourished.  Eyes: Conjunctivae are normal. No scleral icterus.  Neck: Neck supple.  Cardiovascular: Normal rate, regular rhythm and normal heart sounds.   Pulmonary/Chest: Effort normal and breath sounds normal. Right breast exhibits no inverted nipple, no mass, no nipple discharge and no tenderness. Left breast exhibits no inverted nipple, no mass, no nipple discharge, no skin change and no tenderness.    Abdominal: Soft. Bowel sounds are normal. There is no tenderness.  Lymphadenopathy:    She has no cervical adenopathy.    She has no axillary adenopathy.  Neurological: She is alert and oriented to person, place, and time.  Skin: Skin is warm and dry.    Data Reviewed Mammogram and prior notes reviewed   Assessment   Stable physical exam. History of right breast cancer and family history of breast cancer.  Screening colonoscopy was normal in 2014 and not due until 2024.  Plan    Labs ordered, CEA and Ca 27-29.  The patient has been asked to return to the office in one year with a bilateral screening mammogram with Dr. Bary Castilla.  HPI, Physical Exam, Assessment and Plan have been scribed under the direction and in the presence of Mckinley Jewel, MD   Gaspar Cola, CMA  I have completed the exam and reviewed the above documentation for accuracy and completeness.  I agree with the above.  Haematologist has been used and any errors in dictation or transcription are unintentional.  Mariadejesus Cade G. Jamal Collin, M.D., F.A.C.S. Junie Panning G 04/17/2016, 11:57 AM

## 2016-04-18 LAB — CEA: CEA: 5.2 ng/mL — AB (ref 0.0–4.7)

## 2016-04-18 LAB — CANCER ANTIGEN 27.29: CA 27.29: 17.4 U/mL (ref 0.0–38.6)

## 2016-04-21 ENCOUNTER — Telehealth: Payer: Self-pay | Admitting: *Deleted

## 2016-04-21 NOTE — Telephone Encounter (Signed)
Notified patient as instructed, patient pleased. Discussed follow-up appointments, patient agrees  

## 2016-04-21 NOTE — Telephone Encounter (Signed)
-----   Message from Christene Lye, MD sent at 04/20/2016  9:27 AM EDT ----- Labs ok.

## 2017-01-19 ENCOUNTER — Encounter: Payer: Self-pay | Admitting: Medical Oncology

## 2017-01-19 ENCOUNTER — Emergency Department: Payer: Commercial Managed Care - HMO

## 2017-01-19 ENCOUNTER — Emergency Department
Admission: EM | Admit: 2017-01-19 | Discharge: 2017-01-19 | Disposition: A | Discharge status: Home / Self Care | Payer: Commercial Managed Care - HMO | Attending: Emergency Medicine | Admitting: Emergency Medicine

## 2017-01-19 DIAGNOSIS — F17210 Nicotine dependence, cigarettes, uncomplicated: Secondary | ICD-10-CM | POA: Diagnosis not present

## 2017-01-19 DIAGNOSIS — I10 Essential (primary) hypertension: Secondary | ICD-10-CM | POA: Insufficient documentation

## 2017-01-19 DIAGNOSIS — Z853 Personal history of malignant neoplasm of breast: Secondary | ICD-10-CM | POA: Diagnosis not present

## 2017-01-19 DIAGNOSIS — J069 Acute upper respiratory infection, unspecified: Secondary | ICD-10-CM | POA: Insufficient documentation

## 2017-01-19 DIAGNOSIS — Z79899 Other long term (current) drug therapy: Secondary | ICD-10-CM | POA: Insufficient documentation

## 2017-01-19 DIAGNOSIS — R05 Cough: Secondary | ICD-10-CM | POA: Diagnosis present

## 2017-01-19 LAB — INFLUENZA PANEL BY PCR (TYPE A & B)
INFLAPCR: NEGATIVE
INFLBPCR: NEGATIVE

## 2017-01-19 MED ORDER — ALBUTEROL SULFATE HFA 108 (90 BASE) MCG/ACT IN AERS: 2.0000 | INHALATION_SPRAY | Freq: Four times a day (QID) | RESPIRATORY_TRACT | 0 refills | Status: DC | PRN

## 2017-01-19 MED ORDER — ALBUTEROL SULFATE (2.5 MG/3ML) 0.083% IN NEBU: 2.5000 mg | INHALATION_SOLUTION | Freq: Four times a day (QID) | RESPIRATORY_TRACT | 12 refills | Status: DC | PRN

## 2017-01-19 MED ORDER — ALBUTEROL SULFATE (2.5 MG/3ML) 0.083% IN NEBU: 2.5000 mg | INHALATION_SOLUTION | Freq: Four times a day (QID) | RESPIRATORY_TRACT | 0 refills | Status: DC | PRN

## 2017-01-19 MED ORDER — DOXYCYCLINE HYCLATE 50 MG PO CAPS: 100.0000 mg | ORAL_CAPSULE | Freq: Two times a day (BID) | ORAL | 0 refills | Status: AC

## 2017-01-19 MED ORDER — ACETAMINOPHEN 325 MG PO TABS
650.0000 mg | ORAL_TABLET | Freq: Once | ORAL | Status: AC
  Administered 2017-01-19: 650 mg via ORAL
  Filled 2017-01-19: qty 2

## 2017-01-19 MED ORDER — IPRATROPIUM-ALBUTEROL 0.5-2.5 (3) MG/3ML IN SOLN
3.0000 mL | Freq: Once | RESPIRATORY_TRACT | Status: AC
  Administered 2017-01-19: 3 mL via RESPIRATORY_TRACT
  Filled 2017-01-19: qty 3

## 2017-01-19 NOTE — ED Triage Notes (Signed)
Pt reports she has had cough, congestion, headache, chills x 2 days. Pt reports pain to back and chest when coughing only.

## 2017-01-19 NOTE — ED Provider Notes (Signed)
[facility] Emergency Department Provider Note  ____________________________________________  Time seen: Approximately 11:29 AM  I have reviewed the triage vital signs and the nursing notes.   HISTORY  Chief Complaint Cough and Chills    HPI Adriana Berg is a 62 y.o. female that presents to the emergency department for evaluation of chills, headache, nasal congestion, sore throat, productive cough with clear sputum, shortness of breath, body aches for 2 days.  Patients son and wife are sick with with cold symptoms.  She is eating and drinking well.  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  She received her flu shot this year.  No vomiting, abdominal pain.  Past Medical History:  Diagnosis Date  . Borderline diabetes   . Breast cancer ([place]) 2007   RT LUMPECTOMY  . Cancer ([place])   . Personal history of malignant neoplasm of breast    invasive ductal carcinoma with tubular features,Stage 1,ER pos,HER2 neg  . Personal history of tobacco use, presenting hazards to health   . Pneumonia Feb 2017  . Radiation 2007   BREAST CA  . Unspecified essential hypertension     Patient Active Problem List   Diagnosis Date Noted  . Tobacco abuse 01/30/2015  . CAP (community acquired pneumonia) 01/27/2015  . SIRS (systemic inflammatory response syndrome) ([place]) 01/27/2015  . HTN (hypertension) 01/27/2015  . History of breast cancer 10/27/2012  . Personal history of colonic polyps 10/27/2012    Past Surgical History:  Procedure Laterality Date  . ABDOMINAL HYSTERECTOMY  1995  . AXILLARY SENTINEL NODE BIOPSY Right 2007  . BREAST BIOPSY Right 2007   CORE - POS  . BREAST BIOPSY Right 1995   EXCISIONAL - NEG  . BREAST SURGERY  2007   right lumpectomy  . COLONOSCOPY  12/27/2012    Prior to Admission medications   Medication Sig Start Date End Date Taking? Authorizing Provider  albuterol (PROVENTIL HFA;VENTOLIN HFA) 108 (90 Base) MCG/ACT  inhaler Inhale 2 puffs into the lungs every 6 (six) hours as needed for wheezing or shortness of breath. 01/19/17   Laban Emperor, PA-C  albuterol (PROVENTIL) (2.5 MG/3ML) 0.083% nebulizer solution Take 3 mLs (2.5 mg total) by nebulization every 6 (six) hours as needed for wheezing or shortness of breath. 01/19/17   Laban Emperor, PA-C  doxycycline (VIBRAMYCIN) 50 MG capsule Take 2 capsules (100 mg total) by mouth 2 (two) times daily for 10 days. 01/19/17 01/29/17  Laban Emperor, PA-C  quinapril (ACCUPRIL) 40 MG tablet Take 1 tablet by mouth every morning.  09/02/12   [provider]  tretinoin (RETIN-A) 0.025 % cream Apply 1 application topically at bedtime. Apply pea-sized amount to entire face. 12/19/14   [provider]    Allergies Azithromycin  Family History  Problem Relation Age of Onset  . Cancer Sister   . Breast cancer Sister 77  . Diabetes Mellitus II Mother   . CAD Father   . Stroke Brother     Social History Social History   Tobacco Use  . Smoking status: Current Some Day Smoker    Packs/day: 0.50    Years: 20.00    Pack years: 10.00    Types: Cigarettes  . Smokeless tobacco: Never Used  Substance Use Topics  . Alcohol use: No  . Drug use: No     Review of Systems  Eyes: No visual changes. No discharge. ENT: Positive for congestion and rhinorrhea. Respiratory: Positive for  cough.  Gastrointestinal: No abdominal pain.  No nausea, no vomiting.  No diarrhea.  No constipation. Musculoskeletal: Positive for body aches. Skin: Negative for rash, abrasions, lacerations, ecchymosis.   ____________________________________________   PHYSICAL EXAM:  VITAL SIGNS: ED Triage Vitals  Enc Vitals Group     BP 01/19/17 1050 (!) 181/95     Pulse Rate 01/19/17 1050 82     Resp 01/19/17 1050 19     Temp 01/19/17 1050 98.4 F (36.9 C)     Temp Source 01/19/17 1050 Oral     SpO2 01/19/17 1050 100 %     Weight 01/19/17 1048 162 lb (73.5 kg)     Height  01/19/17 1048 '5\' 3"'  (1.6 m)     Head Circumference --      Peak Flow --      Pain Score 01/19/17 1048 8     Pain Loc --      Pain Edu? --      Excl. in [place]? --      Constitutional: Alert and oriented. Well appearing and in no acute distress. Eyes: Conjunctivae are normal. PERRL. EOMI. No discharge. Head: Atraumatic. ENT: No frontal and maxillary sinus tenderness.      Ears: Tympanic membranes pearly gray with good landmarks. No discharge.      Nose: Mild congestion/rhinnorhea.      Mouth/Throat: Mucous membranes are moist. Oropharynx non-erythematous. Tonsils not enlarged. No exudates. Uvula midline. Neck: No stridor.   Hematological/Lymphatic/Immunilogical: No cervical lymphadenopathy. Cardiovascular: Normal rate, regular rhythm.  Good peripheral circulation. Respiratory: Normal respiratory effort without tachypnea or retractions. Lungs CTAB. Good air entry to the bases with no decreased or absent breath sounds. Gastrointestinal: Bowel sounds 4 quadrants. Soft and nontender to palpation. No guarding or rigidity. No palpable masses. No distention. Musculoskeletal: Full range of motion to all extremities. No gross deformities appreciated. Neurologic:  Normal speech and language. No gross focal neurologic deficits are appreciated.  Skin:  Skin is warm, dry and intact. No rash noted. Psychiatric: Mood and affect are normal. Speech and behavior are normal. Patient exhibits appropriate insight and judgement.   ____________________________________________   LABS (all labs ordered are listed, but only abnormal results are displayed)  Labs Reviewed  INFLUENZA PANEL BY PCR (TYPE A & B)   ____________________________________________  EKG   ____________________________________________  RADIOLOGY Robinette Haines, personally viewed and evaluated these images (plain radiographs) as part of my medical decision making, as well as reviewing the written report by the radiologist.  Dg  Chest 2 View  Result Date: 01/19/2017 CLINICAL DATA:  Three days of cough with gradual onset of generalized chest pain. Current smoker. History of breast malignancy, community-acquired pneumonia. EXAM: CHEST  2 VIEW COMPARISON:  PA and lateral chest x-ray of January 30, 2015 FINDINGS: The lungs are adequately inflated. The interstitial markings are mildly prominent. There is no alveolar infiltrate or pleural effusion. The cardiac silhouette is enlarged. The pulmonary vascularity is mildly prominent centrally. There tortuosity of the descending thoracic aorta. There is no pleural effusion. The bony thorax exhibits no acute abnormality. IMPRESSION: Chronic bronchitic-smoking related changes. No acute pneumonia. Mild cardiomegaly with central pulmonary vascular congestion may reflect low-grade compensated CHF in the appropriate clinical setting. Electronically Signed   By: David  Martinique M.D.   On: 01/19/2017 11:47    ____________________________________________    PROCEDURES  Procedure(s) performed:    Procedures    Medications  ipratropium-albuterol (DUONEB) 0.5-2.5 (3) MG/3ML nebulizer solution 3 mL (3 mLs Nebulization Given  01/19/17 1231)  acetaminophen (TYLENOL) tablet 650 mg (650 mg Oral Given 01/19/17 1327)     ____________________________________________   INITIAL IMPRESSION / ASSESSMENT AND PLAN / ED COURSE  Pertinent labs & imaging results that were available during my care of the patient were reviewed by me and considered in my medical decision making (see chart for details).  Review of the [place] CSRS was performed in accordance of the [place] prior to dispensing any controlled drugs.    Patient's diagnosis is consistent with bronchitis. Vital signs and exam are reassuring.  Chest x-ray negative for pneumonia.  Findings on chest x-ray are discussed with patient.  She has no history of CHF so this is unlikely since she has current cold symptoms.  No changes on EKG from previous EKG.   Patient received a DuoNeb in states that cough and shortness of breath symptoms resolved after treatment.  She would like a nebulizer machine.  Patient appears well and is staying well hydrated. Patient feels comfortable going home.  She is allergic to azithromycin.  Patient will be discharged home with prescriptions for doxycycline, albuterol nebulizer, albuterol inhaler. Patient is to follow up with PCP as needed or otherwise directed. Patient is given ED precautions to return to the ED for any worsening or new symptoms.     ____________________________________________  FINAL CLINICAL IMPRESSION(S) / ED DIAGNOSES  Final diagnoses:  URI with cough and congestion      NEW MEDICATIONS STARTED DURING THIS VISIT:  ED Discharge Orders        Ordered    doxycycline (VIBRAMYCIN) 50 MG capsule  2 times daily     01/19/17 1322    albuterol (PROVENTIL HFA;VENTOLIN HFA) 108 (90 Base) MCG/ACT inhaler  Every 6 hours PRN,   Status:  Discontinued     01/19/17 1322    albuterol (PROVENTIL) (2.5 MG/3ML) 0.083% nebulizer solution  Every 6 hours PRN,   Status:  Discontinued     01/19/17 1322    DME Nebulizer machine     01/19/17 1322    albuterol (PROVENTIL HFA;VENTOLIN HFA) 108 (90 Base) MCG/ACT inhaler  Every 6 hours PRN     01/19/17 1323    albuterol (PROVENTIL) (2.5 MG/3ML) 0.083% nebulizer solution  Every 6 hours PRN,   Status:  Discontinued     01/19/17 1323    albuterol (PROVENTIL) (2.5 MG/3ML) 0.083% nebulizer solution  Every 6 hours PRN     01/19/17 1323          This chart was dictated using voice recognition software/Dragon. Despite best efforts to proofread, errors can occur which can change the meaning. Any change was purely unintentional.    Laban Emperor, PA-C 01/19/17 [place], Randall An, MD 01/20/17 [phone]

## 2017-03-08 ENCOUNTER — Other Ambulatory Visit: Payer: Self-pay

## 2017-03-08 DIAGNOSIS — Z1231 Encounter for screening mammogram for malignant neoplasm of breast: Secondary | ICD-10-CM

## 2017-04-15 ENCOUNTER — Ambulatory Visit
Admission: RE | Admit: 2017-04-15 | Discharge: 2017-04-15 | Disposition: A | Discharge status: Home / Self Care | Payer: 59 | Source: Ambulatory Visit | Attending: General Surgery | Admitting: General Surgery

## 2017-04-15 DIAGNOSIS — Z1231 Encounter for screening mammogram for malignant neoplasm of breast: Secondary | ICD-10-CM | POA: Diagnosis present

## 2017-04-15 HISTORY — DX: Personal history of irradiation: Z92.3

## 2017-04-22 ENCOUNTER — Ambulatory Visit: Payer: 59 | Admitting: General Surgery

## 2017-05-20 ENCOUNTER — Ambulatory Visit: Payer: 59 | Admitting: General Surgery

## 2017-05-20 ENCOUNTER — Encounter: Payer: Self-pay | Admitting: General Surgery

## 2017-05-20 VITALS — BP 122/78 | HR 71 | Resp 12 | Ht 63.0 in | Wt 160.0 lb

## 2017-05-20 DIAGNOSIS — Z853 Personal history of malignant neoplasm of breast: Secondary | ICD-10-CM

## 2017-05-20 NOTE — Patient Instructions (Addendum)
The patient is aware to call back for any questions or concerns. Follow up in one year with bilateral screening mammogram and office visit 

## 2017-05-20 NOTE — Progress Notes (Signed)
Patient ID: Adriana Berg, female   DOB: 08-19-1955, 62 y.o.   MRN: [id number]  Chief Complaint  Patient presents with  . Follow-up    HPI Adriana Berg is a 62 y.o. female.  who presents for her right breast cancer follow up and a breast evaluation. Former patient of Dr Jamal Collin. The most recent mammogram was done on 04-15-17.  Patient does perform regular self breast checks and gets regular mammograms done.  No breast symptoms, no new health issues. she reports some breast soreness at the surgery site and still has some soreness of the left breast.  Screening colonoscopy was normal in 2014 and not due until 2024.    HPI  Past Medical History:  Diagnosis Date  . Borderline diabetes   . Breast cancer ([place]) 2007   RT LUMPECTOMY  . Cancer ([place])   . Personal history of malignant neoplasm of breast    invasive ductal carcinoma with tubular features,Stage 1,ER pos,HER2 neg  . Personal history of radiation therapy 2007  . Personal history of tobacco use, presenting hazards to health   . Pneumonia Feb 2017  . Radiation 2007   BREAST CA  . Unspecified essential hypertension     Past Surgical History:  Procedure Laterality Date  . ABDOMINAL HYSTERECTOMY  1995  . AXILLARY SENTINEL NODE BIOPSY Right 2007  . BREAST BIOPSY Right 2007   CORE - POS  . BREAST BIOPSY Right 1995   EXCISIONAL - NEG  . BREAST SURGERY  2007   right lumpectomy  . COLONOSCOPY  12/27/2012    Family History  Problem Relation Age of Onset  . Cancer Sister   . Breast cancer Sister 42  . Diabetes Mellitus II Mother   . CAD Father   . Stroke Brother     Social History Social History   Tobacco Use  . Smoking status: Current Some Day Smoker    Packs/day: 0.50    Years: 20.00    Pack years: 10.00    Types: Cigarettes  . Smokeless tobacco: Never Used  Substance Use Topics  . Alcohol use: No  . Drug use: No    Allergies  Allergen Reactions  . Azithromycin Swelling    Current Outpatient Medications   Medication Sig Dispense Refill  . albuterol (PROVENTIL HFA;VENTOLIN HFA) 108 (90 Base) MCG/ACT inhaler Inhale 2 puffs into the lungs every 6 (six) hours as needed for wheezing or shortness of breath. 1 Inhaler 0  . albuterol (PROVENTIL) (2.5 MG/3ML) 0.083% nebulizer solution Take 3 mLs (2.5 mg total) by nebulization every 6 (six) hours as needed for wheezing or shortness of breath. 75 mL 0  . quinapril (ACCUPRIL) 40 MG tablet Take 1 tablet by mouth every morning.      No current facility-administered medications for this visit.     Review of Systems Review of Systems  Constitutional: Negative.   Respiratory: Negative.   Cardiovascular: Negative.     Blood pressure 122/78, pulse 71, resp. rate 12, height '5\' 3"'  (1.6 m), weight 160 lb (72.6 kg), SpO2 96 %.  Physical Exam Physical Exam  Constitutional: She is oriented to person, place, and time. She appears well-developed and well-nourished.  Eyes: Conjunctivae are normal. No scleral icterus.  Neck: Neck supple.  Cardiovascular: Normal rate, regular rhythm and normal heart sounds.  Pulmonary/Chest: Effort normal and breath sounds normal. Right breast exhibits no inverted nipple, no mass, no nipple discharge, no skin change and no tenderness. Left breast exhibits no inverted nipple, no  mass, no nipple discharge, no skin change and no tenderness.  Lymphadenopathy:    She has no cervical adenopathy.    She has no axillary adenopathy.  Neurological: She is alert and oriented to person, place, and time.  Skin: Skin is warm and dry.  Psychiatric: She has a normal mood and affect.    Data Reviewed Screening mammograms dated April 15, 2017 were reviewed and compared to prior studies.  BI-RADS-1.  Assessment    Benign breast exam.    Plan    Follow up in one year with bilateral screening mammogram and office visit.    HPI, Physical Exam, Assessment and Plan have been scribed under the direction and in the presence of Robert Bellow, MD  Concepcion Living, LPN  I have completed the exam and reviewed the above documentation for accuracy and completeness.  I agree with the above.  Haematologist has been used and any errors in dictation or transcription are unintentional.  Hervey Ard, M.D., F.A.C.S.  Forest Gleason Byrnett 05/21/2017, 9:52 PM

## 2017-06-17 ENCOUNTER — Emergency Department
Admission: EM | Admit: 2017-06-17 | Discharge: 2017-06-17 | Disposition: A | Discharge status: Home / Self Care | Payer: Commercial Managed Care - HMO | Attending: Student in an Organized Health Care Education/Training Program | Admitting: Student in an Organized Health Care Education/Training Program

## 2017-06-17 ENCOUNTER — Emergency Department: Payer: Commercial Managed Care - HMO

## 2017-06-17 ENCOUNTER — Encounter: Payer: Self-pay | Admitting: Intensive Care

## 2017-06-17 DIAGNOSIS — Z79899 Other long term (current) drug therapy: Secondary | ICD-10-CM | POA: Diagnosis not present

## 2017-06-17 DIAGNOSIS — Z853 Personal history of malignant neoplasm of breast: Secondary | ICD-10-CM | POA: Diagnosis not present

## 2017-06-17 DIAGNOSIS — I10 Essential (primary) hypertension: Secondary | ICD-10-CM | POA: Insufficient documentation

## 2017-06-17 DIAGNOSIS — R202 Paresthesia of skin: Secondary | ICD-10-CM | POA: Insufficient documentation

## 2017-06-17 DIAGNOSIS — H538 Other visual disturbances: Secondary | ICD-10-CM | POA: Diagnosis present

## 2017-06-17 DIAGNOSIS — G459 Transient cerebral ischemic attack, unspecified: Secondary | ICD-10-CM

## 2017-06-17 DIAGNOSIS — F17210 Nicotine dependence, cigarettes, uncomplicated: Secondary | ICD-10-CM | POA: Diagnosis not present

## 2017-06-17 LAB — URINALYSIS, COMPLETE (UACMP) WITH MICROSCOPIC
BACTERIA UA: NONE SEEN
Bilirubin Urine: NEGATIVE
GLUCOSE, UA: NEGATIVE mg/dL
KETONES UR: NEGATIVE mg/dL
LEUKOCYTES UA: NEGATIVE
Nitrite: NEGATIVE
PH: 7 (ref 5.0–8.0)
Protein, ur: NEGATIVE mg/dL
SPECIFIC GRAVITY, URINE: 1.01 (ref 1.005–1.030)

## 2017-06-17 LAB — BASIC METABOLIC PANEL
Anion gap: 10 (ref 5–15)
BUN: 9 mg/dL (ref 6–20)
CHLORIDE: 103 mmol/L (ref 101–111)
CO2: 26 mmol/L (ref 22–32)
CREATININE: 0.82 mg/dL (ref 0.44–1.00)
Calcium: 9.1 mg/dL (ref 8.9–10.3)
GFR calc Af Amer: >60 mL/min (ref >60)
GLUCOSE: 101 mg/dL — AB (ref 65–99)
Potassium: 4 mmol/L (ref 3.5–5.1)
SODIUM: 139 mmol/L (ref 135–145)

## 2017-06-17 LAB — LIPID PANEL
Cholesterol: 203 mg/dL — ABNORMAL HIGH (ref 0–200)
HDL: 39 mg/dL — AB (ref >40)
LDL CALC: 138 mg/dL — AB (ref 0–99)
TRIGLYCERIDES: 132 mg/dL (ref <150)
Total CHOL/HDL Ratio: 5.2 RATIO
VLDL: 26 mg/dL (ref 0–40)

## 2017-06-17 LAB — CBC
HEMATOCRIT: 43.8 % (ref 35.0–47.0)
Hemoglobin: 14.9 g/dL (ref 12.0–16.0)
MCH: 31.1 pg (ref 26.0–34.0)
MCHC: 34 g/dL (ref 32.0–36.0)
MCV: 91.5 fL (ref 80.0–100.0)
PLATELETS: 194 10*3/uL (ref 150–440)
RBC: 4.79 MIL/uL (ref 3.80–5.20)
RDW: 13 % (ref 11.5–14.5)
WBC: 5.7 10*3/uL (ref 3.6–11.0)

## 2017-06-17 MED ORDER — ASPIRIN EC 325 MG PO TBEC: 325.0000 mg | DELAYED_RELEASE_TABLET | Freq: Every day | ORAL | 3 refills | Status: AC

## 2017-06-17 MED ORDER — AMLODIPINE BESYLATE 5 MG PO TABS: 5.0000 mg | ORAL_TABLET | Freq: Every day | ORAL | 1 refills | Status: DC

## 2017-06-17 NOTE — ED Provider Notes (Signed)
[facility] Emergency Department Provider Note    First MD Initiated Contact with Patient 06/17/17 1253     (approximate)  I have reviewed the triage vital signs and the nursing notes.   HISTORY  Chief Complaint Dizziness and Blurred Vision    HPI Adriana Berg is a 62 y.o. female with a history of borderline diabetes, hypertension and 1/2 pack/day smoking history presents to the ER with chief complaint of blurry vision and left-sided numbness and tingling which is present when she awoke from sleep this morning.  Last felt normal last night.  Denies any personal history of stroke.  Says she was having blurry vision and felt mild headache so she thought that her blood pressure was elevated so she went to check and her blood pressure was indeed elevated greater than 462 systolic.  Denied any chest pain with this.  No nausea or vomiting.  States that the numbness and tingling in the left side particularly left fingers and left lower leg lasted roughly 20 minutes and then resolved.  Still has some blurry vision.  No dizziness and no pain at this time.    Past Medical History:  Diagnosis Date  . Borderline diabetes   . Breast cancer ([place]) 2007   RT LUMPECTOMY  . Cancer ([place])   . Personal history of malignant neoplasm of breast    invasive ductal carcinoma with tubular features,Stage 1,ER pos,HER2 neg  . Personal history of radiation therapy 2007  . Personal history of tobacco use, presenting hazards to health   . Pneumonia Feb 2017  . Radiation 2007   BREAST CA  . Unspecified essential hypertension    Family History  Problem Relation Age of Onset  . Cancer Sister   . Breast cancer Sister 11  . Diabetes Mellitus II Mother   . CAD Father   . Stroke Brother    Past Surgical History:  Procedure Laterality Date  . ABDOMINAL HYSTERECTOMY  1995  . AXILLARY SENTINEL NODE BIOPSY Right 2007  . BREAST BIOPSY Right 2007   CORE - POS  . BREAST BIOPSY Right  1995   EXCISIONAL - NEG  . BREAST SURGERY  2007   right lumpectomy  . COLONOSCOPY  12/27/2012   Patient Active Problem List   Diagnosis Date Noted  . Tobacco abuse 01/30/2015  . CAP (community acquired pneumonia) 01/27/2015  . SIRS (systemic inflammatory response syndrome) ([place]) 01/27/2015  . HTN (hypertension) 01/27/2015  . History of breast cancer 10/27/2012  . Personal history of colonic polyps 10/27/2012      Prior to Admission medications   Medication Sig Start Date End Date Taking? Authorizing Provider  albuterol (PROVENTIL HFA;VENTOLIN HFA) 108 (90 Base) MCG/ACT inhaler Inhale 2 puffs into the lungs every 6 (six) hours as needed for wheezing or shortness of breath. 01/19/17   Laban Emperor, PA-C  albuterol (PROVENTIL) (2.5 MG/3ML) 0.083% nebulizer solution Take 3 mLs (2.5 mg total) by nebulization every 6 (six) hours as needed for wheezing or shortness of breath. 01/19/17   Laban Emperor, PA-C  quinapril (ACCUPRIL) 40 MG tablet Take 1 tablet by mouth every morning.  09/02/12   [provider]    Allergies Azithromycin    Social History Social History   Tobacco Use  . Smoking status: Current Some Day Smoker    Packs/day: 0.50    Years: 20.00    Pack years: 10.00    Types: Cigarettes  . Smokeless tobacco: Never Used  Substance Use Topics  .  Alcohol use: No  . Drug use: No    Review of Systems Patient denies headaches, rhinorrhea, blurry vision, numbness, shortness of breath, chest pain, edema, cough, abdominal pain, nausea, vomiting, diarrhea, dysuria, fevers, rashes or hallucinations unless otherwise stated above in HPI. ____________________________________________   PHYSICAL EXAM:  VITAL SIGNS: Vitals:   06/17/17 1132  BP: (!) 197/83  Pulse: 64  Resp: 16  Temp: 98 F (36.7 C)  SpO2: 99%    Constitutional: Alert and oriented.  Eyes: Conjunctivae are normal.  Head: Atraumatic. Nose: No congestion/rhinnorhea. Mouth/Throat: Mucous  membranes are moist.   Neck: No stridor. Painless ROM.  Cardiovascular: Normal rate, regular rhythm. Grossly normal heart sounds.  Good peripheral circulation. Respiratory: Normal respiratory effort.  No retractions. Lungs CTAB. Gastrointestinal: Soft and nontender. No distention. No abdominal bruits. No CVA tenderness. Genitourinary:  Musculoskeletal: No lower extremity tenderness nor edema.  No joint effusions. Neurologic:  CN intact with acception of faint left facial droop.  Faint drift on LUE Normal FNF.  Normal heel to shin.  Sensation intact bilaterally. Normal speech and language. No gross focal neurologic deficits are appreciated. No gait instability. Skin:  Skin is warm, dry and intact. No rash noted. Psychiatric: Mood and affect are normal. Speech and behavior are normal.  ____________________________________________   LABS (all labs ordered are listed, but only abnormal results are displayed)  Results for orders placed or performed during the hospital encounter of 06/17/17 (from the past 24 hour(s))  Basic metabolic panel     Status: Abnormal   Collection Time: 06/17/17 11:49 AM  Result Value Ref Range   Sodium 139 135 - 145 mmol/L   Potassium 4.0 3.5 - 5.1 mmol/L   Chloride 103 101 - 111 mmol/L   CO2 26 22 - 32 mmol/L   Glucose, Bld 101 (H) 65 - 99 mg/dL   BUN 9 6 - 20 mg/dL   Creatinine, Ser 0.82 0.44 - 1.00 mg/dL   Calcium 9.1 8.9 - 10.3 mg/dL   GFR calc non Af Amer >60 >60 mL/min   GFR calc Af Amer >60 >60 mL/min   Anion gap 10 5 - 15  CBC     Status: None   Collection Time: 06/17/17 11:49 AM  Result Value Ref Range   WBC 5.7 3.6 - 11.0 K/uL   RBC 4.79 3.80 - 5.20 MIL/uL   Hemoglobin 14.9 12.0 - 16.0 g/dL   HCT 43.8 35.0 - 47.0 %   MCV 91.5 80.0 - 100.0 fL   MCH 31.1 26.0 - 34.0 pg   MCHC 34.0 32.0 - 36.0 g/dL   RDW 13.0 11.5 - 14.5 %   Platelets 194 150 - 440 K/uL  Urinalysis, Complete w Microscopic     Status: Abnormal   Collection Time: 06/17/17 11:49 AM   Result Value Ref Range   Color, Urine YELLOW (A) YELLOW   APPearance CLEAR (A) CLEAR   Specific Gravity, Urine 1.010 1.005 - 1.030   pH 7.0 5.0 - 8.0   Glucose, UA NEGATIVE NEGATIVE mg/dL   Hgb urine dipstick SMALL (A) NEGATIVE   Bilirubin Urine NEGATIVE NEGATIVE   Ketones, ur NEGATIVE NEGATIVE mg/dL   Protein, ur NEGATIVE NEGATIVE mg/dL   Nitrite NEGATIVE NEGATIVE   Leukocytes, UA NEGATIVE NEGATIVE   RBC / HPF 6-10 0 - 5 RBC/hpf   WBC, UA 0-5 0 - 5 WBC/hpf   Bacteria, UA NONE SEEN NONE SEEN   Squamous Epithelial / LPF 0-5 0 - 5   ____________________________________________  EKG My review and personal interpretation at Time: 11:37   Indication: dizziness  Rate: 60  Rhythm: sinus Axis: left Other: normal intervals, t wave inversions laterally consistent with previous.   ____________________________________________  RADIOLOGY  I personally reviewed all radiographic images ordered to evaluate for the above acute complaints and reviewed radiology reports and findings.  These findings were personally discussed with the patient.  Please see medical record for radiology report.  ____________________________________________   PROCEDURES  Procedure(s) performed:  Procedures    Critical Care performed: no ____________________________________________   INITIAL IMPRESSION / ASSESSMENT AND PLAN / ED COURSE  Pertinent labs & imaging results that were available during my care of the patient were reviewed by me and considered in my medical decision making (see chart for details).   DDX: htnive emergency, urgency, acs, cva, tia  BRENLEY PRIORE is a 62 y.o. who presents to the ED with symptoms as described above.  Patient does have some minor symptoms but NIH of essentially 0, may be ordered to receive 1 based on her presentation.  She is outside of the window for not clinically consistent with a code stroke.  Blood work is reassuring.  Patient does have risk factors for CVA.  Have  consulted hospitalist as her CT imaging of her head shows no acute abnormality.  After discussion with patient and hospitalist the patient does not want to stay in the hospital will order MRI to further evaluate for TIA.  Clinical Course as of Jun 17 2052  Thu Jun 17, 2017  1840 MRI shows no acute abnormality.  Carotids are also reassuring.  At this point patient is stable and appropriate for outpatient follow-up.  Will give referral to neurology.   [PR]    Clinical Course User Index [PR] Merlyn Lot, MD     As part of my medical decision making, I reviewed the following data within the [place] notes reviewed and incorporated, Labs reviewed, notes from prior ED visits and [place] Controlled Substance Database   ____________________________________________   FINAL CLINICAL IMPRESSION(S) / ED DIAGNOSES  Final diagnoses:  Tingling of left arm and left side of face  Hypertension, unspecified type      NEW MEDICATIONS STARTED DURING THIS VISIT:  New Prescriptions   No medications on file     Note:  This document was prepared using Dragon voice recognition software and may include unintentional dictation errors.    Merlyn Lot, MD 06/17/17 2055

## 2017-06-17 NOTE — ED Notes (Signed)
MRI estimates 45 mins until testing

## 2017-06-17 NOTE — Consult Note (Signed)
[facility] [place] NAME: Adriana Berg    MR#:  [id number]  DATE OF BIRTH:  [birth year]  DATE OF ADMISSION:  06/17/2017  PRIMARY CARE PHYSICIAN: Denton Lank, MD   REQUESTING/REFERRING PHYSICIAN: Dr. Merlyn Lot  CHIEF COMPLAINT:   Chief Complaint  Patient presents with  . Dizziness  . Blurred Vision    HISTORY OF PRESENT ILLNESS:  Adriana Berg  is a 62 y.o. female with a known history of breast cancer status post lumpectomy and radiation therapy, ongoing smoking, hypertension presents to hospital secondary to blurred vision and left arm tingling that started this morning. Patient states she woke up fine, but within minutes after waking up she started noticing left-sided headache associated with blurred vision.  She denies any double vision.  Also started experiencing left arm tingling and numbness and minimal tingling of the left foot.  No prior history of strokes or mini strokes.  CT of the head in the emergency room is completely normal.  No history of migraines.  Blood pressure was noted to be significantly elevated with systolic blood pressure in the 190s.  Once her blood pressure has improved, symptoms are almost resolved.  The left arm feels normal though the blurred vision is not completely gone.  She is a prediabetic and had her eyes checked last year and they were normal.  No other complaints at this time. Medical consult was recommended.  PAST MEDICAL HISTORY:   Past Medical History:  Diagnosis Date  . Borderline diabetes   . Breast cancer ([place]) 2007   RT LUMPECTOMY  . Cancer ([place])   . Personal history of malignant neoplasm of breast    invasive ductal carcinoma with tubular features,Stage 1,ER pos,HER2 neg  . Personal history of radiation therapy 2007  . Personal history of tobacco use, presenting hazards to health   . Pneumonia Feb 2017  . Radiation 2007   BREAST CA  . Unspecified essential hypertension     PAST  SURGICAL HISTOIRY:   Past Surgical History:  Procedure Laterality Date  . ABDOMINAL HYSTERECTOMY  1995  . AXILLARY SENTINEL NODE BIOPSY Right 2007  . BREAST BIOPSY Right 2007   CORE - POS  . BREAST BIOPSY Right 1995   EXCISIONAL - NEG  . BREAST SURGERY  2007   right lumpectomy  . COLONOSCOPY  12/27/2012    SOCIAL HISTORY:   Social History   Tobacco Use  . Smoking status: Current Some Day Smoker    Packs/day: 0.50    Years: 20.00    Pack years: 10.00    Types: Cigarettes  . Smokeless tobacco: Never Used  Substance Use Topics  . Alcohol use: No    FAMILY HISTORY:   Family History  Problem Relation Age of Onset  . Cancer Sister   . Breast cancer Sister 47  . Diabetes Mellitus II Mother   . CAD Father   . Stroke Brother     DRUG ALLERGIES:   Allergies  Allergen Reactions  . Azithromycin Swelling    REVIEW OF SYSTEMS:   CONSTITUTIONAL: No fever, fatigue or weakness.  EYES: No blurred or double vision.  EARS, NOSE, AND THROAT: No tinnitus or ear pain.  RESPIRATORY: No cough, shortness of breath, wheezing or hemoptysis.  CARDIOVASCULAR: No chest pain, orthopnea, edema.  GASTROINTESTINAL: No nausea, vomiting, diarrhea or abdominal pain.  GENITOURINARY: No dysuria, hematuria.  ENDOCRINE: No polyuria, nocturia,  HEMATOLOGY: No anemia, easy bruising or bleeding SKIN:  No rash or lesion. MUSCULOSKELETAL: No joint pain or arthritis.   NEUROLOGIC: Complains of headache, blurred vision and left arm tingling.  No focal weakness.  No dizziness.  No tremors or seizures. PSYCHIATRY: No anxiety or depression.   MEDICATIONS AT HOME:   Prior to Admission medications   Medication Sig Start Date End Date Taking? Authorizing Provider  albuterol (PROVENTIL HFA;VENTOLIN HFA) 108 (90 Base) MCG/ACT inhaler Inhale 2 puffs into the lungs every 6 (six) hours as needed for wheezing or shortness of breath. 01/19/17   Laban Emperor, PA-C  albuterol (PROVENTIL) (2.5 MG/3ML) 0.083%  nebulizer solution Take 3 mLs (2.5 mg total) by nebulization every 6 (six) hours as needed for wheezing or shortness of breath. 01/19/17   Laban Emperor, PA-C  quinapril (ACCUPRIL) 40 MG tablet Take 1 tablet by mouth every morning.  09/02/12   [provider]      VITAL SIGNS:  Blood pressure (!) 156/69, pulse (!) 56, temperature 98 F (36.7 C), temperature source Oral, resp. rate 16, height '5\' 2"'  (1.575 m), weight 72.6 kg (160 lb), SpO2 100 %.  PHYSICAL EXAMINATION:   GENERAL:  62 y.o.-year-old patient lying in the bed with no acute distress.  EYES: Pupils equal, round, reactive to light and accommodation. No scleral icterus. Extraocular muscles intact.  HEENT: Head atraumatic, normocephalic. Oropharynx and nasopharynx clear.  NECK:  Supple, no jugular venous distention. No thyroid enlargement, no tenderness.  LUNGS: Normal breath sounds bilaterally, no wheezing, rales,rhonchi or crepitation. No use of accessory muscles of respiration.  CARDIOVASCULAR: S1, S2 normal. No murmurs, rubs, or gallops.  ABDOMEN: Soft, nontender, nondistended. Bowel sounds present. No organomegaly or mass.  EXTREMITIES: No pedal edema, cyanosis, or clubbing.  NEUROLOGIC: Cranial nerves II through XII are intact. No nystagmus, normal visual acuity. Muscle strength 5/5 in all extremities. Sensation intact. Gait not checked.  PSYCHIATRIC: The patient is alert and oriented x 3.  SKIN: No obvious rash, lesion, or ulcer.   LABORATORY PANEL:   CBC Recent Labs  Lab 06/17/17 1149  WBC 5.7  HGB 14.9  HCT 43.8  PLT 194   ------------------------------------------------------------------------------------------------------------------  Chemistries  Recent Labs  Lab 06/17/17 1149  NA 139  K 4.0  CL 103  CO2 26  GLUCOSE 101*  BUN 9  CREATININE 0.82  CALCIUM 9.1   ------------------------------------------------------------------------------------------------------------------  Cardiac  Enzymes No results for input(s): TROPONINI in the last 168 hours. ------------------------------------------------------------------------------------------------------------------  RADIOLOGY:  Ct Head Wo Contrast  Result Date: 06/17/2017 CLINICAL DATA:  Blurred vision and dizziness since waking up this morning, slight headache, history hypertension, breast cancer, smoker EXAM: CT HEAD WITHOUT CONTRAST TECHNIQUE: Contiguous axial images were obtained from the base of the skull through the vertex without intravenous contrast. Sagittal and coronal MPR images reconstructed from axial data set. COMPARISON:  08/07/2006 FINDINGS: Brain: Normal ventricular morphology. No midline shift or mass effect. Normal appearance of brain parenchyma. No intracranial hemorrhage, mass lesion, evidence of acute infarction, or extra-axial fluid collection. Vascular: Normal appearance Skull: Intact Sinuses/Orbits: Clear Other: N/A IMPRESSION: Normal exam. Electronically Signed   By: Lavonia Dana M.D.   On: 06/17/2017 13:32    EKG:   Orders placed or performed during the hospital encounter of 06/17/17  . ED EKG  . ED EKG    IMPRESSION AND PLAN:   Adriana Berg  is a 62 y.o. female with a known history of breast cancer status post lumpectomy and radiation therapy, ongoing smoking, hypertension presents to hospital secondary to blurred vision and  left arm tingling that started this morning.  1.  Blurred vision with left hand tingling-concern for mini stroke versus neurological changes with hypertensive urgency. -Does have risk factors of smoking and hypertension -CT of the head is completely normal without any chronic small vessel ischemic changes. -Check a lipid panel.  Carotid Dopplers ordered.  MRI of the brain has been ordered. -If the MRI is positive-we will admit for further stroke work-up. -If negative, can discharge with outpatient follow-up.  Advised adding aspirin at discharge and adding blood pressure  medicine for better control of blood pressure.  2.Hypertensive urgency- on quinapril, BP lately running high Recommend adding another med- norvasc   3. Prediabetic-postprandial sugar of only 101.  Continue outpatient follow-up  4.  COPD-stable.  Continue home inhalers  Discussed with Dr. Quentin Cornwall.  Patient agreeable with the plan.   All the records are reviewed and case discussed with Consulting provider. Management plans discussed with the patient, family and they are in agreement.  CODE STATUS: Full Code  TOTAL TIME TAKING CARE OF THIS PATIENT: 50 minutes.    Gladstone Lighter M.D on 06/17/2017 at 2:03 PM  Between 7am to 6pm - Pager - [phone]  After 6pm go to [email or web address] - Proofreader  [facility]  Office  [phone]  CC: Primary care Physician: Denton Lank, MD   Note: This dictation was prepared with Dragon dictation along with smaller phrase technology. Any transcriptional errors that result from this process are unintentional.

## 2017-06-17 NOTE — ED Notes (Signed)
Patient transported to CT 

## 2017-06-17 NOTE — ED Notes (Signed)
Patient transported to MRI 

## 2017-06-17 NOTE — ED Triage Notes (Signed)
Patient reports having blurry vision and dizziness since waking up this AM. Has slight headache. Speech clear. Denies trouble swallowing. Patient takes HTN meds and did take today. Ambulatory with no problems.

## 2017-06-17 NOTE — ED Notes (Signed)
Sandwich box given to pt. 

## 2017-10-15 ENCOUNTER — Telehealth: Payer: Self-pay | Admitting: *Deleted

## 2017-10-15 ENCOUNTER — Encounter: Payer: Self-pay | Admitting: *Deleted

## 2017-10-15 DIAGNOSIS — Z122 Encounter for screening for malignant neoplasm of respiratory organs: Secondary | ICD-10-CM

## 2017-10-15 NOTE — Telephone Encounter (Signed)
Received a referral for initial lung cancer screening scan.  Contacted the patient and obtained their smoking history, current smoker 0.25-0.5ppd with 30.75pkyr history   as well as answering questions related to screening process.  Patient denies signs of lung cancer such as weight loss or hemoptysis at this time.  Patient denies comorbidity that would prevent curative treatment if lung cancer were found.  Patient is scheduled for the Shared Decision Making Visit and CT scan on 11-11-17@1415  .

## 2017-11-11 ENCOUNTER — Inpatient Hospital Stay: Admission: RE | Admit: 2017-11-11 | Payer: 59 | Source: Ambulatory Visit

## 2017-11-11 ENCOUNTER — Ambulatory Visit
Admission: RE | Admit: 2017-11-11 | Discharge: 2017-11-11 | Disposition: A | Discharge status: Home / Self Care | Payer: Commercial Managed Care - HMO | Source: Ambulatory Visit | Attending: Oncology | Admitting: Oncology

## 2017-11-11 ENCOUNTER — Inpatient Hospital Stay: Payer: 59 | Attending: Oncology | Admitting: Oncology

## 2017-11-11 DIAGNOSIS — F17210 Nicotine dependence, cigarettes, uncomplicated: Secondary | ICD-10-CM | POA: Diagnosis present

## 2017-11-11 DIAGNOSIS — R918 Other nonspecific abnormal finding of lung field: Secondary | ICD-10-CM | POA: Diagnosis not present

## 2017-11-11 DIAGNOSIS — J438 Other emphysema: Secondary | ICD-10-CM | POA: Diagnosis not present

## 2017-11-11 DIAGNOSIS — J432 Centrilobular emphysema: Secondary | ICD-10-CM | POA: Insufficient documentation

## 2017-11-11 DIAGNOSIS — I700 Atherosclerosis of aorta: Secondary | ICD-10-CM | POA: Insufficient documentation

## 2017-11-11 DIAGNOSIS — Z122 Encounter for screening for malignant neoplasm of respiratory organs: Secondary | ICD-10-CM | POA: Diagnosis not present

## 2017-11-11 DIAGNOSIS — Z87891 Personal history of nicotine dependence: Secondary | ICD-10-CM | POA: Diagnosis not present

## 2017-11-11 NOTE — Progress Notes (Signed)
In accordance with CMS guidelines, patient has met eligibility criteria including age, absence of signs or symptoms of lung cancer.  Social History   Tobacco Use  . Smoking status: Current Some Day Smoker    Packs/day: 0.75    Years: 41.00    Pack years: 30.75    Types: Cigarettes  . Smokeless tobacco: Never Used  Substance Use Topics  . Alcohol use: No  . Drug use: No     A shared decision-making session was conducted prior to the performance of CT scan. This includes one or more decision aids, includes benefits and harms of screening, follow-up diagnostic testing, over-diagnosis, false positive rate, and total radiation exposure.  Counseling on the importance of adherence to annual lung cancer LDCT screening, impact of co-morbidities, and ability or willingness to undergo diagnosis and treatment is imperative for compliance of the program.  Counseling on the importance of continued smoking cessation for former smokers; the importance of smoking cessation for current smokers, and information about tobacco cessation interventions have been given to patient including [place] and [facility] quit [place] programs.  Written order for lung cancer screening with LDCT has been given to the patient and any and all questions have been answered to the best of my abilities.   Yearly follow up will be coordinated by Burgess Estelle, Thoracic Navigator.  Faythe Casa, NP 11/11/2017 2:56 PM

## 2017-11-15 DIAGNOSIS — M542 Cervicalgia: Secondary | ICD-10-CM | POA: Insufficient documentation

## 2017-11-15 DIAGNOSIS — R200 Anesthesia of skin: Secondary | ICD-10-CM | POA: Insufficient documentation

## 2017-11-16 ENCOUNTER — Encounter: Payer: Self-pay | Admitting: *Deleted

## 2018-03-08 ENCOUNTER — Other Ambulatory Visit: Payer: Self-pay

## 2018-03-08 DIAGNOSIS — Z1231 Encounter for screening mammogram for malignant neoplasm of breast: Secondary | ICD-10-CM

## 2018-04-26 ENCOUNTER — Ambulatory Visit: Payer: 59 | Admitting: General Surgery

## 2018-05-17 ENCOUNTER — Ambulatory Visit: Payer: 59 | Admitting: General Surgery

## 2018-06-14 ENCOUNTER — Other Ambulatory Visit: Payer: Self-pay

## 2018-06-14 ENCOUNTER — Ambulatory Visit
Admission: RE | Admit: 2018-06-14 | Discharge: 2018-06-14 | Disposition: A | Discharge status: Home / Self Care | Payer: 59 | Source: Ambulatory Visit | Attending: General Surgery | Admitting: General Surgery

## 2018-06-14 DIAGNOSIS — Z1231 Encounter for screening mammogram for malignant neoplasm of breast: Secondary | ICD-10-CM | POA: Diagnosis present

## 2018-06-21 ENCOUNTER — Ambulatory Visit: Payer: 59 | Admitting: General Surgery

## 2018-06-21 ENCOUNTER — Encounter: Payer: Self-pay | Admitting: General Surgery

## 2018-06-21 ENCOUNTER — Other Ambulatory Visit: Payer: Self-pay

## 2018-06-21 VITALS — BP 169/83 | HR 74 | Temp 97.3°F | Resp 16 | Ht 62.0 in | Wt 155.0 lb

## 2018-06-21 DIAGNOSIS — Z853 Personal history of malignant neoplasm of breast: Secondary | ICD-10-CM | POA: Diagnosis not present

## 2018-06-21 NOTE — Progress Notes (Signed)
Patient ID: Adriana Berg, female   DOB: July 03, 1955, 63 y.o.   MRN: [id number]  Chief Complaint  Patient presents with  . Follow-up    f/u recall bil screening mammo [facility] 06/14/18    HPI Adriana Berg is a 63 y.o. female.  Here for follow up right breast cancer and mammogram, completed on 06-14-18. No new breast issues. HPI  Past Medical History:  Diagnosis Date  . Borderline diabetes   . Breast cancer ([place]) 2007   RT LUMPECTOMY  . Cancer ([place])   . Personal history of malignant neoplasm of breast    invasive ductal carcinoma with tubular features,Stage 1,ER pos,HER2 neg  . Personal history of radiation therapy 2007  . Personal history of tobacco use, presenting hazards to health   . Pneumonia Feb 2017  . Radiation 2007   BREAST CA  . Unspecified essential hypertension     Past Surgical History:  Procedure Laterality Date  . ABDOMINAL HYSTERECTOMY  1995  . AXILLARY SENTINEL NODE BIOPSY Right 2007  . BREAST BIOPSY Right 2007   CORE - POS  . BREAST BIOPSY Right 1995   EXCISIONAL - NEG  . BREAST LUMPECTOMY Right 2007   breast ca  . BREAST SURGERY  2007   right lumpectomy  . COLONOSCOPY  12/27/2012    Family History  Problem Relation Age of Onset  . Cancer Sister   . Breast cancer Sister 51  . Diabetes Mellitus II Mother   . CAD Father   . Stroke Brother     Social History Social History   Tobacco Use  . Smoking status: Current Some Day Smoker    Packs/day: 0.75    Years: 41.00    Pack years: 30.75    Types: Cigarettes  . Smokeless tobacco: Never Used  Substance Use Topics  . Alcohol use: No  . Drug use: No    Allergies  Allergen Reactions  . Azithromycin Swelling    Current Outpatient Medications  Medication Sig Dispense Refill  . albuterol (PROVENTIL HFA;VENTOLIN HFA) 108 (90 Base) MCG/ACT inhaler Inhale 2 puffs into the lungs every 6 (six) hours as needed for wheezing or shortness of breath. 1 Inhaler 0  . albuterol (PROVENTIL) (2.5 MG/3ML) 0.083%  nebulizer solution Take 3 mLs (2.5 mg total) by nebulization every 6 (six) hours as needed for wheezing or shortness of breath. 75 mL 0  . clindamycin (CLEOCIN T) 1 % lotion Apply 1 application topically every morning.    . quinapril (ACCUPRIL) 40 MG tablet Take 1 tablet by mouth every morning.     Marland Kitchen spironolactone (ALDACTONE) 25 MG tablet Take 25 mg by mouth daily.    . Vitamin D, Ergocalciferol, (DRISDOL) 50000 units CAPS capsule Take 50,000 Units by mouth every 7 (seven) days.  0  . amLODipine (NORVASC) 5 MG tablet Take 1 tablet (5 mg total) by mouth daily. 30 tablet 1   No current facility-administered medications for this visit.     Review of Systems Review of Systems  Constitutional: Negative.   Respiratory: Negative.   Cardiovascular: Negative.     Blood pressure (!) 169/83, pulse 74, temperature (!) 97.3 F (36.3 C), temperature source Temporal, resp. rate 16, height 5' 2" (1.575 m), weight 155 lb (70.3 kg), SpO2 98 %.  Physical Exam Physical Exam Exam conducted with a chaperone present.  Constitutional:      Appearance: She is well-developed.  Eyes:     General: No scleral icterus.    Conjunctiva/sclera: Conjunctivae  normal.  Neck:     Musculoskeletal: Neck supple.  Cardiovascular:     Rate and Rhythm: Normal rate and regular rhythm.     Heart sounds: Normal heart sounds.  Pulmonary:     Effort: Pulmonary effort is normal.     Breath sounds: Normal breath sounds.  Chest:     Breasts:        Right: No inverted nipple, mass, nipple discharge, skin change or tenderness.        Left: No inverted nipple, mass, nipple discharge, skin change or tenderness.       Comments: Right breast lumpectomy Lymphadenopathy:     Cervical: No cervical adenopathy.  Skin:    General: Skin is warm and dry.  Neurological:     Mental Status: She is alert and oriented to person, place, and time.  Psychiatric:        Behavior: Behavior normal.     Data Reviewed Bilateral screening  mammograms dated June 14, 2018 were independently reviewed.  Postsurgical changes.  Dense calcification in the area of palpable thickening.  BI-RADS-1.  Assessment Benign breast exam.  Redundant axillary soft tissue, candidate for resection if symptomatic.  Plan   The patient is aware to call back for any questions or new concerns. Patient will be asked to return to the office in one year with a bilateral screening mammogram.   HPI, assessment, plan and physical exam has been scribed under the direction and in the presence of Robert Bellow, MD. Karie Fetch, RN  I have completed the exam and reviewed the above documentation for accuracy and completeness.  I agree with the above.  Haematologist has been used and any errors in dictation or transcription are unintentional.  Hervey Ard, M.D., F.A.C.S.   Forest Gleason Mckinnley Cottier 06/22/2018, 3:06 PM

## 2018-06-21 NOTE — Patient Instructions (Addendum)
The patient is aware to call back for any questions or new concerns. Patient will be asked to return to the office in one year with a bilateral screening mammogram.  

## 2018-08-05 ENCOUNTER — Encounter: Payer: Self-pay | Admitting: General Surgery

## 2018-11-11 ENCOUNTER — Encounter: Payer: Self-pay | Admitting: *Deleted

## 2018-11-11 ENCOUNTER — Telehealth: Payer: Self-pay | Admitting: *Deleted

## 2018-11-11 NOTE — Telephone Encounter (Signed)
Left message for patient to notify them that it is time to schedule annual low dose lung cancer screening CT scan. Instructed patient to call back to verify information prior to the scan being scheduled.  

## 2018-11-30 ENCOUNTER — Telehealth: Payer: Self-pay

## 2018-11-30 DIAGNOSIS — Z87891 Personal history of nicotine dependence: Secondary | ICD-10-CM

## 2018-11-30 DIAGNOSIS — Z122 Encounter for screening for malignant neoplasm of respiratory organs: Secondary | ICD-10-CM

## 2018-11-30 NOTE — Telephone Encounter (Signed)
Spoke with pt and informed her that it is time for her annual lung cancer screening. Confirmed pt's smoking history .75packs/day for 42 years. Current smoker, states she smokes about 4 cigarettes a day. Pt denies any health changes in the last year and is agreeable to having CT scan scheduled at this time. Pt states that she prefers morning appts.

## 2018-12-06 DIAGNOSIS — U071 COVID-19: Secondary | ICD-10-CM

## 2018-12-06 HISTORY — DX: COVID-19: U07.1

## 2018-12-07 NOTE — Addendum Note (Signed)
Addended by: Lieutenant Diego on: 12/07/2018 12:04 PM   Modules accepted: Orders

## 2018-12-07 NOTE — Telephone Encounter (Signed)
Smoking history, current, 31 pack year

## 2018-12-08 ENCOUNTER — Telehealth: Payer: Self-pay

## 2018-12-08 ENCOUNTER — Other Ambulatory Visit: Payer: Self-pay

## 2018-12-08 DIAGNOSIS — Z86010 Personal history of colonic polyps: Secondary | ICD-10-CM

## 2018-12-08 MED ORDER — NA SULFATE-K SULFATE-MG SULF 17.5-3.13-1.6 GM/177ML PO SOLN: 1.0000 | Freq: Once | ORAL | 0 refills | Status: AC

## 2018-12-08 NOTE — Telephone Encounter (Signed)
Gastroenterology Pre-Procedure Review  Request Date: 12/23/18 Requesting Physician: Dr. Vicente Males  PATIENT REVIEW QUESTIONS: The patient responded to the following health history questions as indicated:    1. Are you having any GI issues? no 2. Do you have a personal history of Polyps? yes (pt states her last colonoscopy she had polyps) 3. Do you have a family history of Colon Cancer or Polyps? no 4. Diabetes Mellitus? no 5. Joint replacements in the past 12 months?no 6. Major health problems in the past 3 months?no 7. Any artificial heart valves, MVP, or defibrillator?no    MEDICATIONS & ALLERGIES:    Patient reports the following regarding taking any anticoagulation/antiplatelet therapy:   Plavix, Coumadin, Eliquis, Xarelto, Lovenox, Pradaxa, Brilinta, or Effient? no Aspirin? no  Patient confirms/reports the following medications:  Current Outpatient Medications  Medication Sig Dispense Refill  . albuterol (PROVENTIL HFA;VENTOLIN HFA) 108 (90 Base) MCG/ACT inhaler Inhale 2 puffs into the lungs every 6 (six) hours as needed for wheezing or shortness of breath. 1 Inhaler 0  . albuterol (PROVENTIL) (2.5 MG/3ML) 0.083% nebulizer solution Take 3 mLs (2.5 mg total) by nebulization every 6 (six) hours as needed for wheezing or shortness of breath. 75 mL 0  . amLODipine (NORVASC) 5 MG tablet Take 1 tablet (5 mg total) by mouth daily. 30 tablet 1  . clindamycin (CLEOCIN T) 1 % lotion Apply 1 application topically every morning.    . quinapril (ACCUPRIL) 40 MG tablet Take 1 tablet by mouth every morning.     Marland Kitchen spironolactone (ALDACTONE) 25 MG tablet Take 25 mg by mouth daily.    . Vitamin D, Ergocalciferol, (DRISDOL) 50000 units CAPS capsule Take 50,000 Units by mouth every 7 (seven) days.  0   No current facility-administered medications for this visit.     Patient confirms/reports the following allergies:  Allergies  Allergen Reactions  . Azithromycin Swelling    No orders of the defined  types were placed in this encounter.   AUTHORIZATION INFORMATION Primary Insurance: 1D#: Group #:  Secondary Insurance: 1D#: Group #:  SCHEDULE INFORMATION: Date: 12/23/18 Time: Location:[facility]

## 2018-12-13 ENCOUNTER — Telehealth: Payer: Self-pay

## 2018-12-13 ENCOUNTER — Ambulatory Visit: Admission: RE | Admit: 2018-12-13 | Payer: 59 | Source: Ambulatory Visit

## 2018-12-13 NOTE — Telephone Encounter (Signed)
Patient contacted the office because she has tested positive for COVID.  Her colonoscopy was scheduled on 12/23/18 with Dr. Vicente Males.  Adriana Berg has been informed that we will need to cancel her colonoscopy.  I will call her back after Christmas to schedule.  Thanks, Sharyn Lull

## 2018-12-18 ENCOUNTER — Ambulatory Visit
Admission: EM | Admit: 2018-12-18 | Discharge: 2018-12-18 | Disposition: A | Discharge status: Home / Self Care | Payer: Worker's Compensation | Attending: Family Medicine | Admitting: Family Medicine

## 2018-12-18 ENCOUNTER — Other Ambulatory Visit: Payer: Self-pay

## 2018-12-18 ENCOUNTER — Encounter: Payer: Self-pay | Admitting: Emergency Medicine

## 2018-12-18 DIAGNOSIS — Z23 Encounter for immunization: Secondary | ICD-10-CM

## 2018-12-18 DIAGNOSIS — W230XXA Caught, crushed, jammed, or pinched between moving objects, initial encounter: Secondary | ICD-10-CM

## 2018-12-18 DIAGNOSIS — S61212A Laceration without foreign body of right middle finger without damage to nail, initial encounter: Secondary | ICD-10-CM

## 2018-12-18 MED ORDER — TETANUS-DIPHTH-ACELL PERTUSSIS 5-2.5-18.5 LF-MCG/0.5 IM SUSP
0.5000 mL | Freq: Once | INTRAMUSCULAR | Status: AC
  Administered 2018-12-18: 0.5 mL via INTRAMUSCULAR

## 2018-12-18 NOTE — ED Triage Notes (Signed)
Pt states that she was in a patients room at work and caught her finger in the door and cut her middle finger on her right hand. This occurred about 11:30 today. She is unsure when her last tetanus vaccine was last.

## 2018-12-18 NOTE — Discharge Instructions (Signed)
Keep clean.  Sutures out in 7 days.  Keep covered at work.  Take care  Dr. Lacinda Axon

## 2018-12-19 NOTE — ED Provider Notes (Signed)
[facility]    CSN: [id number] Arrival date & time: 12/18/18  1342  History   Chief Complaint Chief Complaint  Patient presents with  . Laceration    W/C   HPI  63 year old female presents with a laceration.  Patient states that she was at work.  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  She states that her finger was slammed in a door and she suffered a laceration.  Laceration is to the palmar aspect of the right middle finger.  Unsure of last tetanus.  Pain is currently 6/10 in severity.  Patient has cover the wound.  She came directly in for evaluation.  No other associated symptoms.  No other complaints or concerns at this time.  PMH, Surgical Hx, Family Hx, Social History reviewed and updated as below.  Past Medical History:  Diagnosis Date  . Borderline diabetes   . Breast cancer ([place]) 2007   RT LUMPECTOMY  . Cancer ([place])   . Personal history of malignant neoplasm of breast    invasive ductal carcinoma with tubular features,Stage 1,ER pos,HER2 neg  . Personal history of radiation therapy 2007  . Personal history of tobacco use, presenting hazards to health   . Pneumonia Feb 2017  . Radiation 2007   BREAST CA  . Unspecified essential hypertension     Patient Active Problem List   Diagnosis Date Noted  . Tobacco abuse 01/30/2015  . CAP (community acquired pneumonia) 01/27/2015  . SIRS (systemic inflammatory response syndrome) ([place]) 01/27/2015  . HTN (hypertension) 01/27/2015  . History of breast cancer 10/27/2012  . Personal history of colonic polyps 10/27/2012    Past Surgical History:  Procedure Laterality Date  . ABDOMINAL HYSTERECTOMY  1995  . AXILLARY SENTINEL NODE BIOPSY Right 2007  . BREAST BIOPSY Right 2007   CORE - POS  . BREAST BIOPSY Right 1995   EXCISIONAL - NEG  . BREAST LUMPECTOMY Right 2007   breast ca  . BREAST SURGERY  2007   right lumpectomy  . COLONOSCOPY  12/27/2012    OB History    Gravida  1   Para  1   Term      Preterm      AB      Living  1     SAB  0   TAB  0   Ectopic      Multiple      Live Births           Obstetric Comments  Age first menstrual cycle 56 Age first pregnancy 9 Last menstrual cycle 1995-hysterectomy         Home Medications    Prior to Admission medications   Medication Sig Start Date End Date Taking? Authorizing Provider  spironolactone (ALDACTONE) 25 MG tablet Take 25 mg by mouth daily. 06/17/17  Yes [provider]  Vitamin D, Ergocalciferol, (DRISDOL) 50000 units CAPS capsule Take 50,000 Units by mouth every 7 (seven) days. 06/01/17  Yes [provider]  albuterol (PROVENTIL) (2.5 MG/3ML) 0.083% nebulizer solution Take 3 mLs (2.5 mg total) by nebulization every 6 (six) hours as needed for wheezing or shortness of breath. 01/19/17 12/18/18  Laban Emperor, PA-C  amLODipine (NORVASC) 5 MG tablet Take 1 tablet (5 mg total) by mouth daily. 06/17/17 12/18/18  Merlyn Lot, MD  quinapril (ACCUPRIL) 40 MG tablet Take 1 tablet by mouth every morning.  09/02/12 12/18/18  [provider]    Family History  Family History  Problem Relation Age of Onset  . Cancer Sister   . Breast cancer Sister 20  . Diabetes Mellitus II Mother   . CAD Father   . Stroke Brother     Social History Social History   Tobacco Use  . Smoking status: Current Some Day Smoker    Packs/day: 0.75    Years: 41.00    Pack years: 30.75    Types: Cigarettes  . Smokeless tobacco: Never Used  Substance Use Topics  . Alcohol use: No  . Drug use: No     Allergies   Azithromycin   Review of Systems Review of Systems  Constitutional: Negative.   Skin: Positive for wound.   Physical Exam Triage Vital Signs ED Triage Vitals  Enc Vitals Group     BP 12/18/18 1410 (!) 209/99     Pulse Rate 12/18/18 1410 79     Resp 12/18/18 1410 18     Temp 12/18/18 1410 98.2 F (36.8 C)     Temp Source 12/18/18 1410 Oral     SpO2  12/18/18 1410 100 %     Weight 12/18/18 1404 151 lb (68.5 kg)     Height 12/18/18 1404 _0  (1.575 m)     Head Circumference --      Peak Flow --      Pain Score 12/18/18 1404 6     Pain Loc --      Pain Edu? --      Excl. in [place]? --    Updated Vital Signs BP (!) 209/99 (BP Location: Left Arm)   Pulse 79   Temp 98.2 F (36.8 C) (Oral)   Resp 18   Ht _1  (1.575 m)   Wt 68.5 kg   SpO2 100%   BMI 27.62 kg/m   Visual Acuity Right Eye Distance:   Left Eye Distance:   Bilateral Distance:    Right Eye Near:   Left Eye Near:    Bilateral Near:     Physical Exam Vitals and nursing note reviewed.  Constitutional:      General: She is not in acute distress.    Appearance: Normal appearance. She is not ill-appearing.  HENT:     Head: Normocephalic and atraumatic.  Eyes:     General:        Right eye: No discharge.        Left eye: No discharge.     Conjunctiva/sclera: Conjunctivae normal.  Pulmonary:     Effort: Pulmonary effort is normal. No respiratory distress.  Musculoskeletal:        General: Normal range of motion.  Skin:    Comments: Right middle finger -1 cm linear laceration noted on the palmar aspect at the level of the DIP joint.    Patient has another superficial laceration near the above-mentioned laceration but is well approximated and superficial.  It does not need repair.  Neurological:     General: No focal deficit present.     Mental Status: She is alert and oriented to person, place, and time.  Psychiatric:     Comments: Flat affect. Depressed mood.    UC Treatments / Results  Labs (all labs ordered are listed, but only abnormal results are displayed) Labs Reviewed - No data to display  EKG   Radiology No results found.  Procedures Laceration Repair  Date/Time: 12/19/2018 11:16 AM Performed by: Coral Spikes, DO Authorized by: Coral Spikes, DO   Consent:  Consent obtained:  Verbal   Consent given by:  Patient Anesthesia (see  MAR for exact dosages):    Anesthesia method:  Local infiltration   Local anesthetic:  Lidocaine 1% WITH epi Laceration details:    Location:  Finger   Finger location:  R long finger   Length (cm):  1 Repair type:    Repair type:  Simple Pre-procedure details:    Preparation:  Patient was prepped and draped in usual sterile fashion Exploration:    Hemostasis achieved with:  Direct pressure   Wound exploration: entire depth of wound probed and visualized   Treatment:    Area cleansed with:  Betadine   Irrigation solution:  Sterile water   Irrigation method:  Syringe Skin repair:    Repair method:  Sutures   Suture size:  5-0   Suture material:  Nylon   Suture technique:  Simple interrupted   Number of sutures:  3 Approximation:    Approximation:  Close Post-procedure details:    Dressing:  Non-adherent dressing   Patient tolerance of procedure:  Tolerated well, no immediate complications   (including critical care time)  Medications Ordered in UC Medications  Tdap (BOOSTRIX) injection 0.5 mL (0.5 mLs Intramuscular Given 12/18/18 1444)    Initial Impression / Assessment and Plan / UC Course  I have reviewed the triage vital signs and the nursing notes.  Pertinent labs & imaging results that were available during my care of the patient were reviewed by me and considered in my medical decision making (see chart for details).    63 year old female presents with a laceration.  Workmen's Comp. injury.  Workmen's Comp. form filled out.  Laceration repaired as above.  Sutures out in 7 days.  Final Clinical Impressions(s) / UC Diagnoses   Final diagnoses:  Laceration of right middle finger without foreign body without damage to nail, initial encounter     Discharge Instructions     Keep clean.  Sutures out in 7 days.  Keep covered at work.  Take care  Dr. Lacinda Axon    ED Prescriptions    None     PDMP not reviewed this encounter.   Coral Spikes, [place] 12/19/18  1118

## 2018-12-23 ENCOUNTER — Ambulatory Visit: Admit: 2018-12-23 | Payer: 59 | Admitting: Gastroenterology

## 2018-12-23 SURGERY — COLONOSCOPY WITH PROPOFOL
Anesthesia: General

## 2018-12-27 ENCOUNTER — Other Ambulatory Visit: Payer: Self-pay

## 2018-12-27 ENCOUNTER — Ambulatory Visit
Admission: RE | Admit: 2018-12-27 | Discharge: 2018-12-27 | Disposition: A | Discharge status: Home / Self Care | Payer: 59 | Source: Ambulatory Visit | Attending: Nurse Practitioner | Admitting: Nurse Practitioner

## 2018-12-27 DIAGNOSIS — Z87891 Personal history of nicotine dependence: Secondary | ICD-10-CM

## 2018-12-27 DIAGNOSIS — Z122 Encounter for screening for malignant neoplasm of respiratory organs: Secondary | ICD-10-CM | POA: Insufficient documentation

## 2018-12-29 ENCOUNTER — Encounter: Payer: Self-pay | Admitting: *Deleted

## 2019-01-26 ENCOUNTER — Telehealth: Payer: Self-pay

## 2019-01-26 ENCOUNTER — Telehealth: Payer: Self-pay | Admitting: Gastroenterology

## 2019-01-26 ENCOUNTER — Other Ambulatory Visit: Payer: Self-pay

## 2019-01-26 DIAGNOSIS — Z86010 Personal history of colonic polyps: Secondary | ICD-10-CM

## 2019-01-26 NOTE — Telephone Encounter (Signed)
-----   Message from Vanetta Mulders, [place] sent at 12/13/2018  4:04 PM EST ----- Regarding: Colonoscopy R/S to January 2021 Patient contacted the office because she has tested positive for COVID.  Her colonoscopy was scheduled on 12/23/18 with Dr. Vicente Males.  Dava has been informed that we will need to cancel her colonoscopy.  I will call her back after Christmas to schedule.  Thanks, Sharyn Lull

## 2019-01-26 NOTE — Telephone Encounter (Signed)
Gastroenterology Pre-Procedure Review  Request Date: 02/14/19 Requesting Physician: Dr. Vicente Males  PATIENT REVIEW QUESTIONS: The patient responded to the following health history questions as indicated:    1. Are you having any GI issues? no 2. Do you have a personal history of Polyps? yes (2014) 3. Do you have a family history of Colon Cancer or Polyps? no 4. Diabetes Mellitus? no 5. Joint replacements in the past 12 months?no 6. Major health problems in the past 3 months?yes (COVID 12/13/18) 7. Any artificial heart valves, MVP, or defibrillator?no    MEDICATIONS & ALLERGIES:    Patient reports the following regarding taking any anticoagulation/antiplatelet therapy:   Plavix, Coumadin, Eliquis, Xarelto, Lovenox, Pradaxa, Brilinta, or Effient? no Aspirin? no  Patient confirms/reports the following medications:  Current Outpatient Medications  Medication Sig Dispense Refill  . spironolactone (ALDACTONE) 25 MG tablet Take 25 mg by mouth daily.    . Vitamin D, Ergocalciferol, (DRISDOL) 50000 units CAPS capsule Take 50,000 Units by mouth every 7 (seven) days.  0   No current facility-administered medications for this visit.    Patient confirms/reports the following allergies:  Allergies  Allergen Reactions  . Azithromycin Swelling    No orders of the defined types were placed in this encounter.   AUTHORIZATION INFORMATION Primary Insurance: 1D#: Group #:  Secondary Insurance: 1D#: Group #:  SCHEDULE INFORMATION: Date: 02/14/19 Time: Location:[facility]

## 2019-01-26 NOTE — Telephone Encounter (Signed)
Pt left vm returning your call 

## 2019-01-26 NOTE — Telephone Encounter (Signed)
LVM for pt to call the office back if she would like to get her colonoscopy rescheduled from last year due to her having COVID.  Thanks [place], [place]

## 2019-02-02 ENCOUNTER — Encounter: Payer: Self-pay | Admitting: Gastroenterology

## 2019-02-02 ENCOUNTER — Encounter: Payer: Self-pay | Admitting: Anesthesiology

## 2019-02-10 ENCOUNTER — Telehealth: Payer: Self-pay | Admitting: Gastroenterology

## 2019-02-10 ENCOUNTER — Other Ambulatory Visit: Admission: RE | Admit: 2019-02-10 | Payer: 59 | Source: Ambulatory Visit

## 2019-02-10 ENCOUNTER — Other Ambulatory Visit: Payer: Self-pay

## 2019-02-10 DIAGNOSIS — Z86010 Personal history of colonic polyps: Secondary | ICD-10-CM

## 2019-02-10 MED ORDER — GOLYTELY 236 G PO SOLR: 4000.0000 mL | Freq: Once | ORAL | 0 refills | Status: AC

## 2019-02-10 NOTE — Telephone Encounter (Signed)
Pt left vm regarding her covid19 test

## 2019-02-10 NOTE — Telephone Encounter (Signed)
Returned patients call.  Advised that we can change her bowel prep to Golytely bowel prep.  She will need to follow mixing instructions, and begin drinking 8 oz every 30 minutes the evening prior to her colonoscopy at 5pm. Drink the entire contents.  Thanks,  [place], [place]

## 2019-02-10 NOTE — Telephone Encounter (Signed)
LVM for pt to let her know that her COVID Test should be done today, between 10:30am and 12:30pm, however the time is currently 12pm.  I've asked her to get there today by 3pm and informed her in the message that our office is currently closed, but if she has any questions to call me back on Monday.  Thanks,  [employer]

## 2019-02-10 NOTE — Telephone Encounter (Signed)
Pt left vm regarding her prep being to expensive for her procedure on 02/14/19  Please call pt

## 2019-02-13 ENCOUNTER — Encounter: Payer: Self-pay | Admitting: Gastroenterology

## 2019-02-13 NOTE — Discharge Instructions (Signed)
General Anesthesia, Adult, Care After This sheet gives you information about how to care for yourself after your procedure. Your health care provider may also give you more specific instructions. If you have problems or questions, contact your health care provider. What can I expect after the procedure? After the procedure, the following side effects are common:  Pain or discomfort at the IV site.  Nausea.  Vomiting.  Sore throat.  Trouble concentrating.  Feeling cold or chills.  Weak or tired.  Sleepiness and fatigue.  Soreness and body aches. These side effects can affect parts of the body that were not involved in surgery. Follow these instructions at home:  For at least 24 hours after the procedure:  Have a responsible adult stay with you. It is important to have someone help care for you until you are awake and alert.  Rest as needed.  Do not: ? Participate in activities in which you could fall or become injured. ? Drive. ? Use heavy machinery. ? Drink alcohol. ? Take sleeping pills or medicines that cause drowsiness. ? Make important decisions or sign legal documents. ? Take care of children on your own. Eating and drinking  Follow any instructions from your health care provider about eating or drinking restrictions.  When you feel hungry, start by eating small amounts of foods that are soft and easy to digest (bland), such as toast. Gradually return to your regular diet.  Drink enough fluid to keep your urine pale yellow.  If you vomit, rehydrate by drinking water, juice, or clear broth. General instructions  If you have sleep apnea, surgery and certain medicines can increase your risk for breathing problems. Follow instructions from your health care provider about wearing your sleep device: ? Anytime you are sleeping, including during daytime naps. ? While taking prescription pain medicines, sleeping medicines, or medicines that make you drowsy.  Return to  your normal activities as told by your health care provider. Ask your health care provider what activities are safe for you.  Take over-the-counter and prescription medicines only as told by your health care provider.  If you smoke, do not smoke without supervision.  Keep all follow-up visits as told by your health care provider. This is important. Contact a health care provider if:  You have nausea or vomiting that does not get better with medicine.  You cannot eat or drink without vomiting.  You have pain that does not get better with medicine.  You are unable to pass urine.  You develop a skin rash.  You have a fever.  You have redness around your IV site that gets worse. Get help right away if:  You have difficulty breathing.  You have chest pain.  You have blood in your urine or stool, or you vomit blood. Summary  After the procedure, it is common to have a sore throat or nausea. It is also common to feel tired.  Have a responsible adult stay with you for the first 24 hours after general anesthesia. It is important to have someone help care for you until you are awake and alert.  When you feel hungry, start by eating small amounts of foods that are soft and easy to digest (bland), such as toast. Gradually return to your regular diet.  Drink enough fluid to keep your urine pale yellow.  Return to your normal activities as told by your health care provider. Ask your health care provider what activities are safe for you. This information is not   intended to replace advice given to you by your health care provider. Make sure you discuss any questions you have with your health care provider. Document Revised: 12/25/2016 Document Reviewed: 08/07/2016 Elsevier Patient Education  2020 [employer]  

## 2019-02-14 ENCOUNTER — Ambulatory Visit: Payer: 59 | Admitting: Certified Registered"

## 2019-02-14 ENCOUNTER — Encounter
Admission: RE | Disposition: A | Discharge status: Home / Self Care | Payer: Self-pay | Source: Home / Self Care | Attending: Gastroenterology

## 2019-02-14 ENCOUNTER — Encounter: Payer: Self-pay | Admitting: Gastroenterology

## 2019-02-14 ENCOUNTER — Ambulatory Visit
Admission: RE | Admit: 2019-02-14 | Discharge: 2019-02-14 | Disposition: A | Discharge status: Home / Self Care | Payer: 59 | Attending: Gastroenterology | Admitting: Gastroenterology

## 2019-02-14 ENCOUNTER — Ambulatory Visit: Admission: RE | Admit: 2019-02-14 | Payer: 59 | Source: Home / Self Care | Admitting: Gastroenterology

## 2019-02-14 ENCOUNTER — Other Ambulatory Visit: Payer: Self-pay

## 2019-02-14 DIAGNOSIS — Z1211 Encounter for screening for malignant neoplasm of colon: Secondary | ICD-10-CM | POA: Diagnosis present

## 2019-02-14 DIAGNOSIS — Z853 Personal history of malignant neoplasm of breast: Secondary | ICD-10-CM | POA: Insufficient documentation

## 2019-02-14 DIAGNOSIS — D122 Benign neoplasm of ascending colon: Secondary | ICD-10-CM | POA: Diagnosis not present

## 2019-02-14 DIAGNOSIS — I10 Essential (primary) hypertension: Secondary | ICD-10-CM | POA: Diagnosis not present

## 2019-02-14 DIAGNOSIS — Z86010 Personal history of colonic polyps: Secondary | ICD-10-CM | POA: Diagnosis not present

## 2019-02-14 DIAGNOSIS — Z8719 Personal history of other diseases of the digestive system: Secondary | ICD-10-CM | POA: Diagnosis not present

## 2019-02-14 DIAGNOSIS — Z79899 Other long term (current) drug therapy: Secondary | ICD-10-CM | POA: Insufficient documentation

## 2019-02-14 DIAGNOSIS — Z923 Personal history of irradiation: Secondary | ICD-10-CM | POA: Diagnosis not present

## 2019-02-14 DIAGNOSIS — K635 Polyp of colon: Secondary | ICD-10-CM | POA: Diagnosis not present

## 2019-02-14 DIAGNOSIS — F17210 Nicotine dependence, cigarettes, uncomplicated: Secondary | ICD-10-CM | POA: Insufficient documentation

## 2019-02-14 HISTORY — PX: COLONOSCOPY WITH PROPOFOL: SHX5780

## 2019-02-14 SURGERY — COLONOSCOPY WITH PROPOFOL
Anesthesia: General

## 2019-02-14 SURGERY — COLONOSCOPY WITH PROPOFOL
Anesthesia: Choice

## 2019-02-14 MED ORDER — PHENYLEPHRINE HCL (PRESSORS) 10 MG/ML IV SOLN
INTRAVENOUS | Status: DC | PRN
  Administered 2019-02-14 (×2): 100 ug via INTRAVENOUS

## 2019-02-14 MED ORDER — PROPOFOL 500 MG/50ML IV EMUL
INTRAVENOUS | Status: DC | PRN
  Administered 2019-02-14: 165 ug/kg/min via INTRAVENOUS

## 2019-02-14 MED ORDER — PROPOFOL 10 MG/ML IV BOLUS
INTRAVENOUS | Status: DC | PRN
  Administered 2019-02-14: 20 mg via INTRAVENOUS
  Administered 2019-02-14: 50 mg via INTRAVENOUS

## 2019-02-14 MED ORDER — SODIUM CHLORIDE 0.9 % IV SOLN: INTRAVENOUS | Status: DC

## 2019-02-14 MED ORDER — LIDOCAINE HCL (CARDIAC) PF 100 MG/5ML IV SOSY
PREFILLED_SYRINGE | INTRAVENOUS | Status: DC | PRN
  Administered 2019-02-14: 100 mg via INTRATRACHEAL

## 2019-02-14 NOTE — Anesthesia Preprocedure Evaluation (Signed)
Anesthesia Evaluation  Patient identified by MRN, date of birth, ID band Patient awake    Reviewed: Allergy & Precautions, NPO status , Patient's Chart, lab work & pertinent test results  History of Anesthesia Complications Negative for: history of anesthetic complications  Airway Mallampati: II  TM Distance: >3 FB Neck ROM: Full    Dental  (+) Poor Dentition   Pulmonary neg sleep apnea, neg COPD, Current Smoker and Patient abstained from smoking.,    breath sounds clear to auscultation- rhonchi (-) wheezing      Cardiovascular hypertension, Pt. on medications (-) CAD, (-) Past MI, (-) Cardiac Stents and (-) CABG  Rhythm:Regular Rate:Normal - Systolic murmurs and - Diastolic murmurs    Neuro/Psych neg Seizures negative neurological ROS  negative psych ROS   GI/Hepatic negative GI ROS, Neg liver ROS,   Endo/Other  negative endocrine ROSneg diabetes  Renal/GU negative Renal ROS     Musculoskeletal negative musculoskeletal ROS (+)   Abdominal (+) - obese,   Peds  Hematology negative hematology ROS (+)   Anesthesia Other Findings Past Medical History: No date: Borderline diabetes 2007: Breast cancer ([place])     Comment:  RT LUMPECTOMY No date: Cancer ([place]) 12/2018: Lab test positive for detection of COVID-19 virus No date: Personal history of malignant neoplasm of breast     Comment:  invasive ductal carcinoma with tubular features,Stage               1,ER pos,HER2 neg 2007: Personal history of radiation therapy No date: Personal history of tobacco use, presenting hazards to health Feb 2017: Pneumonia 2007: Radiation     Comment:  BREAST CA No date: Unspecified essential hypertension   Reproductive/Obstetrics                             Anesthesia Physical Anesthesia Plan  ASA: II  Anesthesia Plan: General   Post-op Pain Management:    Induction: Intravenous  PONV Risk Score  and Plan: 1 and Propofol infusion  Airway Management Planned: Natural Airway  Additional Equipment:   Intra-op Plan:   Post-operative Plan:   Informed Consent: I have reviewed the patients History and Physical, chart, labs and discussed the procedure including the risks, benefits and alternatives for the proposed anesthesia with the patient or authorized representative who has indicated his/her understanding and acceptance.     Dental advisory given  Plan Discussed with: CRNA and Anesthesiologist  Anesthesia Plan Comments:         Anesthesia Quick Evaluation

## 2019-02-14 NOTE — Anesthesia Postprocedure Evaluation (Signed)
Anesthesia Post Note  Patient: Adriana Berg  Procedure(s) Performed: COLONOSCOPY WITH PROPOFOL (N/A )  Patient location during evaluation: Endoscopy Anesthesia Type: General Level of consciousness: awake and alert and oriented Pain management: pain level controlled Vital Signs Assessment: post-procedure vital signs reviewed and stable Respiratory status: spontaneous breathing, nonlabored ventilation and respiratory function stable Cardiovascular status: blood pressure returned to baseline and stable Postop Assessment: no signs of nausea or vomiting Anesthetic complications: no     Last Vitals:  Vitals:   02/14/19 1203 02/14/19 1233  BP:  (!) 144/81  Pulse:    Resp:    Temp: (!) 36.1 C   SpO2:      Last Pain:  Vitals:   02/14/19 1233  TempSrc:   PainSc: 0-No pain                 Bethani Brugger

## 2019-02-14 NOTE — Op Note (Signed)
[facility]astroenterology Patient Name: Adriana Berg Procedure Date: 02/14/2019 11:43 AM MRN: [id number] Account #: [id number] Date of Birth: [birth year] Admit Type: Outpatient Age: 64 Room: [facility] ENDO ROOM 3 Gender: Female Note Status: Finalized Procedure:             Colonoscopy Indications:           High risk colon cancer surveillance: Personal history                         of colonic polyps Providers:             Jonathon Bellows MD, MD Referring MD:          Denton Lank MD, MD (Referring MD) Medicines:             Monitored Anesthesia Care Complications:         No immediate complications. Procedure:             Pre-Anesthesia Assessment:                        - Prior to the procedure, a History and Physical was                         performed, and patient medications, allergies and                         sensitivities were reviewed. The patient's tolerance                         of previous anesthesia was reviewed.                        - The risks and benefits of the procedure and the                         sedation options and risks were discussed with the                         patient. All questions were answered and informed                         consent was obtained.                        - ASA Grade Assessment: II - A patient with mild                         systemic disease.                        After obtaining informed consent, the colonoscope was                         passed under direct vision. Throughout the procedure,                         the patient's blood pressure, pulse, and oxygen                         saturations were monitored continuously. The  Colonoscope was introduced through the anus and                         advanced to the the cecum, identified by the                         appendiceal orifice. The colonoscopy was performed                         with ease. The patient tolerated the  procedure well.                         The quality of the bowel preparation was excellent. Findings:      The perianal and digital rectal examinations were normal.      Two sessile polyps were found in the transverse colon and ascending       colon. The polyps were 3 to 4 mm in size. These polyps were removed with       a cold biopsy forceps. Resection and retrieval were complete.      The exam was otherwise without abnormality on direct and retroflexion       views. Impression:            - Two 3 to 4 mm polyps in the transverse colon and in                         the ascending colon, removed with a cold biopsy                         forceps. Resected and retrieved.                        - The examination was otherwise normal on direct and                         retroflexion views. Recommendation:        - Discharge patient to home (with escort).                        - Resume previous diet.                        - Continue present medications.                        - Await pathology results.                        - Repeat colonoscopy in 5 years for surveillance. Procedure Code(s):     --- Professional ---                        [id number], Colonoscopy, flexible; with biopsy, single or                         multiple Diagnosis Code(s):     --- Professional ---                        Z86.010, Personal history of colonic polyps  K63.5, Polyp of colon CPT copyright 2019 American Medical Association. All rights reserved. The codes documented in this report are preliminary and upon coder review may  be revised to meet current compliance requirements. Jonathon Bellows, MD Jonathon Bellows MD, MD 02/14/2019 12:02:13 PM This report has been signed electronically. Number of Addenda: 0 Note Initiated On: 02/14/2019 11:43 AM Scope Withdrawal Time: 0 hours 8 minutes 36 seconds  Total Procedure Duration: 0 hours 10 minutes 48 seconds  Estimated Blood Loss:  Estimated blood loss:  none.      [facility]

## 2019-02-14 NOTE — H&P (Signed)
Jonathon Bellows, MD [address] [place], [place], [place], [address] [address], [place], [place], [place], [address] Phone: [phone]  Fax: [phone]  Primary Care Physician:  Denton Lank, MD   Pre-Procedure History & Physical: HPI:  Adriana Berg is a 64 y.o. female is here for an colonoscopy.   Past Medical History:  Diagnosis Date  . Borderline diabetes   . Breast cancer ([place]) 2007   RT LUMPECTOMY  . Cancer ([place])   . Lab test positive for detection of COVID-19 virus 12/2018  . Personal history of malignant neoplasm of breast    invasive ductal carcinoma with tubular features,Stage 1,ER pos,HER2 neg  . Personal history of radiation therapy 2007  . Personal history of tobacco use, presenting hazards to health   . Pneumonia Feb 2017  . Radiation 2007   BREAST CA  . Unspecified essential hypertension     Past Surgical History:  Procedure Laterality Date  . ABDOMINAL HYSTERECTOMY  1995  . AXILLARY SENTINEL NODE BIOPSY Right 2007  . BREAST BIOPSY Right 2007   CORE - POS  . BREAST BIOPSY Right 1995   EXCISIONAL - NEG  . BREAST LUMPECTOMY Right 2007   breast ca  . BREAST SURGERY  2007   right lumpectomy  . COLONOSCOPY  12/27/2012    Prior to Admission medications   Medication Sig Start Date End Date Taking? Authorizing Provider  amLODipine (NORVASC) 2.5 MG tablet Take 2.5 mg by mouth daily.   Yes [provider]  Cholecalciferol (VITAMIN D3 PO) Take 50,000 Units by mouth daily.   Yes [provider]  quinapril (ACCUPRIL) 40 MG tablet Take 40 mg by mouth at bedtime.   Yes [provider]  spironolactone (ALDACTONE) 50 MG tablet Take 50 mg by mouth daily.  06/17/17  Yes [provider]  Vitamin D, Ergocalciferol, (DRISDOL) 50000 units CAPS capsule Take 50,000 Units by mouth every 7 (seven) days. 06/01/17  Yes [provider]  VITAMIN D PO Take 1,000 Units by mouth.    [provider]  albuterol (PROVENTIL)  (2.5 MG/3ML) 0.083% nebulizer solution Take 3 mLs (2.5 mg total) by nebulization every 6 (six) hours as needed for wheezing or shortness of breath. 01/19/17 12/18/18  Laban Emperor, PA-C    Allergies as of 02/13/2019 - Review Complete 02/13/2019  Allergen Reaction Noted  . Azithromycin Swelling 02/20/2012    Family History  Problem Relation Age of Onset  . Cancer Sister   . Breast cancer Sister 50  . Diabetes Mellitus II Mother   . CAD Father   . Stroke Brother     Social History   Socioeconomic History  . Marital status: Single    Spouse name: Not on file  . Number of children: Not on file  . Years of education: Not on file  . Highest education level: Not on file  Occupational History  . Not on file  Tobacco Use  . Smoking status: Current Some Day Smoker    Packs/day: 0.25    Years: 41.00    Pack years: 10.25    Types: Cigarettes  . Smokeless tobacco: Never Used  Substance and Sexual Activity  . Alcohol use: No  . Drug use: No  . Sexual activity: Not on file  Other Topics Concern  . Not on file  Social History Narrative   Lives at home with son, Independent at baseline   Social Determinants of Health   Financial Resource Strain:   . Difficulty  of Paying Living Expenses: Not on file  Food Insecurity:   . Worried About Charity fundraiser in the Last Year: Not on file  . Ran Out of Food in the Last Year: Not on file  Transportation Needs:   . Lack of Transportation (Medical): Not on file  . Lack of Transportation (Non-Medical): Not on file  Physical Activity:   . Days of Exercise per Week: Not on file  . Minutes of Exercise per Session: Not on file  Stress:   . Feeling of Stress : Not on file  Social Connections:   . Frequency of Communication with Friends and Family: Not on file  . Frequency of Social Gatherings with Friends and Family: Not on file  . Attends Religious Services: Not on file  . Active Member of Clubs or Organizations: Not on file  .  Attends Archivist Meetings: Not on file  . Marital Status: Not on file  Intimate Partner Violence:   . Fear of Current or Ex-Partner: Not on file  . Emotionally Abused: Not on file  . Physically Abused: Not on file  . Sexually Abused: Not on file    Review of Systems: See HPI, otherwise negative ROS  Physical Exam: BP (!) 150/83   Pulse 72   Temp 97.7 F (36.5 C) (Temporal)   Resp 16   Ht '5\' 2"'  (1.575 m)   Wt 68 kg   SpO2 100%   BMI 27.44 kg/m  General:   Alert,  pleasant and cooperative in NAD Head:  Normocephalic and atraumatic. Neck:  Supple; no masses or thyromegaly. Lungs:  Clear throughout to auscultation, normal respiratory effort.    Heart:  +S1, +S2, Regular rate and rhythm, No edema. Abdomen:  Soft, nontender and nondistended. Normal bowel sounds, without guarding, and without rebound.   Neurologic:  Alert and  oriented x4;  grossly normal neurologically.  Impression/Plan: Adriana Berg is here for an colonoscopy to be performed for surveillance due to prior history of colon polyps   Risks, benefits, limitations, and alternatives regarding  colonoscopy have been reviewed with the patient.  Questions have been answered.  All parties agreeable.   Jonathon Bellows, MD  02/14/2019, 11:43 AM

## 2019-02-14 NOTE — Transfer of Care (Signed)
Immediate Anesthesia Transfer of Care Note  Patient: Adriana Berg  Procedure(s) Performed: COLONOSCOPY WITH PROPOFOL (N/A )  Patient Location: Endoscopy Unit  Anesthesia Type:General  Level of Consciousness: drowsy, patient cooperative and responds to stimulation  Airway & Oxygen Therapy: Patient Spontanous Breathing and Patient connected to face mask oxygen  Post-op Assessment: Report given to RN and Post -op Vital signs reviewed and stable  Post vital signs: Reviewed and stable  Last Vitals:  Vitals Value Taken Time  BP 89/60 02/14/19 1203  Temp    Pulse 70 02/14/19 1203  Resp 16 02/14/19 1203  SpO2 100 % 02/14/19 1203  Vitals shown include unvalidated device data.  Last Pain:  Vitals:   02/14/19 1050  TempSrc: Temporal  PainSc: 0-No pain         Complications: No apparent anesthesia complications

## 2019-02-15 ENCOUNTER — Encounter: Payer: Self-pay | Admitting: *Deleted

## 2019-02-15 LAB — SURGICAL PATHOLOGY

## 2019-02-26 ENCOUNTER — Encounter: Payer: Self-pay | Admitting: Gastroenterology

## 2019-05-05 ENCOUNTER — Ambulatory Visit: Payer: 59 | Attending: Internal Medicine

## 2019-05-05 DIAGNOSIS — Z23 Encounter for immunization: Secondary | ICD-10-CM

## 2019-05-05 NOTE — Progress Notes (Signed)
   Covid-19 Vaccination Clinic  Name:  Adriana Berg    MRN: [id number] DOB: Jun 28, 1955  05/05/2019  [name] was observed post Covid-19 immunization for 15 minutes without incident. She was provided with Vaccine Information Sheet and instruction to access the V-Safe system.   [name] was instructed to call 911 with any severe reactions post vaccine: Marland Kitchen Difficulty breathing  . Swelling of face and throat  . A fast heartbeat  . A bad rash all over body  . Dizziness and weakness   Immunizations Administered    Name Date Dose VIS Date Route   Pfizer COVID-19 Vaccine 05/05/2019  9:53 AM 0.3 mL 03/01/2018 Intramuscular   Manufacturer: [place]   Lot: [id number]   [place]: [id number]

## 2019-05-09 ENCOUNTER — Other Ambulatory Visit: Payer: Self-pay

## 2019-05-09 DIAGNOSIS — Z1231 Encounter for screening mammogram for malignant neoplasm of breast: Secondary | ICD-10-CM

## 2019-05-30 ENCOUNTER — Ambulatory Visit: Payer: 59 | Attending: Internal Medicine

## 2019-05-30 DIAGNOSIS — Z23 Encounter for immunization: Secondary | ICD-10-CM

## 2019-05-30 NOTE — Progress Notes (Signed)
   Covid-19 Vaccination Clinic  Name:  Adriana Berg    MRN: [id number] DOB: [birth year]  05/30/2019  Adriana Berg was observed post Covid-19 immunization for 15 minutes without incident. She was provided with Vaccine Information Sheet and instruction to access the V-Safe system.   Adriana Berg was instructed to call 911 with any severe reactions post vaccine: Marland Kitchen Difficulty breathing  . Swelling of face and throat  . A fast heartbeat  . A bad rash all over body  . Dizziness and weakness   Immunizations Administered    Name Date Dose VIS Date [place] COVID-19 Vaccine 05/30/2019  9:50 AM 0.3 mL 03/01/2018 Intramuscular   Manufacturer: [employer], [employer]   Lot: [id number]   [place]: [id number]

## 2019-06-28 ENCOUNTER — Ambulatory Visit
Admission: RE | Admit: 2019-06-28 | Discharge: 2019-06-28 | Disposition: A | Discharge status: Home / Self Care | Payer: 59 | Source: Ambulatory Visit | Attending: Surgery | Admitting: Surgery

## 2019-06-28 DIAGNOSIS — Z1231 Encounter for screening mammogram for malignant neoplasm of breast: Secondary | ICD-10-CM | POA: Insufficient documentation

## 2019-07-05 ENCOUNTER — Ambulatory Visit: Payer: 59 | Admitting: Surgery

## 2019-07-12 ENCOUNTER — Encounter: Payer: Self-pay | Admitting: Surgery

## 2019-07-12 ENCOUNTER — Other Ambulatory Visit: Payer: Self-pay

## 2019-07-12 ENCOUNTER — Ambulatory Visit (INDEPENDENT_AMBULATORY_CARE_PROVIDER_SITE_OTHER): Payer: BC Managed Care – PPO | Admitting: Surgery

## 2019-07-12 VITALS — BP 127/76 | HR 64 | Temp 96.4°F | Ht 62.0 in | Wt 152.6 lb

## 2019-07-12 DIAGNOSIS — Z853 Personal history of malignant neoplasm of breast: Secondary | ICD-10-CM

## 2019-07-12 NOTE — Patient Instructions (Addendum)
Dr.Piscoya advised patient she may return to have Mammograms with PCP.   Follow-up with our office as needed.  Please call and ask to speak with a nurse if you develop questions or concerns.   Breast Self-Awareness Breast self-awareness is knowing how your breasts look and feel. Doing breast self-awareness is important. It allows you to catch a breast problem early while it is still small and can be treated. All women should do breast self-awareness, including women who have had breast implants. Tell your doctor if you notice a change in your breasts. What you need:  A mirror.  A well-lit room. How to do a breast self-exam A breast self-exam is one way to learn what is normal for your breasts and to check for changes. To do a breast self-exam: Look for changes  1. Take off all the clothes above your waist. 2. Stand in front of a mirror in a room with good lighting. 3. Put your hands on your hips. 4. Push your hands down. 5. Look at your breasts and nipples in the mirror to see if one breast or nipple looks different from the other. Check to see if: ? The shape of one breast is different. ? The size of one breast is different. ? There are wrinkles, dips, and bumps in one breast and not the other. 6. Look at each breast for changes in the skin, such as: ? Redness. ? Scaly areas. 7. Look for changes in your nipples, such as: ? Liquid around the nipples. ? Bleeding. ? Dimpling. ? Redness. ? A change in where the nipples are. Feel for changes  1. Lie on your back on the floor. 2. Feel each breast. To do this, follow these steps: ? Pick a breast to feel. ? Put the arm closest to that breast above your head. ? Use your other arm to feel the nipple area of your breast. Feel the area with the pads of your three middle fingers by making small circles with your fingers. For the first circle, press lightly. For the second circle, press harder. For the third circle, press even  harder. ? Keep making circles with your fingers at the different pressures as you move down your breast. Stop when you feel your ribs. ? Move your fingers a little toward the center of your body. ? Start making circles with your fingers again, this time going up until you reach your collarbone. ? Keep making up-and-down circles until you reach your armpit. Remember to keep using the three pressures. ? Feel the other breast in the same way. 3. Sit or stand in the tub or shower. 4. With soapy water on your skin, feel each breast the same way you did in step 2 when you were lying on the floor. Write down what you find Writing down what you find can help you remember what to tell your doctor. Write down:  What is normal for each breast.  Any changes you find in each breast, including: ? The kind of changes you find. ? Whether you have pain. ? Size and location of any lumps.  When you last had your menstrual period. General tips  Check your breasts every month.  If you are breastfeeding, the best time to check your breasts is after you feed your baby or after you use a breast pump.  If you get menstrual periods, the best time to check your breasts is 5-7 days after your menstrual period is over.  With time, you  will become comfortable with the self-exam, and you will begin to know if there are changes in your breasts. Contact a doctor if you:  See a change in the shape or size of your breasts or nipples.  See a change in the skin of your breast or nipples, such as red or scaly skin.  Have fluid coming from your nipples that is not normal.  Find a lump or thick area that was not there before.  Have pain in your breasts.  Have any concerns about your breast health. Summary  Breast self-awareness includes looking for changes in your breasts, as well as feeling for changes within your breasts.  Breast self-awareness should be done in front of a mirror in a well-lit room.  You  should check your breasts every month. If you get menstrual periods, the best time to check your breasts is 5-7 days after your menstrual period is over.  Let your doctor know of any changes you see in your breasts, including changes in size, changes on the skin, pain or tenderness, or fluid from your nipples that is not normal. This information is not intended to replace advice given to you by your health care provider. Make sure you discuss any questions you have with your health care provider. Document Revised: 08/10/2017 Document Reviewed: 08/10/2017 Elsevier Patient Education  [place].

## 2019-07-12 NOTE — Progress Notes (Signed)
07/12/2019  History of Present Illness: Adriana Berg is a 64 y.o. female s/p right breast lumpectomy and SLNBx for breast cancer in 2007 with Dr. Jamal Collin.  She presents today for follow up.  She had a mammogram on 06/28/19 which was negative for any suspicious findings.  She reports she's been doing well, without any palpable masses, skin changes, or nipple changes.  She reports persistent soreness at the lumpectomy site that's been stable since surgery.  Past Medical History: Past Medical History:  Diagnosis Date  . Borderline diabetes   . Breast cancer ([place]) 2007   RT LUMPECTOMY  . Cancer ([place])   . Lab test positive for detection of COVID-19 virus 12/2018  . Personal history of malignant neoplasm of breast    invasive ductal carcinoma with tubular features,Stage 1,ER pos,HER2 neg  . Personal history of radiation therapy 2007  . Personal history of tobacco use, presenting hazards to health   . Pneumonia Feb 2017  . Radiation 2007   BREAST CA  . Unspecified essential hypertension      Past Surgical History: Past Surgical History:  Procedure Laterality Date  . ABDOMINAL HYSTERECTOMY  1995  . AXILLARY SENTINEL NODE BIOPSY Right 2007  . BREAST BIOPSY Right 2007   CORE - POS  . BREAST BIOPSY Right 1995   EXCISIONAL - NEG  . BREAST LUMPECTOMY Right 2007   breast ca  . BREAST SURGERY  2007   right lumpectomy  . COLONOSCOPY  12/27/2012  . COLONOSCOPY WITH PROPOFOL N/A 02/14/2019   Procedure: COLONOSCOPY WITH PROPOFOL;  Surgeon: Jonathon Bellows, MD;  Location: [facility] ENDOSCOPY;  Service: Gastroenterology;  Laterality: N/A;    Home Medications: Prior to Admission medications   Medication Sig Start Date End Date Taking? Authorizing Provider  amLODipine (NORVASC) 2.5 MG tablet Take 2.5 mg by mouth daily.   Yes [provider]  Cholecalciferol (VITAMIN D3 PO) Take 50,000 Units by mouth daily.   Yes [provider]  CVS ASPIRIN ADULT LOW STRENGTH 81 MG EC tablet Take 81 mg  by mouth daily. 07/07/19  Yes [provider]  loratadine (CLARITIN) 10 MG tablet Take 10 mg by mouth daily. 05/15/19  Yes [provider]  quinapril (ACCUPRIL) 40 MG tablet Take 40 mg by mouth at bedtime.   Yes [provider]  rosuvastatin (CRESTOR) 20 MG tablet Take 20 mg by mouth at bedtime. 04/27/19  Yes [provider]  spironolactone (ALDACTONE) 50 MG tablet Take 50 mg by mouth daily.  06/17/17  Yes [provider]  VITAMIN D PO Take 1,000 Units by mouth.   Yes [provider]  Vitamin D, Ergocalciferol, (DRISDOL) 50000 units CAPS capsule Take 50,000 Units by mouth every 7 (seven) days. 06/01/17  Yes [provider]  albuterol (PROVENTIL) (2.5 MG/3ML) 0.083% nebulizer solution Take 3 mLs (2.5 mg total) by nebulization every 6 (six) hours as needed for wheezing or shortness of breath. 01/19/17 12/18/18  Laban Emperor, PA-C    Allergies: Allergies  Allergen Reactions  . Azithromycin Swelling    Review of Systems: Review of Systems  Constitutional: Negative for chills and fever.  Respiratory: Negative for shortness of breath.   Cardiovascular: Negative for chest pain.  Gastrointestinal: Negative for abdominal pain, nausea and vomiting.  Skin: Negative for rash.    Physical Exam BP 127/76   Pulse 64   Temp (!) 96.4 F (35.8 C) (Temporal)   Ht '5\' 2"'  (1.575 m)   Wt 152 lb 9.6 oz (69.2  kg)   SpO2 97%   BMI 27.91 kg/m  CONSTITUTIONAL: No acute distress HEENT:  Normocephalic, atraumatic, extraocular motion intact. RESPIRATORY:  Lungs are clear, and breath sounds are equal bilaterally. Normal respiratory effort without pathologic use of accessory muscles. CARDIOVASCULAR: Heart is regular without murmurs, gallops, or rubs. BREAST:  Right breast s/p lumpectomy in upper outer quadrant with scar well healed as well as a stable right upper outer periareolar scar.  There are no other skin changes, palpable masses, or nipple  changes.  No right axillary or supraclavicular lymphadenopathy.  Left breast without any palpable masses, skin changes, or nipple changes.  No left axillary or supraclavicular lymphadenopathy. NEUROLOGIC:  Motor and sensation is grossly normal.  Cranial nerves are grossly intact. PSYCH:  Alert and oriented to person, place and time. Affect is normal.  Labs/Imaging: Mammogram 06/28/19 FINDINGS: There are no findings suspicious for malignancy. Images were processed with CAD.  IMPRESSION: No mammographic evidence of malignancy. A result letter of this screening mammogram will be mailed directly to the patient.  RECOMMENDATION: Screening mammogram in one year. (Code:SM-B-01Y)  BI-RADS CATEGORY  1: Negative.  Assessment and Plan: This is a 64 y.o. female s/p right breast lumpectomy and SLNBx in 2007.  --Discussed with the patient the mammogram results.  Her exam is also reassuring. --Given all the negative mammograms over the past years, I think it's appropriate to defer further mammograms and breast exams to her PCP at this point.  She understands that if any issues arise, we'd be readily available to help in any way needed. --Follow up prn.  Face-to-face time spent with the patient and care providers was 15 minutes, with more than 50% of the time spent counseling, educating, and coordinating care of the patient.     Melvyn Neth, [facility]

## 2019-12-19 ENCOUNTER — Other Ambulatory Visit: Payer: Self-pay | Admitting: *Deleted

## 2019-12-19 DIAGNOSIS — Z122 Encounter for screening for malignant neoplasm of respiratory organs: Secondary | ICD-10-CM

## 2019-12-19 DIAGNOSIS — Z87891 Personal history of nicotine dependence: Secondary | ICD-10-CM

## 2019-12-19 NOTE — Progress Notes (Signed)
Contacted and scheduled for annual lung screening scan. Patient is a current smoker with a 31.5 pack year history. She has new insurance with [employer] # [id number]

## 2019-12-28 ENCOUNTER — Ambulatory Visit: Admission: RE | Admit: 2019-12-28 | Payer: BC Managed Care – PPO | Source: Ambulatory Visit

## 2020-01-08 ENCOUNTER — Ambulatory Visit: Admission: RE | Admit: 2020-01-08 | Payer: BC Managed Care – PPO | Source: Ambulatory Visit

## 2020-02-15 ENCOUNTER — Telehealth: Payer: Self-pay

## 2020-02-15 NOTE — Telephone Encounter (Signed)
Contacted patient to reschedule annual lung cancer screening scan. She has been rescheduled for 03/07/2020 @ 8 am.

## 2020-03-07 ENCOUNTER — Ambulatory Visit
Admission: RE | Admit: 2020-03-07 | Discharge: 2020-03-07 | Disposition: A | Discharge status: Home / Self Care | Payer: BC Managed Care – PPO | Source: Ambulatory Visit | Attending: Oncology | Admitting: Oncology

## 2020-03-07 ENCOUNTER — Other Ambulatory Visit: Payer: Self-pay

## 2020-03-07 DIAGNOSIS — Z122 Encounter for screening for malignant neoplasm of respiratory organs: Secondary | ICD-10-CM | POA: Diagnosis present

## 2020-03-07 DIAGNOSIS — Z87891 Personal history of nicotine dependence: Secondary | ICD-10-CM | POA: Insufficient documentation

## 2020-03-14 ENCOUNTER — Encounter: Payer: Self-pay | Admitting: *Deleted

## 2020-03-18 ENCOUNTER — Other Ambulatory Visit: Payer: Self-pay | Admitting: Family Medicine

## 2020-03-18 DIAGNOSIS — Z13820 Encounter for screening for osteoporosis: Secondary | ICD-10-CM

## 2020-05-29 ENCOUNTER — Other Ambulatory Visit: Payer: Self-pay | Admitting: Family Medicine

## 2020-05-29 DIAGNOSIS — Z1231 Encounter for screening mammogram for malignant neoplasm of breast: Secondary | ICD-10-CM

## 2020-07-01 ENCOUNTER — Other Ambulatory Visit: Payer: Self-pay

## 2020-07-01 ENCOUNTER — Ambulatory Visit
Admission: RE | Admit: 2020-07-01 | Discharge: 2020-07-01 | Disposition: A | Discharge status: Home / Self Care | Payer: BC Managed Care – PPO | Source: Ambulatory Visit | Attending: Family Medicine | Admitting: Family Medicine

## 2020-07-01 DIAGNOSIS — Z1231 Encounter for screening mammogram for malignant neoplasm of breast: Secondary | ICD-10-CM | POA: Insufficient documentation

## 2020-10-30 ENCOUNTER — Ambulatory Visit: Payer: BC Managed Care – PPO | Admitting: Surgery

## 2020-10-30 ENCOUNTER — Other Ambulatory Visit: Payer: Self-pay

## 2020-10-30 ENCOUNTER — Encounter: Payer: Self-pay | Admitting: Surgery

## 2020-10-30 VITALS — BP 138/80 | HR 67 | Temp 98.3°F | Ht 62.0 in | Wt 151.0 lb

## 2020-10-30 DIAGNOSIS — Z853 Personal history of malignant neoplasm of breast: Secondary | ICD-10-CM | POA: Diagnosis not present

## 2020-10-30 DIAGNOSIS — N611 Abscess of the breast and nipple: Secondary | ICD-10-CM

## 2020-10-30 MED ORDER — SULFAMETHOXAZOLE-TRIMETHOPRIM 800-160 MG PO TABS: 1.0000 | ORAL_TABLET | Freq: Two times a day (BID) | ORAL | 0 refills | Status: DC

## 2020-10-30 NOTE — Progress Notes (Signed)
10/30/2020  History of Present Illness: Adriana Berg is a 65 y.o. female with a history of right breast cancer status post right breast lumpectomy and sentinel lymph node biopsy in 2007 with Dr. Jamal Collin.  She was last seen on 07/12/2019 for her last follow-up.  Her last mammogram was arranged through her PCP which was done on 07/01/2020 which was overall negative for any new or suspicious findings.  Patient reports that over the last week or 2 she has had an area of tenderness in the right breast above the nipple area.  This area started like what looked like a blackhead and grew in size and became more tender with redness.  More recently the pain has improved but no drainage was ever noted.  Denies any fevers, chills, chest pain, shortness of breath.  She has not tried any warm compresses.  Past Medical History: Past Medical History:  Diagnosis Date   Borderline diabetes    Breast cancer ([place]) 2007   RT LUMPECTOMY   Cancer ([place])    Lab test positive for detection of COVID-19 virus 12/2018   Personal history of malignant neoplasm of breast    invasive ductal carcinoma with tubular features,Stage 1,ER pos,HER2 neg   Personal history of radiation therapy 2007   Personal history of tobacco use, presenting hazards to health    Pneumonia Feb 2017   Radiation 2007   BREAST CA   Unspecified essential hypertension      Past Surgical History: Past Surgical History:  Procedure Laterality Date   ABDOMINAL HYSTERECTOMY  1995   AXILLARY SENTINEL NODE BIOPSY Right 2007   BREAST BIOPSY Right 2007   CORE - POS   BREAST BIOPSY Right 1995   EXCISIONAL - NEG   BREAST LUMPECTOMY Right 2007   breast ca   BREAST SURGERY  2007   right lumpectomy   COLONOSCOPY  12/27/2012   COLONOSCOPY WITH PROPOFOL N/A 02/14/2019   Procedure: COLONOSCOPY WITH PROPOFOL;  Surgeon: Jonathon Bellows, MD;  Location: [facility] ENDOSCOPY;  Service: Gastroenterology;  Laterality: N/A;    Home Medications: Prior to Admission  medications   Medication Sig Start Date End Date Taking? Authorizing Provider  amLODipine (NORVASC) 2.5 MG tablet Take 2.5 mg by mouth daily.   Yes [provider]  Cholecalciferol (VITAMIN D3 PO) Take 50,000 Units by mouth daily.   Yes [provider]  CVS ASPIRIN ADULT LOW STRENGTH 81 MG EC tablet Take 81 mg by mouth daily. 07/07/19  Yes [provider]  loratadine (CLARITIN) 10 MG tablet Take 10 mg by mouth daily. 05/15/19  Yes [provider]  quinapril (ACCUPRIL) 40 MG tablet Take 40 mg by mouth at bedtime.   Yes [provider]  rosuvastatin (CRESTOR) 20 MG tablet Take 20 mg by mouth at bedtime. 04/27/19  Yes [provider]  spironolactone (ALDACTONE) 50 MG tablet Take 50 mg by mouth daily.  06/17/17  Yes [provider]  sulfamethoxazole-trimethoprim (BACTRIM DS) 800-160 MG tablet Take 1 tablet by mouth 2 (two) times daily. 10/30/20  Yes Jemell Town, Jacqulyn Bath, MD  VITAMIN D PO Take 1,000 Units by mouth.   Yes [provider]  Vitamin D, Ergocalciferol, (DRISDOL) 50000 units CAPS capsule Take 50,000 Units by mouth every 7 (seven) days. 06/01/17  Yes [provider]  albuterol (PROVENTIL) (2.5 MG/3ML) 0.083% nebulizer solution Take 3 mLs (2.5 mg total) by nebulization every 6 (six) hours as needed for wheezing or shortness of breath. 01/19/17 12/18/18  Laban Emperor, PA-C  Allergies: Allergies  Allergen Reactions   Azithromycin Swelling    Review of Systems: Review of Systems  Constitutional:  Negative for chills and fever.  Respiratory:  Negative for shortness of breath.   Cardiovascular:  Negative for chest pain.  Gastrointestinal:  Negative for abdominal pain, nausea and vomiting.  Skin:        Right breast tenderness and redness   Physical Exam BP 138/80   Pulse 67   Temp 98.3 F (36.8 C)   Ht _0  (1.575 m)   Wt 151 lb (68.5 kg)   SpO2 97%   BMI 27.62 kg/m  CONSTITUTIONAL: No acute  distress HEENT:  Normocephalic, atraumatic, extraocular motion intact. RESPIRATORY:  Normal respiratory effort without pathologic use of accessory muscles. CARDIOVASCULAR: Regular rhythm and rate. BREAST: Right breast status post upper outer quadrant lumpectomy.  Scars well-healed.  The patient does have an approximately 1.5 x 2 cm area at approximately 11:00 just outside the areola which is firm and flat with mild surrounding erythema.  No fluctuance is noticeable or palpable. NEUROLOGIC:  Motor and sensation is grossly normal.  Cranial nerves are grossly intact. PSYCH:  Alert and oriented to person, place and time. Affect is normal.  Labs/Imaging: Mammogram on 07/01/2020: FINDINGS: There are no findings suspicious for malignancy.   IMPRESSION: No mammographic evidence of malignancy. A result letter of this screening mammogram will be mailed directly to the patient.   RECOMMENDATION: Screening mammogram in one year. (Code:SM-B-01Y)   BI-RADS CATEGORY  1: Negative.  Assessment and Plan: This is a 65 y.o. female with likely a right breast abscess.  - Discussed with the patient that I think she probably had a right breast abscess with surrounding erythema.  Currently there is no fluctuance but some induration.  I do not think this is related to her prior history of breast cancer and her most recent mammogram just a few months ago was negative.  This may have started as a small cut on the skin versus a small cyst that got infected.  At this point, we will start the patient with a 7-day course of Bactrim.  Recommended that she also do warm compresses to the area to promote any drainage.  She will follow-up with me in 2 weeks to reassess.  However return precautions given particularly if the area is getting worse or purulent drainage starts occurring as she may need an I&D procedure at that point.  I spent 25 minutes dedicated to the care of this patient on the date of this encounter to include  pre-visit review of records, face-to-face time with the patient discussing diagnosis and management, and any post-visit coordination of care.   Melvyn Neth, [facility]

## 2020-10-30 NOTE — Patient Instructions (Addendum)
We will send you in a prescription for antibiotics to clear up any skin infection. Take this medication with food. Do warm compresses to the area 2-3 times a day.   Call us if the area gets worse, you develop drainage, or you develop a fever over 100.   Follow up here in 2 weeks.  Please call and ask to speak with a nurse if you develop questions or concerns.

## 2020-11-15 ENCOUNTER — Other Ambulatory Visit: Payer: Self-pay

## 2020-11-15 ENCOUNTER — Ambulatory Visit: Payer: Self-pay

## 2020-11-15 ENCOUNTER — Encounter: Payer: Self-pay | Admitting: Surgery

## 2020-11-15 ENCOUNTER — Ambulatory Visit: Payer: BC Managed Care – PPO | Admitting: Surgery

## 2020-11-15 VITALS — BP 147/83 | HR 80 | Temp 98.5°F | Ht 62.0 in | Wt 154.0 lb

## 2020-11-15 DIAGNOSIS — N611 Abscess of the breast and nipple: Secondary | ICD-10-CM | POA: Diagnosis not present

## 2020-11-15 NOTE — Progress Notes (Signed)
11/15/2020  History of Present Illness: Adriana Berg is a 65 y.o. female presenting for follow-up from her right breast abscess.  She was last seen on 10/30/2020 with a new area of tenderness in the upper outer quadrant periareolar area which was felt to be an abscess.  She was started on a course of Bactrim.  She reports that this improves the pain and swelling around the area but symptoms have not fully resolved yet.  She finished her course last week and has not noticed any worsening with her antibiotic. Reports now some itchiness around the area.  Of note, she had her screening mammogram on 07/01/2020 did not show any suspicious findings.  Past Medical History: Past Medical History:  Diagnosis Date   Borderline diabetes    Breast cancer ([place]) 2007   RT LUMPECTOMY   Cancer ([place])    Lab test positive for detection of COVID-19 virus 12/2018   Personal history of malignant neoplasm of breast    invasive ductal carcinoma with tubular features,Stage 1,ER pos,HER2 neg   Personal history of radiation therapy 2007   Personal history of tobacco use, presenting hazards to health    Pneumonia Feb 2017   Radiation 2007   BREAST CA   Unspecified essential hypertension      Past Surgical History: Past Surgical History:  Procedure Laterality Date   ABDOMINAL HYSTERECTOMY  1995   AXILLARY SENTINEL NODE BIOPSY Right 2007   BREAST BIOPSY Right 2007   CORE - POS   BREAST BIOPSY Right 1995   EXCISIONAL - NEG   BREAST LUMPECTOMY Right 2007   breast ca   BREAST SURGERY  2007   right lumpectomy   COLONOSCOPY  12/27/2012   COLONOSCOPY WITH PROPOFOL N/A 02/14/2019   Procedure: COLONOSCOPY WITH PROPOFOL;  Surgeon: Jonathon Bellows, MD;  Location: [facility] ENDOSCOPY;  Service: Gastroenterology;  Laterality: N/A;    Home Medications: Prior to Admission medications   Medication Sig Start Date End Date Taking? Authorizing Provider  amLODipine (NORVASC) 2.5 MG tablet Take 2.5 mg by mouth daily.   Yes  [provider]  Cholecalciferol (VITAMIN D3 PO) Take 50,000 Units by mouth daily.   Yes [provider]  CVS ASPIRIN ADULT LOW STRENGTH 81 MG EC tablet Take 81 mg by mouth daily. 07/07/19  Yes [provider]  loratadine (CLARITIN) 10 MG tablet Take 10 mg by mouth daily. 05/15/19  Yes [provider]  quinapril (ACCUPRIL) 40 MG tablet Take 40 mg by mouth at bedtime.   Yes [provider]  rosuvastatin (CRESTOR) 20 MG tablet Take 20 mg by mouth at bedtime. 04/27/19  Yes [provider]  spironolactone (ALDACTONE) 50 MG tablet Take 50 mg by mouth daily.  06/17/17  Yes [provider]  VITAMIN D PO Take 1,000 Units by mouth.   Yes [provider]  Vitamin D, Ergocalciferol, (DRISDOL) 50000 units CAPS capsule Take 50,000 Units by mouth every 7 (seven) days. 06/01/17  Yes [provider]  albuterol (PROVENTIL) (2.5 MG/3ML) 0.083% nebulizer solution Take 3 mLs (2.5 mg total) by nebulization every 6 (six) hours as needed for wheezing or shortness of breath. 01/19/17 12/18/18  Laban Emperor, PA-C    Allergies: Allergies  Allergen Reactions   Azithromycin Swelling    Review of Systems: Review of Systems  Constitutional:  Negative for chills and fever.  Respiratory:  Negative for shortness of breath.   Cardiovascular:  Negative for chest pain.  Gastrointestinal:  Negative for nausea and vomiting.  Skin:  Positive for itching.   Physical Exam BP (!) 147/83   Pulse 80   Temp 98.5 F (36.9 C) (Oral)   Ht _0  (1.575 m)   Wt 154 lb (69.9 kg)   SpO2 93%   BMI 28.17 kg/m  CONSTITUTIONAL: No acute distress, well-nourished HEENT:  Normocephalic, atraumatic, extraocular motion intact. RESPIRATORY:  Normal respiratory effort without pathologic use of accessory muscles. CARDIOVASCULAR: Regular rhythm and rate. BREAST: Right breast with an area of firmness with erythema in the upper outer quadrant in the periareolar  region.  This is not significantly tender to palpation and there is only some mild to minimal fluctuance at the surface. NEUROLOGIC:  Motor and sensation is grossly normal.  Cranial nerves are grossly intact. PSYCH:  Alert and oriented to person, place and time. Affect is normal.   Assessment and Plan: This is a 65 y.o. female with a possible right breast abscess status post antibiotic course.  - Discussed with the patient that since there is still erythema and firmness in this area, we should obtain a right breast ultrasound to evaluate for any potential residual abscess that could be aspirated.  Discussed with her that this could be aspirated if there is an abscess versus biopsy of this were to be a mass.  For now no antibiotic is needed as she has not had any worsening issues without it for the last week. - I will call patient with the results and for any further follow-up.  I spent 15 minutes dedicated to the care of this patient on the date of this encounter to include pre-visit review of records, face-to-face time with the patient discussing diagnosis and management, and any post-visit coordination of care.   Melvyn Neth, [facility]

## 2020-11-15 NOTE — Patient Instructions (Addendum)
Right Breast Ultrasound scheduled 11/25/20 @ 10:am @ [facility].

## 2020-11-25 ENCOUNTER — Ambulatory Visit
Admission: RE | Admit: 2020-11-25 | Discharge: 2020-11-25 | Disposition: A | Discharge status: Home / Self Care | Payer: BC Managed Care – PPO | Source: Ambulatory Visit | Attending: Surgery | Admitting: Surgery

## 2020-11-25 ENCOUNTER — Other Ambulatory Visit: Payer: Self-pay

## 2020-11-25 DIAGNOSIS — N611 Abscess of the breast and nipple: Secondary | ICD-10-CM

## 2020-11-27 ENCOUNTER — Encounter: Payer: Self-pay | Admitting: Surgery

## 2020-11-27 ENCOUNTER — Other Ambulatory Visit: Payer: Self-pay

## 2020-11-27 ENCOUNTER — Ambulatory Visit: Payer: BC Managed Care – PPO | Admitting: Surgery

## 2020-11-27 VITALS — BP 120/68 | HR 76 | Temp 97.8°F | Ht 62.0 in | Wt 153.2 lb

## 2020-11-27 DIAGNOSIS — N611 Abscess of the breast and nipple: Secondary | ICD-10-CM

## 2020-11-27 NOTE — Progress Notes (Signed)
11/27/2020  History of Present Illness: Adriana Berg is a 65 y.o. female presenting for follow up of a right breast abscess.  She had a mammogram and ultrasound on 11/25/20 as a precaution to make sure there was no residual abscess.  On imaging, there is no abscess in the right breast, particularly no abscess at the skin or under in the upper outer quadrant near the nipple where the area of discomfort has been.  Also, they showed stable right breast calcifications.  Patient denies any worsening pain, drainage, or fluctuance.  Past Medical History: Past Medical History:  Diagnosis Date   Borderline diabetes    Breast cancer ([place]) 2007   RT LUMPECTOMY   Cancer ([place])    Lab test positive for detection of COVID-19 virus 12/2018   Personal history of malignant neoplasm of breast    invasive ductal carcinoma with tubular features,Stage 1,ER pos,HER2 neg   Personal history of radiation therapy 2007   Personal history of tobacco use, presenting hazards to health    Pneumonia Feb 2017   Radiation 2007   BREAST CA   Unspecified essential hypertension      Past Surgical History: Past Surgical History:  Procedure Laterality Date   ABDOMINAL HYSTERECTOMY  1995   AXILLARY SENTINEL NODE BIOPSY Right 2007   BREAST BIOPSY Right 2007   CORE - POS   BREAST BIOPSY Right 1995   EXCISIONAL - NEG   BREAST LUMPECTOMY Right 2007   breast ca   BREAST SURGERY  2007   right lumpectomy   COLONOSCOPY  12/27/2012   COLONOSCOPY WITH PROPOFOL N/A 02/14/2019   Procedure: COLONOSCOPY WITH PROPOFOL;  Surgeon: Jonathon Bellows, MD;  Location: [facility] ENDOSCOPY;  Service: Gastroenterology;  Laterality: N/A;    Home Medications: Prior to Admission medications   Medication Sig Start Date End Date Taking? Authorizing Provider  amLODipine (NORVASC) 2.5 MG tablet Take 2.5 mg by mouth daily.   Yes [provider]  Cholecalciferol (VITAMIN D3 PO) Take 50,000 Units by mouth daily.   Yes [provider]   CVS ASPIRIN ADULT LOW STRENGTH 81 MG EC tablet Take 81 mg by mouth daily. 07/07/19  Yes [provider]  loratadine (CLARITIN) 10 MG tablet Take 10 mg by mouth daily. 05/15/19  Yes [provider]  quinapril (ACCUPRIL) 40 MG tablet Take 40 mg by mouth at bedtime.   Yes [provider]  rosuvastatin (CRESTOR) 20 MG tablet Take 20 mg by mouth at bedtime. 04/27/19  Yes [provider]  spironolactone (ALDACTONE) 50 MG tablet Take 50 mg by mouth daily.  06/17/17  Yes [provider]  VITAMIN D PO Take 1,000 Units by mouth.   Yes [provider]  Vitamin D, Ergocalciferol, (DRISDOL) 50000 units CAPS capsule Take 50,000 Units by mouth every 7 (seven) days. 06/01/17  Yes [provider]  albuterol (PROVENTIL) (2.5 MG/3ML) 0.083% nebulizer solution Take 3 mLs (2.5 mg total) by nebulization every 6 (six) hours as needed for wheezing or shortness of breath. 01/19/17 12/18/18  Laban Emperor, PA-C    Allergies: Allergies  Allergen Reactions   Azithromycin Swelling    Review of Systems: Review of Systems  Constitutional:  Negative for chills and fever.  Respiratory:  Negative for shortness of breath.   Cardiovascular:  Negative for chest pain.  Gastrointestinal:  Negative for nausea and vomiting.  Skin:  Negative for rash.   Physical Exam BP 120/68   Pulse 76   Temp 97.8 F (36.6 C) (  Oral)   Ht '5\' 2"'  (1.575 m)   Wt 153 lb 3.2 oz (69.5 kg)   SpO2 99%   BMI 28.02 kg/m  CONSTITUTIONAL: No acute distress, well nourished. HEENT:  Normocephalic, atraumatic, extraocular motion intact. RESPIRATORY:  Normal respiratory effort without pathologic use of accessory muscles. CARDIOVASCULAR: Regular rhythm and rate. BREAST:  Right breast with improving changes in the upper outer quadrant.  There is still a bit of skin peeling, but no fluctuance and no erythema/tenderness.  The area of calcification at 10 o'clock position is stable. NEUROLOGIC:   Motor and sensation is grossly normal.  Cranial nerves are grossly intact. PSYCH:  Alert and oriented to person, place and time. Affect is normal.  Labs/Imaging: Mammogram and U/S right breast 11/25/20: IMPRESSION: 1. No abscess collection is identified within the RIGHT breast. 2. Focal mild skin thickening within the upper-outer quadrant of the RIGHT breast, at and surrounding the site of patient's visible skin lesion/peeling that is located at the 10 o'clock axis of the RIGHT breast, 3 cm from the nipple. No fluid collection within the skin. No abscess collection within the underlying breast tissues. Patient states that clinical findings have improved after course of antibiotics. 3. Stable postsurgical changes within the RIGHT breast.  Assessment and Plan: This is a 65 y.o. female with prior right breast abscess  --The patient's condition has improved with no further tenderness.  Imaging studies did not show any residual abscess.  No I&D or abx are needed at this point.  The area of firmness that is palpable is the calcifications that are seen on mammogram.   --Discussed with patient that if there's no improvement in the next few months, to let us know so we can do a follow up ultrasound.  Otherwise she'll be due for screening mammogram with her PCP in June 2023. --Follow up as needed.  I spent 15 minutes dedicated to the care of this patient on the date of this encounter to include pre-visit review of records, face-to-face time with the patient discussing diagnosis and management, and any post-visit coordination of care.   Melvyn Neth, [facility]

## 2020-11-27 NOTE — Patient Instructions (Addendum)
Continue with your mammograms with your Oncology team or your primary care provider. You are due in June next year for your annual mammogram.  If by February this area has not improved significantly call and let us know and we will schedule an ultrasound of the breast and a follow up with Dr Hampton Abbot.   Try using moisturizing lotion to the area.   Follow-up with our office as needed.  Please call and ask to speak with a nurse if you develop questions or concerns.

## 2020-12-16 ENCOUNTER — Ambulatory Visit (INDEPENDENT_AMBULATORY_CARE_PROVIDER_SITE_OTHER): Payer: BC Managed Care – PPO | Admitting: Surgery

## 2020-12-16 ENCOUNTER — Encounter: Payer: Self-pay | Admitting: Surgery

## 2020-12-16 ENCOUNTER — Other Ambulatory Visit: Payer: Self-pay

## 2020-12-16 VITALS — BP 150/63 | HR 89 | Temp 98.1°F | Ht 62.0 in | Wt 155.0 lb

## 2020-12-16 DIAGNOSIS — N611 Abscess of the breast and nipple: Secondary | ICD-10-CM

## 2020-12-16 MED ORDER — SULFAMETHOXAZOLE-TRIMETHOPRIM 800-160 MG PO TABS: 1.0000 | ORAL_TABLET | Freq: Two times a day (BID) | ORAL | 0 refills | Status: AC

## 2020-12-16 NOTE — Patient Instructions (Addendum)
We will start you on a new course of antibiotics.   We will have you follow up here on December 23rd.

## 2020-12-16 NOTE — Progress Notes (Signed)
12/16/2020  History of Present Illness: Adriana Berg is a 65 y.o. female presenting for evaluation of a right breast abscess.  She was initially seen for this on 10/30/20.  She was started on Bactrim which helped, but the patient still felt some pain on follow up appointment on 11/15/20.  She had a mammogram and ultrasound of the right breast on 11/25/20 and there was no evidence of abscess and only a large benign dystrophic/fat necrosis calcification just beneath the skin.  I discussed the results with the patient on follow up visit on 11/23 and at that point, she pain had resolved and the area continued to improve.  However, the patient presents today because she started having more pain again and with again drainage from the area starting 2 days ago.  Today, the drainage has been much decreased, but there's still tenderness.  She reports the drainage is coming from the same area as before.  Denies any fevers, chills, chest pain, shortness of breath.  Past Medical History: Past Medical History:  Diagnosis Date   Borderline diabetes    Breast cancer ([place]) 2007   RT LUMPECTOMY   Cancer ([place])    Lab test positive for detection of COVID-19 virus 12/2018   Personal history of malignant neoplasm of breast    invasive ductal carcinoma with tubular features,Stage 1,ER pos,HER2 neg   Personal history of radiation therapy 2007   Personal history of tobacco use, presenting hazards to health    Pneumonia Feb 2017   Radiation 2007   BREAST CA   Unspecified essential hypertension      Past Surgical History: Past Surgical History:  Procedure Laterality Date   ABDOMINAL HYSTERECTOMY  1995   AXILLARY SENTINEL NODE BIOPSY Right 2007   BREAST BIOPSY Right 2007   CORE - POS   BREAST BIOPSY Right 1995   EXCISIONAL - NEG   BREAST LUMPECTOMY Right 2007   breast ca   BREAST SURGERY  2007   right lumpectomy   COLONOSCOPY  12/27/2012   COLONOSCOPY WITH PROPOFOL N/A 02/14/2019   Procedure:  COLONOSCOPY WITH PROPOFOL;  Surgeon: Jonathon Bellows, MD;  Location: [facility] ENDOSCOPY;  Service: Gastroenterology;  Laterality: N/A;    Home Medications: Prior to Admission medications   Medication Sig Start Date End Date Taking? Authorizing Provider  amLODipine (NORVASC) 2.5 MG tablet Take 2.5 mg by mouth daily.   Yes [provider]  Cholecalciferol (VITAMIN D3 PO) Take 50,000 Units by mouth daily.   Yes [provider]  [facility] ASPIRIN ADULT LOW STRENGTH 81 MG EC tablet Take 81 mg by mouth daily. 07/07/19  Yes [provider]  loratadine (CLARITIN) 10 MG tablet Take 10 mg by mouth daily. 05/15/19  Yes [provider]  quinapril (ACCUPRIL) 40 MG tablet Take 40 mg by mouth at bedtime.   Yes [provider]  rosuvastatin (CRESTOR) 20 MG tablet Take 20 mg by mouth at bedtime. 04/27/19  Yes [provider]  spironolactone (ALDACTONE) 50 MG tablet Take 50 mg by mouth daily.  06/17/17  Yes [provider]  sulfamethoxazole-trimethoprim (BACTRIM DS) 800-160 MG tablet Take 1 tablet by mouth 2 (two) times daily for 10 days. 12/16/20 12/26/20 Yes Berklee Battey, Jacqulyn Bath, MD  VITAMIN D PO Take 1,000 Units by mouth.   Yes [provider]  Vitamin D, Ergocalciferol, (DRISDOL) 50000 units CAPS capsule Take 50,000 Units by mouth every 7 (seven) days. 06/01/17  Yes [provider]  albuterol (PROVENTIL) (2.5 MG/3ML) 0.083% nebulizer solution  Take 3 mLs (2.5 mg total) by nebulization every 6 (six) hours as needed for wheezing or shortness of breath. 01/19/17 12/18/18  Laban Emperor, PA-C    Allergies: Allergies  Allergen Reactions   Azithromycin Swelling    Review of Systems: Review of Systems  Constitutional:  Negative for chills and fever.  Respiratory:  Negative for shortness of breath.   Cardiovascular:  Negative for chest pain.  Gastrointestinal:  Negative for nausea and vomiting.  Skin:        Right breast abscess / drainage   Physical  Exam BP (!) 150/63   Pulse 89   Temp 98.1 F (36.7 C)   Ht '5\' 2"'  (1.575 m)   Wt 155 lb (70.3 kg)   SpO2 99%   BMI 28.35 kg/m  CONSTITUTIONAL: No acute distress HEENT:  Normocephalic, atraumatic, extraocular motion intact. RESPIRATORY:  Normal respiratory effort without pathologic use of accessory muscles. CARDIOVASCULAR: Regular rhythm and rate. BREAST:  Right breast has again at the 10-11 o'clock region a 3 x 2 cm area of skin thinning with peeling, with underlying firm tissue consistent with the calcifications noted on her ultrasound.  There is a small scab measuring around 4 mm in size, but no active drainage.  There is some mild tissue swelling and erythema, and she has some tenderness to palpation.   NEUROLOGIC:  Motor and sensation is grossly normal.  Cranial nerves are grossly intact. PSYCH:  Alert and oriented to person, place and time. Affect is normal.  Labs/Imaging: Mammogram / [place] right breast 11/25/20: FINDINGS: There are stable postsurgical changes within the RIGHT breast. There are no new dominant masses, suspicious calcifications or secondary signs of malignancy within the RIGHT breast.   There is skin thickening of the outer RIGHT breast, corresponding to the palpable area of concern. There is an underlying unrelated large dystrophic calcification within the superficial soft tissues of the outer RIGHT breast compatible with benign dystrophic/fat necrosis calcification related to the previous lumpectomy.   On physical exam, there is a focal skin lesion/peeling within the upper-outer quadrant of the RIGHT breast. Patient states that this has an improved appearance compared to its appearance prior to the antibiotics. There is no current drainage at this site.   Patient also describes a palpable area of concern within the upper outer quadrant, above the site of this skin lesion/healing.   Targeted ultrasound is performed, evaluating the upper-outer quadrant of the  RIGHT breast, 11:30 o'clock axis as directed by the patient to a palpable area of concern and 10 o'clock axis corresponding to the site of skin lesion/healing.   At the 11:30 o'clock axis, there is a benign dystrophic/fat necrosis calcifications which is stable mammographically and corresponds to an earlier lumpectomy site. No suspicious solid or cystic mass is seen at this site. No fluid collection.   At the 10 o'clock axis, 3 cm from the nipple, corresponding to the site of patient's skin lesion/peeling, there is mild skin thickening only. No fluid collection or sebaceous cyst within the skin. No fluid collection or abscess within the underlying soft tissues. Incidental note is made of a large benign dystrophic/fat necrosis calcification within the superficial soft tissues, stable mammographically for years and presumably related to previous lumpectomy.   IMPRESSION: 1. No abscess collection is identified within the RIGHT breast. 2. Focal mild skin thickening within the upper-outer quadrant of the RIGHT breast, at and surrounding the site of patient's visible skin lesion/peeling that is located at the 10 o'clock axis of  the RIGHT breast, 3 cm from the nipple. No fluid collection within the skin. No abscess collection within the underlying breast tissues. Patient states that clinical findings have improved after course of antibiotics. 3. Stable postsurgical changes within the RIGHT breast.  Assessment and Plan: This is a 65 y.o. female with a recurrent right breast abscess.  --Discussed with the patient that on exam today, there is no significant area of fluctuance and when using the ultrasound at bedside, there is no area of fluid collection.  The skin does appear thickened and just beneath it is the area of calcification.  Discussed with her that my suspicion is that the area of calcification is pushing up against her skin so much that the skin is getting thinner and it's  potentially leading to superficial infections.  As such, the long-term goal would be to resect that area to prevent further infection and/or skin necrosis.  First, however, we should treat this recurrent infection and will prescribe another course of Bactrim for her and the follow up in about 10 days to reassess. If the area has improved, then discussed with her that we would plan to schedule her for right breast abscess excision as outpatient in the beginning of January 2023. --Patient is in agreement with this plan and all of her questions have been answered.  I spent 25 minutes dedicated to the care of this patient on the date of this encounter to include pre-visit review of records, face-to-face time with the patient discussing diagnosis and management, and any post-visit coordination of care.   Melvyn Neth, [facility]

## 2020-12-27 ENCOUNTER — Ambulatory Visit: Payer: BC Managed Care – PPO | Admitting: Surgery

## 2021-01-13 ENCOUNTER — Ambulatory Visit: Payer: BC Managed Care – PPO | Admitting: Surgery

## 2021-01-13 ENCOUNTER — Other Ambulatory Visit: Payer: Self-pay

## 2021-01-13 ENCOUNTER — Encounter: Payer: Self-pay | Admitting: Surgery

## 2021-01-13 VITALS — BP 165/84 | HR 69 | Temp 98.2°F | Ht 62.0 in | Wt 157.0 lb

## 2021-01-13 DIAGNOSIS — N611 Abscess of the breast and nipple: Secondary | ICD-10-CM | POA: Diagnosis not present

## 2021-01-13 NOTE — H&P (View-Only) (Signed)
01/13/2021  History of Present Illness: Adriana Berg is a 66 y.o. female with a history of right breast abscess coming today for follow up.  She was initially seen for this on 10/26 and was started on Bactrim.  A follow up ultrasound/mammogram did not show any abscess and showed calcifications changes in the area of concern.  She had a recurrence of this in December and a new Bactrim course was given.  This has improved since then and now she presents to discuss excision of this area to prevent further recurrences. She denies any right breast pain, and reports some minimal drainage at the scab site, non-purulent.  Past Medical History: Past Medical History:  Diagnosis Date   Borderline diabetes    Breast cancer ([place]) 2007   RT LUMPECTOMY   Cancer ([place])    Lab test positive for detection of COVID-19 virus 12/2018   Personal history of malignant neoplasm of breast    invasive ductal carcinoma with tubular features,Stage 1,ER pos,HER2 neg   Personal history of radiation therapy 2007   Personal history of tobacco use, presenting hazards to health    Pneumonia Feb 2017   Radiation 2007   BREAST CA   Unspecified essential hypertension      Past Surgical History: Past Surgical History:  Procedure Laterality Date   ABDOMINAL HYSTERECTOMY  1995   AXILLARY SENTINEL NODE BIOPSY Right 2007   BREAST BIOPSY Right 2007   CORE - POS   BREAST BIOPSY Right 1995   EXCISIONAL - NEG   BREAST LUMPECTOMY Right 2007   breast ca   BREAST SURGERY  2007   right lumpectomy   COLONOSCOPY  12/27/2012   COLONOSCOPY WITH PROPOFOL N/A 02/14/2019   Procedure: COLONOSCOPY WITH PROPOFOL;  Surgeon: Jonathon Bellows, MD;  Location: [facility] ENDOSCOPY;  Service: Gastroenterology;  Laterality: N/A;    Home Medications: Prior to Admission medications   Medication Sig Start Date End Date Taking? Authorizing Provider  amLODipine (NORVASC) 2.5 MG tablet Take 2.5 mg by mouth daily.   Yes [provider]   Cholecalciferol (VITAMIN D3 PO) Take 50,000 Units by mouth daily.   Yes [provider]  CVS ASPIRIN ADULT LOW STRENGTH 81 MG EC tablet Take 81 mg by mouth daily. 07/07/19  Yes [provider]  loratadine (CLARITIN) 10 MG tablet Take 10 mg by mouth daily. 05/15/19  Yes [provider]  quinapril (ACCUPRIL) 40 MG tablet Take 40 mg by mouth at bedtime.   Yes [provider]  rosuvastatin (CRESTOR) 20 MG tablet Take 20 mg by mouth at bedtime. 04/27/19  Yes [provider]  spironolactone (ALDACTONE) 50 MG tablet Take 50 mg by mouth daily.  06/17/17  Yes [provider]  VITAMIN D PO Take 1,000 Units by mouth.   Yes [provider]  Vitamin D, Ergocalciferol, (DRISDOL) 50000 units CAPS capsule Take 50,000 Units by mouth every 7 (seven) days. 06/01/17  Yes [provider]  albuterol (PROVENTIL) (2.5 MG/3ML) 0.083% nebulizer solution Take 3 mLs (2.5 mg total) by nebulization every 6 (six) hours as needed for wheezing or shortness of breath. 01/19/17 12/18/18  Laban Emperor, PA-C    Allergies: Allergies  Allergen Reactions   Azithromycin Swelling    Review of Systems: Review of Systems  Constitutional:  Negative for chills and fever.  Respiratory:  Negative for shortness of breath.   Cardiovascular:  Negative for chest pain.  Gastrointestinal:  Negative for abdominal pain, nausea and vomiting.  Skin:  Right breast with scab and minimal drainage   Physical Exam BP (!) 165/84    Pulse 69    Temp 98.2 F (36.8 C)    Ht '5\' 2"'  (1.575 m)    Wt 157 lb (71.2 kg)    SpO2 99%    BMI 28.72 kg/m  CONSTITUTIONAL: No acute distress, well nourished. HEENT:  Normocephalic, atraumatic, extraocular motion intact. NECK:  Trachea is midline, no jugular venous distention. RESPIRATORY:  Lungs are clear, and breath sounds are equal bilaterally. Normal respiratory effort without pathologic use of accessory muscles. CARDIOVASCULAR: Heart is  regular without murmurs, gallops, or rubs. BREAST:  Right breast with area of palpable subcutaneous calcification near the areola at 10 o'clock position.  The area of firmness is about 2 x 2.5 cm in size, with thin overlying skin and two small scabs.  No active drainage, no erythema or induration, and no tenderness to palpation. NEUROLOGIC:  Motor and sensation is grossly normal.  Cranial nerves are grossly intact. PSYCH:  Alert and oriented to person, place and time. Affect is normal.  Labs/Imaging: Mammogram and U/S Right breast 11/25/20: IMPRESSION: 1. No abscess collection is identified within the RIGHT breast. 2. Focal mild skin thickening within the upper-outer quadrant of the RIGHT breast, at and surrounding the site of patient's visible skin lesion/peeling that is located at the 10 o'clock axis of the RIGHT breast, 3 cm from the nipple. No fluid collection within the skin. No abscess collection within the underlying breast tissues. Patient states that clinical findings have improved after course of antibiotics. 3. Stable postsurgical changes within the RIGHT breast.  Assessment and Plan: This is a 66 y.o. female with a right breast abscess.  --Discussed with the patient that the abscess she had is in the area of her calcification at the 10 o'clock position just outside the areola.  I think the calcification is very superficial within the breast tissue and is causing the irritation of the overlying skin.  That may be why the skin is much thinner there.  Now the the main abscess and infection have healed, discussed with her that we can plan for excision of that area.  Discussed with her the plan for excision of the abscess area and calcification down to healthy breast tissue and closing the incision as long as there's no residual infection.  Reviewed with her the surgery at length including risks of bleeding, infection, injury to surrounding structures, that this is an outpatient surgery,  pain control after, and she's willing to proceed.  As a precaution, would also send the calcification tissue to pathology for analysis. --Will schedule her for right breast abscess excision on 01/28/21.  All questions answered.  Last dose of Aspirin would be on 01/22/21.  I spent 40 minutes dedicated to the care of this patient on the date of this encounter to include pre-visit review of records, face-to-face time with the patient discussing diagnosis and management, and any post-visit coordination of care.   Melvyn Neth, [facility]

## 2021-01-13 NOTE — Progress Notes (Signed)
01/13/2021  History of Present Illness: Adriana Berg is a 66 y.o. female with a history of right breast abscess coming today for follow up.  She was initially seen for this on 10/26 and was started on Bactrim.  A follow up ultrasound/mammogram did not show any abscess and showed calcifications changes in the area of concern.  She had a recurrence of this in December and a new Bactrim course was given.  This has improved since then and now she presents to discuss excision of this area to prevent further recurrences. She denies any right breast pain, and reports some minimal drainage at the scab site, non-purulent.  Past Medical History: Past Medical History:  Diagnosis Date   Borderline diabetes    Breast cancer ([place]) 2007   RT LUMPECTOMY   Cancer ([place])    Lab test positive for detection of COVID-19 virus 12/2018   Personal history of malignant neoplasm of breast    invasive ductal carcinoma with tubular features,Stage 1,ER pos,HER2 neg   Personal history of radiation therapy 2007   Personal history of tobacco use, presenting hazards to health    Pneumonia Feb 2017   Radiation 2007   BREAST CA   Unspecified essential hypertension      Past Surgical History: Past Surgical History:  Procedure Laterality Date   ABDOMINAL HYSTERECTOMY  1995   AXILLARY SENTINEL NODE BIOPSY Right 2007   BREAST BIOPSY Right 2007   CORE - POS   BREAST BIOPSY Right 1995   EXCISIONAL - NEG   BREAST LUMPECTOMY Right 2007   breast ca   BREAST SURGERY  2007   right lumpectomy   COLONOSCOPY  12/27/2012   COLONOSCOPY WITH PROPOFOL N/A 02/14/2019   Procedure: COLONOSCOPY WITH PROPOFOL;  Surgeon: Jonathon Bellows, MD;  Location: [facility] ENDOSCOPY;  Service: Gastroenterology;  Laterality: N/A;    Home Medications: Prior to Admission medications   Medication Sig Start Date End Date Taking? Authorizing Provider  amLODipine (NORVASC) 2.5 MG tablet Take 2.5 mg by mouth daily.   Yes [provider]   Cholecalciferol (VITAMIN D3 PO) Take 50,000 Units by mouth daily.   Yes [provider]  CVS ASPIRIN ADULT LOW STRENGTH 81 MG EC tablet Take 81 mg by mouth daily. 07/07/19  Yes [provider]  loratadine (CLARITIN) 10 MG tablet Take 10 mg by mouth daily. 05/15/19  Yes [provider]  quinapril (ACCUPRIL) 40 MG tablet Take 40 mg by mouth at bedtime.   Yes [provider]  rosuvastatin (CRESTOR) 20 MG tablet Take 20 mg by mouth at bedtime. 04/27/19  Yes [provider]  spironolactone (ALDACTONE) 50 MG tablet Take 50 mg by mouth daily.  06/17/17  Yes [provider]  VITAMIN D PO Take 1,000 Units by mouth.   Yes [provider]  Vitamin D, Ergocalciferol, (DRISDOL) 50000 units CAPS capsule Take 50,000 Units by mouth every 7 (seven) days. 06/01/17  Yes [provider]  albuterol (PROVENTIL) (2.5 MG/3ML) 0.083% nebulizer solution Take 3 mLs (2.5 mg total) by nebulization every 6 (six) hours as needed for wheezing or shortness of breath. 01/19/17 12/18/18  Laban Emperor, PA-C    Allergies: Allergies  Allergen Reactions   Azithromycin Swelling    Review of Systems: Review of Systems  Constitutional:  Negative for chills and fever.  Respiratory:  Negative for shortness of breath.   Cardiovascular:  Negative for chest pain.  Gastrointestinal:  Negative for abdominal pain, nausea and vomiting.  Skin:  Right breast with scab and minimal drainage   Physical Exam BP (!) 165/84    Pulse 69    Temp 98.2 F (36.8 C)    Ht '5\' 2"'  (1.575 m)    Wt 157 lb (71.2 kg)    SpO2 99%    BMI 28.72 kg/m  CONSTITUTIONAL: No acute distress, well nourished. HEENT:  Normocephalic, atraumatic, extraocular motion intact. NECK:  Trachea is midline, no jugular venous distention. RESPIRATORY:  Lungs are clear, and breath sounds are equal bilaterally. Normal respiratory effort without pathologic use of accessory muscles. CARDIOVASCULAR: Heart is  regular without murmurs, gallops, or rubs. BREAST:  Right breast with area of palpable subcutaneous calcification near the areola at 10 o'clock position.  The area of firmness is about 2 x 2.5 cm in size, with thin overlying skin and two small scabs.  No active drainage, no erythema or induration, and no tenderness to palpation. NEUROLOGIC:  Motor and sensation is grossly normal.  Cranial nerves are grossly intact. PSYCH:  Alert and oriented to person, place and time. Affect is normal.  Labs/Imaging: Mammogram and U/S Right breast 11/25/20: IMPRESSION: 1. No abscess collection is identified within the RIGHT breast. 2. Focal mild skin thickening within the upper-outer quadrant of the RIGHT breast, at and surrounding the site of patient's visible skin lesion/peeling that is located at the 10 o'clock axis of the RIGHT breast, 3 cm from the nipple. No fluid collection within the skin. No abscess collection within the underlying breast tissues. Patient states that clinical findings have improved after course of antibiotics. 3. Stable postsurgical changes within the RIGHT breast.  Assessment and Plan: This is a 66 y.o. female with a right breast abscess.  --Discussed with the patient that the abscess she had is in the area of her calcification at the 10 o'clock position just outside the areola.  I think the calcification is very superficial within the breast tissue and is causing the irritation of the overlying skin.  That may be why the skin is much thinner there.  Now the the main abscess and infection have healed, discussed with her that we can plan for excision of that area.  Discussed with her the plan for excision of the abscess area and calcification down to healthy breast tissue and closing the incision as long as there's no residual infection.  Reviewed with her the surgery at length including risks of bleeding, infection, injury to surrounding structures, that this is an outpatient surgery,  pain control after, and she's willing to proceed.  As a precaution, would also send the calcification tissue to pathology for analysis. --Will schedule her for right breast abscess excision on 01/28/21.  All questions answered.  Last dose of Aspirin would be on 01/22/21.  I spent 40 minutes dedicated to the care of this patient on the date of this encounter to include pre-visit review of records, face-to-face time with the patient discussing diagnosis and management, and any post-visit coordination of care.   Melvyn Neth, [facility]

## 2021-01-13 NOTE — Patient Instructions (Addendum)
We spoke today about doing an excision of your breast. This will be done at [facility] by Dr Hampton Abbot. We are looking at doing this on January 24th.   Our surgery scheduler will call you to look at surgery dates and to go over information. Please see your [facility] Surgery sheet.  You will need a driver the day of surgery.   You may need to be out of work for 3-5 day for this.

## 2021-01-14 ENCOUNTER — Telehealth: Payer: Self-pay | Admitting: Surgery

## 2021-01-14 NOTE — Telephone Encounter (Signed)
Per Dr. Hampton Abbot, wants patient to stop her aspirin prior to surgery on 01/28/21 with her last dose of aspirin being on 01/22/21.  Patient is called and informed of this and verbalized understanding.

## 2021-01-14 NOTE — Telephone Encounter (Signed)
Patient has been advised of Pre-Admission date/time, COVID Testing date and Surgery date.  Surgery Date: 01/28/21 Preadmission Testing Date: 01/21/21 (phone 7:30 am -1:00 pm) Covid Testing Date: Not needed.    Patient has been made aware to call [phone], between 1-3:00pm the day before surgery, to find out what time to arrive for surgery.

## 2021-01-21 ENCOUNTER — Other Ambulatory Visit: Payer: Self-pay

## 2021-01-21 ENCOUNTER — Encounter
Admission: RE | Admit: 2021-01-21 | Discharge: 2021-01-21 | Disposition: A | Discharge status: Home / Self Care | Payer: BC Managed Care – PPO | Source: Ambulatory Visit | Attending: Surgery | Admitting: Surgery

## 2021-01-21 VITALS — Ht 62.0 in | Wt 152.0 lb

## 2021-01-21 DIAGNOSIS — I10 Essential (primary) hypertension: Secondary | ICD-10-CM

## 2021-01-21 DIAGNOSIS — Z01812 Encounter for preprocedural laboratory examination: Secondary | ICD-10-CM

## 2021-01-21 NOTE — Patient Instructions (Addendum)
Your procedure is scheduled on: Tuesday, January 24 Report to the Registration Desk on the 1st floor of the [facility]. To find out your arrival time, please call [phone] between 1PM - 3PM on: Monday, January 23  REMEMBER: Instructions that are not followed completely may result in serious medical risk, up to and including death; or upon the discretion of your surgeon and anesthesiologist your surgery may need to be rescheduled.  Do not eat food after midnight the night before surgery.  No gum chewing, lozengers or hard candies.  You may however, drink CLEAR liquids up to 2 hours before you are scheduled to arrive for your surgery. Do not drink anything within 2 hours of your scheduled arrival time.  Clear liquids include: - water  - apple juice without pulp - gatorade (not RED, PURPLE, OR BLUE) - black coffee or tea (Do NOT add milk or creamers to the coffee or tea) Do NOT drink anything that is not on this list.  TAKE THESE MEDICATIONS THE MORNING OF SURGERY WITH A SIP OF WATER:  Amlodipine  According to Dr. Mont Dutton note: Stop aspirin 5 days prior to surgery. Last day to take Aspirin is Wednesday, January 18. Resume AFTER surgery per surgeon instruction.  One week prior to surgery: starting January 17 Stop Anti-inflammatories (NSAIDS) such as Advil, Aleve, Ibuprofen, Motrin, Naproxen, Naprosyn and Aspirin based products such as Excedrin, Goodys Powder, BC Powder. Stop ANY OVER THE COUNTER supplements until after surgery. You may however, continue to take Tylenol if needed for pain up until the day of surgery.  No Alcohol for 24 hours before or after surgery.  No Smoking including e-cigarettes for 24 hours prior to surgery.  No chewable tobacco products for at least 6 hours prior to surgery.  No nicotine patches on the day of surgery.  Do not use any "recreational" drugs for at least a week prior to your surgery.  Please be advised that the combination of cocaine and  anesthesia may have negative outcomes, up to and including death. If you test positive for cocaine, your surgery will be cancelled.  On the morning of surgery brush your teeth with toothpaste and water, you may rinse your mouth with mouthwash if you wish. Do not swallow any toothpaste or mouthwash.  Use CHG Soap as directed on instruction sheet.  Do not wear jewelry, make-up, hairpins, clips or nail polish.  Do not wear lotions, powders, or perfumes.   Do not shave body from the neck down 48 hours prior to surgery just in case you cut yourself which could leave a site for infection.  Also, freshly shaved skin may become irritated if using the CHG soap.  Contact lenses, hearing aids and dentures may not be worn into surgery.  Do not bring valuables to the hospital. [facility] is not responsible for any missing/lost belongings or valuables.   Notify your doctor if there is any change in your medical condition (cold, fever, infection).  Wear comfortable clothing (specific to your surgery type) to the hospital.  After surgery, you can help prevent lung complications by doing breathing exercises.  Take deep breaths and cough every 1-2 hours. Your doctor may order a device called an Incentive Spirometer to help you take deep breaths.  If you are being discharged the day of surgery, you will not be allowed to drive home. You will need a responsible adult (18 years or older) to drive you home and stay with you that night.   If  you are taking public transportation, you will need to have a responsible adult (18 years or older) with you. Please confirm with your physician that it is acceptable to use public transportation.   Please call the [place] [facility] at [phone] if you have any questions about these instructions.  Surgery Visitation Policy:  Patients undergoing a surgery or procedure may have one family member or support person with them as long as that person is  not COVID-19 positive or experiencing its symptoms.  That person may remain in the waiting area during the procedure and may rotate out with other people.

## 2021-01-22 ENCOUNTER — Encounter
Admission: RE | Admit: 2021-01-22 | Discharge: 2021-01-22 | Disposition: A | Discharge status: Home / Self Care | Payer: BC Managed Care – PPO | Source: Ambulatory Visit | Attending: Surgery | Admitting: Surgery

## 2021-01-22 DIAGNOSIS — I10 Essential (primary) hypertension: Secondary | ICD-10-CM | POA: Diagnosis not present

## 2021-01-22 DIAGNOSIS — Z01818 Encounter for other preprocedural examination: Secondary | ICD-10-CM | POA: Insufficient documentation

## 2021-01-22 DIAGNOSIS — Z01812 Encounter for preprocedural laboratory examination: Secondary | ICD-10-CM

## 2021-01-22 DIAGNOSIS — Z01810 Encounter for preprocedural cardiovascular examination: Secondary | ICD-10-CM | POA: Diagnosis not present

## 2021-01-22 LAB — CBC
HCT: 43.8 % (ref 36.0–46.0)
Hemoglobin: 15 g/dL (ref 12.0–15.0)
MCH: 31.5 pg (ref 26.0–34.0)
MCHC: 34.2 g/dL (ref 30.0–36.0)
MCV: 92 fL (ref 80.0–100.0)
Platelets: 208 10*3/uL (ref 150–400)
RBC: 4.76 MIL/uL (ref 3.87–5.11)
RDW: 12 % (ref 11.5–15.5)
WBC: 4.9 10*3/uL (ref 4.0–10.5)
nRBC: 0 % (ref 0.0–0.2)

## 2021-01-22 LAB — BASIC METABOLIC PANEL
Anion gap: 6 (ref 5–15)
BUN: 12 mg/dL (ref 8–23)
CO2: 26 mmol/L (ref 22–32)
Calcium: 9.3 mg/dL (ref 8.9–10.3)
Chloride: 103 mmol/L (ref 98–111)
Creatinine, Ser: 0.96 mg/dL (ref 0.44–1.00)
GFR, Estimated: >60 mL/min (ref >60)
Glucose, Bld: 86 mg/dL (ref 70–99)
Potassium: 4.3 mmol/L (ref 3.5–5.1)
Sodium: 135 mmol/L (ref 135–145)

## 2021-01-28 ENCOUNTER — Ambulatory Visit
Admission: RE | Admit: 2021-01-28 | Discharge: 2021-01-28 | Disposition: A | Discharge status: Home / Self Care | Payer: BC Managed Care – PPO | Attending: Surgery | Admitting: Surgery

## 2021-01-28 ENCOUNTER — Encounter: Payer: Self-pay | Admitting: Surgery

## 2021-01-28 ENCOUNTER — Ambulatory Visit: Payer: BC Managed Care – PPO | Admitting: Anesthesiology

## 2021-01-28 ENCOUNTER — Other Ambulatory Visit: Payer: Self-pay

## 2021-01-28 ENCOUNTER — Encounter
Admission: RE | Disposition: A | Discharge status: Home / Self Care | Payer: Self-pay | Source: Home / Self Care | Attending: Surgery

## 2021-01-28 DIAGNOSIS — Z923 Personal history of irradiation: Secondary | ICD-10-CM | POA: Diagnosis not present

## 2021-01-28 DIAGNOSIS — R7303 Prediabetes: Secondary | ICD-10-CM | POA: Insufficient documentation

## 2021-01-28 DIAGNOSIS — R921 Mammographic calcification found on diagnostic imaging of breast: Secondary | ICD-10-CM | POA: Insufficient documentation

## 2021-01-28 DIAGNOSIS — Z853 Personal history of malignant neoplasm of breast: Secondary | ICD-10-CM | POA: Insufficient documentation

## 2021-01-28 DIAGNOSIS — N6310 Unspecified lump in the right breast, unspecified quadrant: Secondary | ICD-10-CM | POA: Diagnosis present

## 2021-01-28 DIAGNOSIS — N611 Abscess of the breast and nipple: Secondary | ICD-10-CM | POA: Diagnosis not present

## 2021-01-28 DIAGNOSIS — N6311 Unspecified lump in the right breast, upper outer quadrant: Secondary | ICD-10-CM | POA: Diagnosis not present

## 2021-01-28 DIAGNOSIS — F17200 Nicotine dependence, unspecified, uncomplicated: Secondary | ICD-10-CM | POA: Insufficient documentation

## 2021-01-28 DIAGNOSIS — N6041 Mammary duct ectasia of right breast: Secondary | ICD-10-CM | POA: Diagnosis not present

## 2021-01-28 DIAGNOSIS — I10 Essential (primary) hypertension: Secondary | ICD-10-CM | POA: Diagnosis not present

## 2021-01-28 HISTORY — PX: EXCISION OF BREAST BIOPSY: SHX5822

## 2021-01-28 SURGERY — EXCISION OF BREAST BIOPSY
Anesthesia: General | Site: Breast | Laterality: Right

## 2021-01-28 MED ORDER — CHLORHEXIDINE GLUCONATE 0.12 % MT SOLN
OROMUCOSAL | Status: AC
  Administered 2021-01-28: 14:00:00 15 mL via OROMUCOSAL
  Filled 2021-01-28: qty 15

## 2021-01-28 MED ORDER — ACETAMINOPHEN 500 MG PO TABS
ORAL_TABLET | ORAL | Status: AC
  Administered 2021-01-28: 14:00:00 1000 mg via ORAL
  Filled 2021-01-28: qty 2

## 2021-01-28 MED ORDER — CEFAZOLIN SODIUM-DEXTROSE 2-4 GM/100ML-% IV SOLN
INTRAVENOUS | Status: AC
  Filled 2021-01-28: qty 100

## 2021-01-28 MED ORDER — FAMOTIDINE 20 MG PO TABS: 20.0000 mg | ORAL_TABLET | Freq: Once | ORAL | Status: AC

## 2021-01-28 MED ORDER — CHLORHEXIDINE GLUCONATE CLOTH 2 % EX PADS: 6.0000 | MEDICATED_PAD | Freq: Once | CUTANEOUS | Status: DC

## 2021-01-28 MED ORDER — PROPOFOL 10 MG/ML IV BOLUS
INTRAVENOUS | Status: AC
  Filled 2021-01-28: qty 20

## 2021-01-28 MED ORDER — LIDOCAINE HCL (CARDIAC) PF 100 MG/5ML IV SOSY
PREFILLED_SYRINGE | INTRAVENOUS | Status: DC | PRN
Start: 2021-01-28 — End: 2021-01-28
  Administered 2021-01-28: 80 mg via INTRAVENOUS

## 2021-01-28 MED ORDER — GLYCOPYRROLATE 0.2 MG/ML IJ SOLN
INTRAMUSCULAR | Status: DC | PRN
  Administered 2021-01-28: .2 mg via INTRAVENOUS

## 2021-01-28 MED ORDER — KETOROLAC TROMETHAMINE 30 MG/ML IJ SOLN
INTRAMUSCULAR | Status: DC | PRN
Start: 2021-01-28 — End: 2021-01-28
  Administered 2021-01-28: 30 mg via INTRAVENOUS

## 2021-01-28 MED ORDER — ONDANSETRON HCL 4 MG/2ML IJ SOLN
INTRAMUSCULAR | Status: DC | PRN
  Administered 2021-01-28: 4 mg via INTRAVENOUS

## 2021-01-28 MED ORDER — LIDOCAINE HCL (PF) 2 % IJ SOLN
INTRAMUSCULAR | Status: AC
  Filled 2021-01-28: qty 5

## 2021-01-28 MED ORDER — FENTANYL CITRATE (PF) 100 MCG/2ML IJ SOLN: 25.0000 ug | INTRAMUSCULAR | Status: DC | PRN

## 2021-01-28 MED ORDER — ONDANSETRON HCL 4 MG/2ML IJ SOLN: 4.0000 mg | Freq: Once | INTRAMUSCULAR | Status: DC | PRN

## 2021-01-28 MED ORDER — GABAPENTIN 300 MG PO CAPS: 300.0000 mg | ORAL_CAPSULE | ORAL | Status: AC

## 2021-01-28 MED ORDER — CEFAZOLIN SODIUM-DEXTROSE 2-4 GM/100ML-% IV SOLN
2.0000 g | INTRAVENOUS | Status: AC
  Administered 2021-01-28: 15:00:00 2 g via INTRAVENOUS

## 2021-01-28 MED ORDER — MIDAZOLAM HCL 2 MG/2ML IJ SOLN
INTRAMUSCULAR | Status: DC | PRN
  Administered 2021-01-28: 1 mg via INTRAVENOUS

## 2021-01-28 MED ORDER — FENTANYL CITRATE (PF) 100 MCG/2ML IJ SOLN
INTRAMUSCULAR | Status: AC
  Filled 2021-01-28: qty 2

## 2021-01-28 MED ORDER — DEXAMETHASONE SODIUM PHOSPHATE 10 MG/ML IJ SOLN
INTRAMUSCULAR | Status: DC | PRN
  Administered 2021-01-28: 4 mg via INTRAVENOUS

## 2021-01-28 MED ORDER — IBUPROFEN 600 MG PO TABS: 600.0000 mg | ORAL_TABLET | Freq: Three times a day (TID) | ORAL | 1 refills | Status: DC | PRN

## 2021-01-28 MED ORDER — PHENYLEPHRINE HCL (PRESSORS) 10 MG/ML IV SOLN
INTRAVENOUS | Status: DC | PRN
  Administered 2021-01-28 (×3): 80 ug via INTRAVENOUS

## 2021-01-28 MED ORDER — BUPIVACAINE LIPOSOME 1.3 % IJ SUSP
INTRAMUSCULAR | Status: AC
  Filled 2021-01-28: qty 10

## 2021-01-28 MED ORDER — OXYCODONE HCL 5 MG/5ML PO SOLN: 5.0000 mg | Freq: Once | ORAL | Status: DC | PRN

## 2021-01-28 MED ORDER — LACTATED RINGERS IV SOLN: INTRAVENOUS | Status: DC

## 2021-01-28 MED ORDER — OXYCODONE HCL 5 MG PO TABS: 5.0000 mg | ORAL_TABLET | ORAL | 0 refills | Status: DC | PRN

## 2021-01-28 MED ORDER — OXYCODONE HCL 5 MG PO TABS: 5.0000 mg | ORAL_TABLET | Freq: Once | ORAL | Status: DC | PRN

## 2021-01-28 MED ORDER — PROPOFOL 10 MG/ML IV BOLUS
INTRAVENOUS | Status: DC | PRN
Start: 2021-01-28 — End: 2021-01-28
  Administered 2021-01-28: 150 mg via INTRAVENOUS

## 2021-01-28 MED ORDER — ACETAMINOPHEN 500 MG PO TABS: 1000.0000 mg | ORAL_TABLET | Freq: Four times a day (QID) | ORAL | Status: DC | PRN

## 2021-01-28 MED ORDER — CHLORHEXIDINE GLUCONATE 0.12 % MT SOLN: 15.0000 mL | Freq: Once | OROMUCOSAL | Status: AC

## 2021-01-28 MED ORDER — GABAPENTIN 300 MG PO CAPS
ORAL_CAPSULE | ORAL | Status: AC
  Administered 2021-01-28: 14:00:00 300 mg via ORAL
  Filled 2021-01-28: qty 1

## 2021-01-28 MED ORDER — ORAL CARE MOUTH RINSE: 15.0000 mL | Freq: Once | OROMUCOSAL | Status: AC

## 2021-01-28 MED ORDER — ONDANSETRON HCL 4 MG/2ML IJ SOLN
INTRAMUSCULAR | Status: AC
  Filled 2021-01-28: qty 2

## 2021-01-28 MED ORDER — DEXAMETHASONE SODIUM PHOSPHATE 10 MG/ML IJ SOLN
INTRAMUSCULAR | Status: AC
  Filled 2021-01-28: qty 1

## 2021-01-28 MED ORDER — BUPIVACAINE-EPINEPHRINE (PF) 0.5% -1:200000 IJ SOLN
INTRAMUSCULAR | Status: AC
  Filled 2021-01-28: qty 30

## 2021-01-28 MED ORDER — 0.9 % SODIUM CHLORIDE (POUR BTL) OPTIME
TOPICAL | Status: DC | PRN
  Administered 2021-01-28: 15:00:00 500 mL

## 2021-01-28 MED ORDER — FAMOTIDINE 20 MG PO TABS
ORAL_TABLET | ORAL | Status: AC
  Administered 2021-01-28: 14:00:00 20 mg via ORAL
  Filled 2021-01-28: qty 1

## 2021-01-28 MED ORDER — CHLORHEXIDINE GLUCONATE CLOTH 2 % EX PADS
6.0000 | MEDICATED_PAD | Freq: Once | CUTANEOUS | Status: AC
  Administered 2021-01-28: 14:00:00 6 via TOPICAL

## 2021-01-28 MED ORDER — MIDAZOLAM HCL 2 MG/2ML IJ SOLN
INTRAMUSCULAR | Status: AC
  Filled 2021-01-28: qty 2

## 2021-01-28 MED ORDER — BUPIVACAINE-EPINEPHRINE (PF) 0.5% -1:200000 IJ SOLN
INTRAMUSCULAR | Status: DC | PRN
  Administered 2021-01-28: 15:00:00 40 mL

## 2021-01-28 MED ORDER — FENTANYL CITRATE (PF) 100 MCG/2ML IJ SOLN
INTRAMUSCULAR | Status: DC | PRN
  Administered 2021-01-28: 50 ug via INTRAVENOUS

## 2021-01-28 MED ORDER — ACETAMINOPHEN 500 MG PO TABS: 1000.0000 mg | ORAL_TABLET | ORAL | Status: AC

## 2021-01-28 SURGICAL SUPPLY — 60 items
ADH SKN CLS APL DERMABOND .7 (GAUZE/BANDAGES/DRESSINGS) ×1
APL PRP STRL LF DISP 70% ISPRP (MISCELLANEOUS) ×1
APPLIER CLIP 9.375 SM OPEN (CLIP)
APR CLP SM 9.3 20 MLT OPN (CLIP)
BINDER BREAST LRG (GAUZE/BANDAGES/DRESSINGS) ×2 IMPLANT
BINDER BREAST XLRG (GAUZE/BANDAGES/DRESSINGS) IMPLANT
BLADE PHOTON ILLUMINATED (MISCELLANEOUS) ×1 IMPLANT
BLADE SURG 15 STRL LF DISP TIS (BLADE) ×2 IMPLANT
BLADE SURG 15 STRL SS (BLADE) ×6
CHLORAPREP W/TINT 26 (MISCELLANEOUS) ×3 IMPLANT
CLIP APPLIE 9.375 SM OPEN (CLIP) IMPLANT
CNTNR SPEC 2.5X3XGRAD LEK (MISCELLANEOUS) ×1
CONT SPEC 4OZ STER OR WHT (MISCELLANEOUS) ×2
CONT SPEC 4OZ STRL OR WHT (MISCELLANEOUS) ×1
CONTAINER SPEC 2.5X3XGRAD LEK (MISCELLANEOUS) ×1 IMPLANT
COVER PROBE FLX POLY STRL (MISCELLANEOUS) ×3 IMPLANT
DERMABOND ADVANCED (GAUZE/BANDAGES/DRESSINGS) ×2
DERMABOND ADVANCED .7 DNX12 (GAUZE/BANDAGES/DRESSINGS) ×1 IMPLANT
DEVICE DUBIN SPECIMEN MAMMOGRA (MISCELLANEOUS) ×3 IMPLANT
DRAPE LAPAROTOMY 100X77 ABD (DRAPES) ×3 IMPLANT
DRSG GAUZE FLUFF 36X18 (GAUZE/BANDAGES/DRESSINGS) ×3 IMPLANT
ELECT CAUTERY BLADE TIP 2.5 (TIP) ×3
ELECT REM PT RETURN 9FT ADLT (ELECTROSURGICAL) ×3
ELECTRODE CAUTERY BLDE TIP 2.5 (TIP) ×1 IMPLANT
ELECTRODE REM PT RTRN 9FT ADLT (ELECTROSURGICAL) ×1 IMPLANT
GAUZE 4X4 16PLY ~~LOC~~+RFID DBL (SPONGE) ×3 IMPLANT
GLOVE SURG SYN 7.0 (GLOVE) ×3 IMPLANT
GLOVE SURG SYN 7.0 PF PI (GLOVE) ×1 IMPLANT
GLOVE SURG SYN 7.5  E (GLOVE) ×2
GLOVE SURG SYN 7.5 E (GLOVE) ×1 IMPLANT
GLOVE SURG SYN 7.5 PF PI (GLOVE) ×1 IMPLANT
GOWN STRL REUS W/ TWL LRG LVL3 (GOWN DISPOSABLE) ×2 IMPLANT
GOWN STRL REUS W/TWL LRG LVL3 (GOWN DISPOSABLE) ×6
KIT MARKER MARGIN INK (KITS) IMPLANT
KIT TURNOVER KIT A (KITS) ×3 IMPLANT
LABEL OR SOLS (LABEL) ×3 IMPLANT
MANIFOLD NEPTUNE II (INSTRUMENTS) ×3 IMPLANT
MARGIN MAP 10MM (MISCELLANEOUS) IMPLANT
MARKER MARGIN CORRECT CLIP (MARKER) IMPLANT
NDL HYPO 25X1 1.5 SAFETY (NEEDLE) ×1 IMPLANT
NEEDLE HYPO 22GX1.5 SAFETY (NEEDLE) ×3 IMPLANT
NEEDLE HYPO 25X1 1.5 SAFETY (NEEDLE) ×3 IMPLANT
PACK BASIN MINOR ARMC (MISCELLANEOUS) ×3 IMPLANT
SLEVE PROBE SENORX GAMMA FIND (MISCELLANEOUS) IMPLANT
SUT ETHILON 3-0 FS-10 30 BLK (SUTURE)
SUT MNCRL 4-0 (SUTURE) ×3
SUT MNCRL 4-0 27XMFL (SUTURE) ×1
SUT SILK 2 0 (SUTURE) ×3
SUT SILK 2-0 18XBRD TIE 12 (SUTURE) IMPLANT
SUT SILK 3 0 SH 30 (SUTURE) ×3 IMPLANT
SUT VIC AB 3-0 SH 27 (SUTURE) ×3
SUT VIC AB 3-0 SH 27X BRD (SUTURE) ×1 IMPLANT
SUTURE EHLN 3-0 FS-10 30 BLK (SUTURE) ×1 IMPLANT
SUTURE MNCRL 4-0 27XMF (SUTURE) ×1 IMPLANT
SYR 10ML LL (SYRINGE) ×3 IMPLANT
SYR BULB IRRIG 60ML STRL (SYRINGE) ×3 IMPLANT
TAPE TRANSPORE STRL 2 31045 (GAUZE/BANDAGES/DRESSINGS) IMPLANT
TRAP NEPTUNE SPECIMEN COLLECT (MISCELLANEOUS) ×3 IMPLANT
WATER STERILE IRR 1000ML POUR (IV SOLUTION) ×3 IMPLANT
WATER STERILE IRR 500ML POUR (IV SOLUTION) ×3 IMPLANT

## 2021-01-28 NOTE — Anesthesia Postprocedure Evaluation (Signed)
Anesthesia Post Note  Patient: Adriana Berg  Procedure(s) Performed: EXCISION OF BREAST abscess (Right: Breast)  Patient location during evaluation: PACU Anesthesia Type: General Level of consciousness: awake and alert Pain management: pain level controlled Vital Signs Assessment: post-procedure vital signs reviewed and stable Respiratory status: spontaneous breathing, nonlabored ventilation, respiratory function stable and patient connected to nasal cannula oxygen Cardiovascular status: blood pressure returned to baseline and stable Postop Assessment: no apparent nausea or vomiting Anesthetic complications: no   No notable events documented.   Last Vitals:  Vitals:   01/28/21 1615 01/28/21 1630  BP: 140/73 (!) 150/75  Pulse: 77 65  Resp: 17 20  Temp:    SpO2: 97% 97%    Last Pain:  Vitals:   01/28/21 1630  TempSrc:   PainSc: 0-No pain                 Arita Miss

## 2021-01-28 NOTE — Anesthesia Preprocedure Evaluation (Signed)
Anesthesia Evaluation  Patient identified by MRN, date of birth, ID band Patient awake    Reviewed: Allergy & Precautions, NPO status , Patient's Chart, lab work & pertinent test results  History of Anesthesia Complications Negative for: history of anesthetic complications  Airway Mallampati: II  TM Distance: >3 FB Neck ROM: Full    Dental  (+) Upper Dentures, Lower Dentures   Pulmonary neg sleep apnea, neg COPD, Current Smoker and Patient abstained from smoking.,    Pulmonary exam normal breath sounds clear to auscultation       Cardiovascular Exercise Tolerance: Good METShypertension, Pt. on medications (-) CAD and (-) Past MI (-) dysrhythmias  Rhythm:Regular Rate:Normal - Systolic murmurs    Neuro/Psych negative neurological ROS  negative psych ROS   GI/Hepatic neg GERD  ,(+)     (-) substance abuse  ,   Endo/Other  neg diabetes  Renal/GU negative Renal ROS     Musculoskeletal   Abdominal   Peds  Hematology   Anesthesia Other Findings Past Medical History: No date: Borderline diabetes 2007: Breast cancer ([place])     Comment:  RT LUMPECTOMY No date: Cancer ([place]) 12/2018: Lab test positive for detection of COVID-19 virus 06/2015: MRSA (methicillin resistant staph aureus) culture positive     Comment:  lower abdomen/mons pubis abscess No date: Personal history of malignant neoplasm of breast     Comment:  invasive ductal carcinoma with tubular features,Stage               1,ER pos,HER2 neg 2007: Personal history of radiation therapy No date: Personal history of tobacco use, presenting hazards to health 02/2015: Pneumonia 2007: Radiation     Comment:  BREAST CA No date: Unspecified essential hypertension  Reproductive/Obstetrics                             Anesthesia Physical Anesthesia Plan  ASA: 3  Anesthesia Plan: General   Post-op Pain Management: Gabapentin PO (pre-op)  and Tylenol PO (pre-op)   Induction: Intravenous  PONV Risk Score and Plan: 2 and Ondansetron, Dexamethasone and Midazolam  Airway Management Planned: LMA  Additional Equipment: None  Intra-op Plan:   Post-operative Plan: Extubation in OR  Informed Consent: I have reviewed the patients History and Physical, chart, labs and discussed the procedure including the risks, benefits and alternatives for the proposed anesthesia with the patient or authorized representative who has indicated his/her understanding and acceptance.     Dental advisory given  Plan Discussed with: CRNA and Surgeon  Anesthesia Plan Comments: (Discussed risks of anesthesia with patient, including PONV, sore throat, lip/dental/eye damage. Rare risks discussed as well, such as cardiorespiratory and neurological sequelae, and allergic reactions. Discussed the role of CRNA in patient's perioperative care. Patient understands. Patient counseled on benefits of smoking cessation, and increased perioperative risks associated with continued smoking. )        Anesthesia Quick Evaluation

## 2021-01-28 NOTE — Discharge Instructions (Addendum)
AMBULATORY SURGERY  DISCHARGE INSTRUCTIONS   The drugs that you were given will stay in your system until tomorrow so for the next 24 hours you should not:  Drive an automobile Make any legal decisions Drink any alcoholic beverage   You may resume regular meals tomorrow.  Today it is better to start with liquids and gradually work up to solid foods.  You may eat anything you prefer, but it is better to start with liquids, then soup and crackers, and gradually work up to solid foods.   Please notify your doctor immediately if you have any unusual bleeding, trouble breathing, redness and pain at the surgery site, drainage, fever, or pain not relieved by medication.    Additional Instructions:  Can return to work in 2 weeks per Dr. Hampton Abbot      Please contact your physician with any problems or Same Day Surgery at [phone], Monday through Friday 6 am to 4 pm, or [place] at [facility] number at [phone].

## 2021-01-28 NOTE — Transfer of Care (Signed)
Immediate Anesthesia Transfer of Care Note  Patient: Adriana Berg  Procedure(s) Performed: EXCISION OF BREAST abscess (Right: Breast)  Patient Location: PACU  Anesthesia Type:General  Level of Consciousness: awake, drowsy and patient cooperative  Airway & Oxygen Therapy: Patient Spontanous Breathing  Post-op Assessment: Report given to RN and Post -op Vital signs reviewed and stable  Post vital signs: Reviewed and stable  Last Vitals:  Vitals Value Taken Time  BP 134/68 01/28/21 1606  Temp    Pulse 76 01/28/21 1612  Resp 16 01/28/21 1612  SpO2 92 % 01/28/21 1612  Vitals shown include unvalidated device data.  Last Pain:  Vitals:   01/28/21 1419  TempSrc: Temporal  PainSc: 0-No pain         Complications: No notable events documented.

## 2021-01-28 NOTE — Interval H&P Note (Signed)
History and Physical Interval Note:  01/28/2021 2:28 PM  Adriana Berg  has presented today for surgery, with the diagnosis of right breast abscess.  The various methods of treatment have been discussed with the patient and family. After consideration of risks, benefits and other options for treatment, the patient has consented to  Procedure(s): EXCISION OF BREAST abscess (Right) as a surgical intervention.  The patient's history has been reviewed, patient examined, no change in status, stable for surgery.  I have reviewed the patient's chart and labs.  Questions were answered to the patient's satisfaction.     Jaime Dome

## 2021-01-28 NOTE — Anesthesia Procedure Notes (Signed)
Procedure Name: LMA Insertion Date/Time: 01/28/2021 2:54 PM Performed by: Lowry Bowl, CRNA Pre-anesthesia Checklist: Patient identified, Emergency Drugs available, Suction available and Patient being monitored Patient Re-evaluated:Patient Re-evaluated prior to induction Oxygen Delivery Method: Circle system utilized Preoxygenation: Pre-oxygenation with 100% oxygen Induction Type: IV induction Ventilation: Mask ventilation without difficulty LMA: LMA inserted LMA Size: 4.0 Number of attempts: 1 Placement Confirmation: positive ETCO2 and breath sounds checked- equal and bilateral Tube secured with: Tape Dental Injury: Teeth and Oropharynx as per pre-operative assessment

## 2021-01-28 NOTE — Op Note (Signed)
°  Procedure Date:  01/28/2021  Pre-operative Diagnosis:  Right breast mass with skin abscess  Post-operative Diagnosis: Right breast mass with skin abscess  Procedure:  Excision of right breast mass with skin abscess  Surgeon:  Melvyn Neth, MD  Assistant:  Rosita Fire, PA-S  Anesthesia:  General endotracheal  Estimated Blood Loss:  5 ml  Specimens:  Right breast mass -- labeled short suture superior, long suture lateral.  Complications:  None  Indications for Procedure:  This is a 66 y.o. female who presents with right breast mass consisting of a large dystrophic calcification seen on mammogram.  This has been recently causing overlying skin thickening, and small recurrent abscess which has been treated with two courses of antibiotics.  She presents today for excision of this mass.  The risks of bleeding, infection, injury to surrounding structures, hematoma, seroma, open wound, cosmetic deformity, and the need for further surgery were all discussed with the patient and was willing to proceed.  Description of Procedure: The patient was correctly identified in the preoperative area and brought into the operating room.  The patient was placed supine with VTE prophylaxis in place.  Appropriate time-outs were performed.  Anesthesia was induced and the patient was intubated.  Appropriate antibiotics were infused.  The right chest was prepped and draped in usual sterile fashion.  An elliptical 7 cm incision was made over the palpable mass located in the upper outer quadrant just at the edge of the areola.  Cautery was used to dissect down the subcutaneous tissue and breast tissue until reaching the mass.  The mass was then cored out with appropriate margins using cautery.  The mass was oriented with 2-0 Silk sutures as short superior and long lateral.  It was sent off to pathology.  The wound cavity was then thoroughly irrigated and hemostasis was assured with cautery.  Local anesthetic  was infiltrated into the skin and subcutaneous tissue of the cavity.  The wound was then closed in three layers with 3-0 Vicryl and 4-0 Monocryl and sealed with DermaBond.  The area was dressed with fluffed gauze and breast binder.  The patient was emerged from anesthesia and extubated and brought to the recovery room for further management.  The patient tolerated the procedure well and all counts were correct at the end of the case.   Melvyn Neth, MD

## 2021-01-29 ENCOUNTER — Encounter: Payer: Self-pay | Admitting: Surgery

## 2021-01-30 LAB — SURGICAL PATHOLOGY

## 2021-02-12 ENCOUNTER — Encounter: Payer: Self-pay | Admitting: Surgery

## 2021-02-12 ENCOUNTER — Telehealth: Payer: Self-pay | Admitting: *Deleted

## 2021-02-12 ENCOUNTER — Other Ambulatory Visit: Payer: Self-pay

## 2021-02-12 ENCOUNTER — Ambulatory Visit (INDEPENDENT_AMBULATORY_CARE_PROVIDER_SITE_OTHER): Payer: BC Managed Care – PPO | Admitting: Surgery

## 2021-02-12 VITALS — BP 157/88 | HR 75 | Temp 98.3°F | Ht 62.0 in | Wt 152.0 lb

## 2021-02-12 DIAGNOSIS — N611 Abscess of the breast and nipple: Secondary | ICD-10-CM

## 2021-02-12 DIAGNOSIS — Z09 Encounter for follow-up examination after completed treatment for conditions other than malignant neoplasm: Secondary | ICD-10-CM

## 2021-02-12 NOTE — Telephone Encounter (Signed)
Faxed FMLA at [phone]

## 2021-02-12 NOTE — Progress Notes (Signed)
02/12/2021  HPI: Adriana Berg is a 66 y.o. female s/p excision of right breast abscess and calcifications on 01/28/21.  She presents for follow up.  Reports that she has had some soreness still and two days ago noticed some drainage from the wound.  Denies any worsening pain, fevers, or chills.  Vital signs: BP (!) 157/88    Pulse 75    Temp 98.3 F (36.8 C)    Ht 5\' 2"  (1.575 m)    Wt 152 lb (68.9 kg)    SpO2 97%    BMI 27.80 kg/m    Physical Exam: Constitutional:  No acute distress Breast:  Right breast with periareolar UOQ incision is healing well except for the mid portion about 1 cm of superficial dehiscence, with scab already formed to seal the gap.  No active drainage, no significant erythema, no induration.  Assessment/Plan: This is a 66 y.o. female s/p excision of right breast abscess.  --Patient had a small wound dehiscence which allowed some fluid drainage.  Currently no drainage and the wound has sealed.  Recommended that she continue with the breast binder to help with compression of the area.  She may apply vitamin E ointment or cocoa butter lotion to help with the wound healing. --Follow up in two weeks.   Melvyn Neth, [facility]

## 2021-02-12 NOTE — Patient Instructions (Addendum)
Continue to wear your compression binder for the next 1-2 weeks. Keep a dry dressing over the surgery site until it stops draining.   We will have you follow up here in 2 weeks.   We will have you stay out of work for 2 more weeks for healing and recovery.

## 2021-02-26 ENCOUNTER — Ambulatory Visit (INDEPENDENT_AMBULATORY_CARE_PROVIDER_SITE_OTHER): Payer: BC Managed Care – PPO | Admitting: Surgery

## 2021-02-26 ENCOUNTER — Encounter: Payer: Self-pay | Admitting: Surgery

## 2021-02-26 ENCOUNTER — Other Ambulatory Visit: Payer: Self-pay

## 2021-02-26 VITALS — BP 126/78 | HR 65 | Temp 98.0°F | Ht 62.0 in | Wt 151.2 lb

## 2021-02-26 DIAGNOSIS — Z09 Encounter for follow-up examination after completed treatment for conditions other than malignant neoplasm: Secondary | ICD-10-CM

## 2021-02-26 DIAGNOSIS — N611 Abscess of the breast and nipple: Secondary | ICD-10-CM

## 2021-02-26 NOTE — Patient Instructions (Addendum)
Please call our office if you have any questions or concerns. Please be sure to follow up with screening mammogram with PCP in June 2023.

## 2021-02-26 NOTE — Progress Notes (Signed)
02/26/2021  HPI: Adriana Berg is a 66 y.o. female s/p right breast abscess excision on 01/28/21.  Patient presents today for follow up.  She's doing very well today and denies any further pain, drainage, or other issues with the right breast.  Healing well overall  Vital signs: BP 126/78    Pulse 65    Temp 98 F (36.7 C) (Oral)    Ht 5\' 2"  (1.575 m)    Wt 151 lb 3.2 oz (68.6 kg)    SpO2 98%    BMI 27.65 kg/m    Physical Exam: Constitutional:  No acute distress Breast:  Right breast periareolar incision in upper outer quadrant is healing well.  Previous area that was open has a scab that is peeling, with intact wound beneath it.  No pain with palpation, no drainage.  Assessment/Plan: This is a 67 y.o. female s/p excision of right breast abscess.  --Patient is healing well and recovering.  The area that had previoulsy opened has been sealing appropriately and the scab is trying to peel off.  No drainage, no evidence of infection. --Discussed with patient that she'll still need to continue having yearly mammograms and breast exams which can be coordinated with her PCP.  She is due to bilateral screening mammogram in June 2023. --Follow up as needed.   Melvyn Neth, [facility]

## 2021-03-04 ENCOUNTER — Telehealth: Payer: Self-pay | Admitting: *Deleted

## 2021-03-04 NOTE — Telephone Encounter (Signed)
Faxed Disability Insuracne Claim Form to [employer] at [phone]

## 2021-03-17 ENCOUNTER — Other Ambulatory Visit: Payer: Self-pay

## 2021-03-17 ENCOUNTER — Encounter: Payer: Self-pay | Admitting: Surgery

## 2021-03-17 ENCOUNTER — Ambulatory Visit (INDEPENDENT_AMBULATORY_CARE_PROVIDER_SITE_OTHER): Payer: BC Managed Care – PPO | Admitting: Surgery

## 2021-03-17 VITALS — BP 156/85 | HR 82 | Temp 98.2°F | Ht 62.0 in | Wt 156.6 lb

## 2021-03-17 DIAGNOSIS — N611 Abscess of the breast and nipple: Secondary | ICD-10-CM

## 2021-03-17 DIAGNOSIS — Z09 Encounter for follow-up examination after completed treatment for conditions other than malignant neoplasm: Secondary | ICD-10-CM

## 2021-03-17 NOTE — Progress Notes (Signed)
03/17/2021 ? ?HPI: ?Adriana Berg is a 66 y.o. female s/p excision of right breast abscess and mass on 01/28/2021.  Patient called this morning because she started having NH from the right breast wound.  The patient reports that the scab fell off and she started having fluid dripping out.  Denies any worsening pain, fevers, chills.  Reports that the fluid has been clear/yellow in color and no pus. ? ?Vital signs: ?BP (!) 156/85   Pulse 82   Temp 98.2 ?F (36.8 ?C) (Oral)   Ht '5\' 2"'$  (1.575 m)   Wt 156 lb 9.6 oz (71 kg)   SpO2 98%   BMI 28.64 kg/m?   ? ?Physical Exam: ?Constitutional: No acute distress ?Breast: Right breast status post excision of her prior recurrent breast abscess cavity.  The wound has an opening of about 1 cm diameter where the prior scab had been located.  With this falling off, this reveals a cavity of her excision which measures about 1.5 cm in depth.  The wound base and edges appear healthy without any purulence or necrotic tissue.  There is no erythema to suggest infection.  The wound was packed with quarter inch iodoform gauze and dressed with dry gauze and tape. ? ?Assessment/Plan: ?This is a 66 y.o. female s/p excision of right breast abscess and calcifications on 01/28/2021. ? ?- Discussed with the patient that appears a scab after it fell off revealed an open cavity from her excision.  This does not appear infected and reassured the patient that although it is an open wound, currently we do not need other procedures or antibiotics.  However she will require packing dressing changes and instructed her how to do the packing dressing change once daily with quarter inch iodoform gauze.  The patient reports that she has had to do this to a different area of her body in the past and is more familiar with this dressing. ?- She will follow-up with me in 3 weeks to reassess her progress. ? ? ?Melvyn Neth, MD ?[facility]  ?

## 2021-03-17 NOTE — Patient Instructions (Signed)
Remove the packing material while in the shower. Wash the wound with soap and water. Rinse well. Place new packing material into the wound using the tweezers or a Q-tip. Place a dry dressing over the area and secure with tape.You will need to do this once daily. I will call you later today with your appointment information. ?

## 2021-03-31 ENCOUNTER — Telehealth: Payer: Self-pay | Admitting: Surgery

## 2021-03-31 NOTE — Telephone Encounter (Signed)
Patient with right breast abscess done 02/26/21 in office wit Dr. Christian Mate.  She is out of packing material for the wound, says we gave to her in a brown bottle and she can't find any supplies anywhere.  Not sure if we have something here we can help her with.  Please call her, thank you.  ?

## 2021-03-31 NOTE — Telephone Encounter (Signed)
Spoke with patient-gave supplies provided to patient to pack wound.  ?

## 2021-04-07 ENCOUNTER — Ambulatory Visit (INDEPENDENT_AMBULATORY_CARE_PROVIDER_SITE_OTHER): Payer: BC Managed Care – PPO | Admitting: Surgery

## 2021-04-07 ENCOUNTER — Encounter: Payer: Self-pay | Admitting: Surgery

## 2021-04-07 VITALS — BP 134/71 | HR 91 | Temp 98.3°F | Ht 62.0 in | Wt 150.6 lb

## 2021-04-07 DIAGNOSIS — Z09 Encounter for follow-up examination after completed treatment for conditions other than malignant neoplasm: Secondary | ICD-10-CM

## 2021-04-07 DIAGNOSIS — N611 Abscess of the breast and nipple: Secondary | ICD-10-CM

## 2021-04-07 NOTE — Patient Instructions (Signed)
Change the packing once daily. Please see your follow up appointment listed below.  ?

## 2021-04-07 NOTE — Progress Notes (Signed)
04/07/2021 ? ?HPI: ?Adriana Berg is a 66 y.o. female s/p excision of right breast abscess on 01/28/21.  She been following up due to a wound dehiscence and has been doing packing dressing changes.  She called on 03/31/21 because she ran out of packing material and got a new bottle of 1/4 inch iodoform.  Today, she reports that she's doing well, but does not think that the wound is getting any smaller.  She is doing dressing changes twice daily but still notices drainage from the wound and feels that she's using a significant length of gauze. ? ?Vital signs: ?BP 134/71   Pulse 91   Temp 98.3 ?F (36.8 ?C) (Oral)   Ht '5\' 2"'$  (1.575 m)   Wt 150 lb 9.6 oz (68.3 kg)   SpO2 97%   BMI 27.55 kg/m?   ? ?Physical Exam: ?Constitutional:  No acute distress ?Breast:  Right breast with right breast wound clean, dry, with healthy granulation tissue, but has remained of similar size compared to her last visit 3 weeks ago.  Removed her packing gauze and compared it to the length of my gauze dressing and it's similar length, so do not think she's overpacking. ? ?Assessment/Plan: ?This is a 66 y.o. female s/p excision of right breast abscess. ? ?--The wound is healthy without any infection or fibrinous material, but the wound has not improved in size.  Discussed with the patient that perhaps could be a result of too frequent packing dressing changes.  Discussed also not overpacking or packing too tightly, but does not appear to be doing that based on length of gauze used by her and me.   ?--Recommended that she only do packing dressing change once daily, but change the outer gauze as needed.   ?--She will follow up in 3 weeks.  If there's been no improvement, then will refer her to wound clinic for further evaluation.  She's in agreement with this plan. ? ? ?Melvyn Neth, MD ?[facility]  ?

## 2021-04-16 ENCOUNTER — Telehealth: Payer: Self-pay | Admitting: *Deleted

## 2021-04-16 NOTE — Telephone Encounter (Signed)
LMTC to schedule Yearly Lung CA CT Scan. 

## 2021-04-17 ENCOUNTER — Other Ambulatory Visit: Payer: Self-pay

## 2021-04-17 DIAGNOSIS — Z122 Encounter for screening for malignant neoplasm of respiratory organs: Secondary | ICD-10-CM

## 2021-04-17 DIAGNOSIS — F17210 Nicotine dependence, cigarettes, uncomplicated: Secondary | ICD-10-CM

## 2021-04-17 DIAGNOSIS — Z87891 Personal history of nicotine dependence: Secondary | ICD-10-CM

## 2021-04-23 ENCOUNTER — Ambulatory Visit (INDEPENDENT_AMBULATORY_CARE_PROVIDER_SITE_OTHER): Payer: BC Managed Care – PPO | Admitting: Surgery

## 2021-04-23 ENCOUNTER — Encounter: Payer: Self-pay | Admitting: Surgery

## 2021-04-23 VITALS — BP 161/77 | HR 77 | Temp 98.1°F | Ht 62.0 in | Wt 150.8 lb

## 2021-04-23 DIAGNOSIS — N611 Abscess of the breast and nipple: Secondary | ICD-10-CM

## 2021-04-23 DIAGNOSIS — Z09 Encounter for follow-up examination after completed treatment for conditions other than malignant neoplasm: Secondary | ICD-10-CM

## 2021-04-23 NOTE — Progress Notes (Signed)
04/23/2021 ? ?HPI: ?Adriana Berg is a 66 y.o. female s/p excision of right breast abscess and calcifications on 01/28/2021.  The wound had a dehiscence last month and she has been doing packing dressing changes.  She was last seen on 04/07/2021 at which time the wound remained the same size and it was noted that the amount of gauze used was a significant length, but she was doing dressing changes multiple times a day.  However the wound edges and base were healthy.  Discussed with her to only do packing changes once daily.  She presents today for follow-up.  She reports that now the wound is healing well and is getting smaller in size.  She reports also decreased drainage and denies any pain. ? ?Vital signs: ?BP (!) 161/77   Pulse 77   Temp 98.1 ?F (36.7 ?C) (Oral)   Ht '5\' 2"'$  (1.575 m)   Wt 150 lb 12.8 oz (68.4 kg)   SpO2 98%   BMI 27.58 kg/m?   ? ?Physical Exam: ?Constitutional: No acute distress ?Breast: Right breast status post excision of her abscess cavity and calcifications.  There is an 8 mm wound, with depth of about 1 cm.  The wound edges and base are very healthy with good granulation tissue.  Packed/dressed with gauze. ? ?Assessment/Plan: ?This is a 66 y.o. female s/p excision of right breast abscess and calcifications with subsequent partial wound dehiscence. ? ?- Discussed with patient that her wound is healing well and is actually smaller than it was on her last visit.  She is using less length of the quarter inch gauze compared to before.  She can continue with her dressing changes as currently doing. ?- Give the patient the option for follow-up appointment in about 3 more weeks to check on her wound versus as needed depending how she feels everything is doing.  She has opted for follow-up as needed.  Return precautions given.  She can contact us if there is any concerns with the wound healing drainage or other concerns.  Discussed with her that once the cavity is shallow enough that any gauze that  she tries to put in.  Right away, she can switch to only a dry gauze dressing on the outside. ?- Follow-up as needed. ? ? ?Melvyn Neth, MD ?[facility]  ?

## 2021-04-23 NOTE — Patient Instructions (Signed)
If you have any concerns or questions, please feel free to call our office. Follow up as needed.  ? ?Mastitis ? ?Mastitis is irritation and swelling (inflammation) in an area of the breast. This most often happens in women who are breastfeeding, but it can happen to other women too, as well as some men. A doctor will help decide if treatment is needed. ?What are the causes? ?This condition is caused by: ?Germs (bacteria). This can happen when germs enter the breast through cuts or openings in the skin. ?A plugged milk duct. This happens when something blocks the flow of milk in the breast. ?Nipple piercing. The pierced area can allow germs to enter the breast. ?Some types of breast cancer. ?What are the signs or symptoms? ?Swelling, redness, and pain in the breast. ?Swelling of the glands under the arm. ?Fluid flowing from the nipple. ?Feeling very tired. ?Headache and body aches. ?Fever and chills. ?Vomiting or feeling like you may vomit. ?Fast heart rate. ?Symptoms often last 2-5 days. The pain and redness can be the worst on days 2 and 3. This will often go away by day 5. If the infection is not treated, pus or a pocket of fluid may form under the skin (abscess). ?How is this diagnosed? ?This condition can usually be diagnosed based on a physical exam and your symptoms. You may also have other tests, such as: ?Blood tests to check if your body is fighting infection. ?X-rays or ultrasounds of the breast. ?Fluid tests. If a pocket of fluid has formed, the fluid may be taken out with a needle. The fluid may be checked to see whether germs are present. ?Breast milk testing. A sample of breast milk may be tested for germs. This is done only when breastfeeding or pumping. ?How is this treated? ?Mastitis will sometimes go away on its own, so your doctor may choose to wait 24 hours after first seeing you to decide if medicine is needed. This condition may be treated with: ?Continuing to breastfeed or pump from both  breasts to allow milk flow and prevent a pocket of fluid from forming. ?Using hot or cold compresses. ?Taking medicine for pain. ?Taking antibiotic medicine. ?Rest. ?Drinking plenty of fluids. ?Taking out fluid with a needle, if a pocket of fluid has formed. ?Follow these instructions at home: ?Breast care ? ?Keep your nipples clean and dry. ?If told, put heat on the affected area of your breast. Use the heat source that your doctor or lactation specialist tells you to use. ?If told, put ice on the affected area of your breast. To do this: ?Put ice in a plastic bag. ?Place a towel between your skin and the bag. ?Leave the ice on for 20 minutes, 2-3 times a day. ?Take off the ice if your skin turns bright red. This is very important. If you cannot feel pain, heat, or cold, you have a greater risk of damage to the area. ?Breastfeeding and pumping tips ?Continue to breastfeed your baby on demand. This means feeding your baby whenever he or she is hungry. ?Ask your doctor or lactation specialist whether you need to change your breastfeeding routine. ?Avoid using nipple shields, if possible. Ask a lactation specialist for help. ?Change the breast you offer first at each feeding to make sure your baby feeds from both breasts. ?Offer both breasts to your baby every time your baby feeds. ?Use gentle breast massage during feeding or pumping sessions only as told by your doctor or lactation specialist. ?Avoid  letting your breasts get very full with milk (engorged). If your breasts are very full, you can hand express a small amount of milk for comfort. ?If you are pumping, keep pumping on the same schedule as you were before. In the breast with mastitis, pump until very little milk is coming out. Do not empty your breast. Emptying your breast causes your body to make more milk and can make symptoms worse. ?Ask your doctor or lactation specialist whether you need to change your pumping routine. ?Medicines ?Take over-the-counter  and prescription medicines only as told by your doctor. ?If you were prescribed an antibiotic medicine, take it as told by your doctor. Do not stop taking it even if you start to feel better. ?Contact a doctor if: ?You have pus-like fluid leaking from your breast. ?You have a fever. ?Your symptoms do not get better within 2 days of starting treatment. ?Get help right away if: ?Your pain and swelling are getting worse. ?Your pain is not helped by medicine. ?You have a red line going from your breast toward your armpit. ?Summary ?Mastitis is irritation and swelling in an area of the breast. ?Mastitis will sometimes go away on its own. ?Get plenty of rest. ?Contact a doctor if your symptoms do not get better within 2 days. ?If you were prescribed an antibiotic medicine, do not stop taking it even if you start to feel better. ?This information is not intended to replace advice given to you by your health care provider. Make sure you discuss any questions you have with your health care provider. ?Document Revised: 01/24/2021 Document Reviewed: 10/23/2019 ?Elsevier Patient Education ? [place]. ? ?

## 2021-04-29 ENCOUNTER — Ambulatory Visit
Admission: RE | Admit: 2021-04-29 | Discharge: 2021-04-29 | Disposition: A | Discharge status: Home / Self Care | Payer: BC Managed Care – PPO | Source: Ambulatory Visit | Attending: Acute Care | Admitting: Acute Care

## 2021-04-29 DIAGNOSIS — Z122 Encounter for screening for malignant neoplasm of respiratory organs: Secondary | ICD-10-CM | POA: Insufficient documentation

## 2021-04-29 DIAGNOSIS — Z87891 Personal history of nicotine dependence: Secondary | ICD-10-CM | POA: Insufficient documentation

## 2021-04-29 DIAGNOSIS — F17210 Nicotine dependence, cigarettes, uncomplicated: Secondary | ICD-10-CM | POA: Insufficient documentation

## 2021-04-30 ENCOUNTER — Other Ambulatory Visit: Payer: Self-pay

## 2021-04-30 DIAGNOSIS — F17210 Nicotine dependence, cigarettes, uncomplicated: Secondary | ICD-10-CM

## 2021-04-30 DIAGNOSIS — Z87891 Personal history of nicotine dependence: Secondary | ICD-10-CM

## 2021-04-30 DIAGNOSIS — Z122 Encounter for screening for malignant neoplasm of respiratory organs: Secondary | ICD-10-CM

## 2021-07-03 ENCOUNTER — Other Ambulatory Visit: Payer: Self-pay | Admitting: Family Medicine

## 2021-07-03 DIAGNOSIS — Z1231 Encounter for screening mammogram for malignant neoplasm of breast: Secondary | ICD-10-CM

## 2021-08-04 ENCOUNTER — Ambulatory Visit
Admission: RE | Admit: 2021-08-04 | Discharge: 2021-08-04 | Disposition: A | Discharge status: Home / Self Care | Payer: 59 | Source: Ambulatory Visit | Attending: Family Medicine | Admitting: Family Medicine

## 2021-08-04 DIAGNOSIS — Z1231 Encounter for screening mammogram for malignant neoplasm of breast: Secondary | ICD-10-CM | POA: Diagnosis not present

## 2021-08-12 ENCOUNTER — Other Ambulatory Visit: Payer: Self-pay | Admitting: Family Medicine

## 2021-08-12 DIAGNOSIS — R928 Other abnormal and inconclusive findings on diagnostic imaging of breast: Secondary | ICD-10-CM

## 2021-08-12 DIAGNOSIS — N6489 Other specified disorders of breast: Secondary | ICD-10-CM

## 2021-08-28 ENCOUNTER — Ambulatory Visit
Admission: RE | Admit: 2021-08-28 | Discharge: 2021-08-28 | Disposition: A | Discharge status: Home / Self Care | Payer: 59 | Source: Ambulatory Visit | Attending: Family Medicine | Admitting: Family Medicine

## 2021-08-28 DIAGNOSIS — N6489 Other specified disorders of breast: Secondary | ICD-10-CM | POA: Diagnosis present

## 2021-08-28 DIAGNOSIS — R928 Other abnormal and inconclusive findings on diagnostic imaging of breast: Secondary | ICD-10-CM

## 2021-09-04 ENCOUNTER — Other Ambulatory Visit: Payer: Self-pay | Admitting: Family Medicine

## 2021-09-04 DIAGNOSIS — R928 Other abnormal and inconclusive findings on diagnostic imaging of breast: Secondary | ICD-10-CM

## 2021-09-04 DIAGNOSIS — N63 Unspecified lump in unspecified breast: Secondary | ICD-10-CM

## 2021-09-17 ENCOUNTER — Ambulatory Visit
Admission: RE | Admit: 2021-09-17 | Discharge: 2021-09-17 | Disposition: A | Discharge status: Home / Self Care | Payer: No Typology Code available for payment source | Source: Ambulatory Visit | Attending: Family Medicine | Admitting: Family Medicine

## 2021-09-17 DIAGNOSIS — N63 Unspecified lump in unspecified breast: Secondary | ICD-10-CM

## 2021-09-17 DIAGNOSIS — R928 Other abnormal and inconclusive findings on diagnostic imaging of breast: Secondary | ICD-10-CM

## 2021-09-17 HISTORY — PX: BREAST BIOPSY: SHX20

## 2021-09-18 ENCOUNTER — Encounter: Payer: Self-pay | Admitting: *Deleted

## 2021-09-18 DIAGNOSIS — C50919 Malignant neoplasm of unspecified site of unspecified female breast: Secondary | ICD-10-CM

## 2021-09-18 NOTE — Progress Notes (Unsigned)
Received referral for newly diagnosed breast cancer from [facility].  Navigation initiated.  Med Onc and Surgical consults scheduled. She will see Dr. Hampton Abbot 09/24/21 at 8:45 and Dr. Tasia Catchings on 9/22 at 9:30.

## 2021-09-19 LAB — SURGICAL PATHOLOGY

## 2021-09-24 ENCOUNTER — Other Ambulatory Visit: Payer: Self-pay

## 2021-09-24 ENCOUNTER — Encounter: Payer: Self-pay | Admitting: Surgery

## 2021-09-24 ENCOUNTER — Ambulatory Visit (INDEPENDENT_AMBULATORY_CARE_PROVIDER_SITE_OTHER): Payer: 59 | Admitting: Surgery

## 2021-09-24 VITALS — BP 173/81 | HR 84 | Temp 98.1°F | Ht 62.0 in | Wt 151.0 lb

## 2021-09-24 DIAGNOSIS — C50412 Malignant neoplasm of upper-outer quadrant of left female breast: Secondary | ICD-10-CM

## 2021-09-24 DIAGNOSIS — Z170 Estrogen receptor positive status [ER+]: Secondary | ICD-10-CM

## 2021-09-24 MED ORDER — LIDOCAINE-PRILOCAINE 2.5-2.5 % EX CREA: TOPICAL_CREAM | CUTANEOUS | 0 refills | Status: DC

## 2021-09-24 NOTE — Progress Notes (Signed)
09/24/2021  History of Present Illness: Adriana Berg is a 66 y.o. female presenting for evaluation of left breast cancer.  She's known to our practice and in the past had a right breast lumpectomy and SLNBx in 2007 with Dr. Jamal Collin, followed by radiation.  I have seen her for a right breast abscess in 2022 and for excision of this abscess cavity on 01/28/21.  Her last screening mammogram on 07/01/20 was negative for any suspicious findings.  She had her screening mammogram again on 08/04/21 which showed a possible asymmetry in the right breast and architectural distortion in the left breast.  Diagnostic mammogram and ultrasound on 08/28/21 showed normal tissue on the right breast, but did confirm an irregular spiculated mass at 2 o'clock, 5 cm from nipple.  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  The patient today reports she's still processing all this new diagnosis.  She does have a family history of breast cancer with her sister and as mentioned, personal history of prior right breast cancer.  She denies any palpable masses, skin changes, or nipple changes.  She does report some bruising from the biopsy itself.  Past Medical History: Past Medical History:  Diagnosis Date   Borderline diabetes    Breast cancer ([place]) 2007   RT LUMPECTOMY, ER +   Lab test positive for detection of COVID-19 virus 12/2018   MRSA (methicillin resistant staph aureus) culture positive 06/2015   lower abdomen/mons pubis abscess   Personal history of malignant neoplasm of breast    invasive ductal carcinoma with tubular features,Stage 1,ER pos,HER2 neg   Personal history of radiation therapy 2007   Personal history of tobacco use, presenting hazards to health    Pneumonia 02/2015   Radiation 2007   BREAST CA   Unspecified essential hypertension      Past Surgical History: Past Surgical History:  Procedure Laterality Date    ABDOMINAL HYSTERECTOMY  1995   AXILLARY SENTINEL NODE BIOPSY Right 2007   BREAST BIOPSY Right 2007   CORE - POS   BREAST BIOPSY Right 1995   EXCISIONAL - NEG   BREAST BIOPSY Left 09/17/2021   u/s bx, heart clip. 2:00, path pending.   BREAST LUMPECTOMY Right 2007   breast ca   BREAST SURGERY  2007   right lumpectomy   COLONOSCOPY  12/27/2012   COLONOSCOPY WITH PROPOFOL N/A 02/14/2019   Procedure: COLONOSCOPY WITH PROPOFOL;  Surgeon: Jonathon Bellows, MD;  Location: [facility] ENDOSCOPY;  Service: Gastroenterology;  Laterality: N/A;   EXCISION OF BREAST BIOPSY Right 01/28/2021   Procedure: EXCISION OF BREAST abscess;  Surgeon: Olean Ree, MD;  Location: [facility] ORS;  Service: General;  Laterality: Right;    Home Medications: Prior to Admission medications   Medication Sig Start Date End Date Taking? Authorizing Provider  amLODipine (NORVASC) 2.5 MG tablet Take 2.5 mg by mouth daily.   Yes [provider]  aspirin EC 81 MG tablet Take 81 mg by mouth daily. Swallow whole.   Yes [provider]  cholecalciferol (VITAMIN D) 25 MCG (1000 UNIT) tablet Take 1,000 Units by mouth daily. 11/14/20  Yes [provider]  lidocaine-prilocaine (EMLA) cream Apply to the left areola and cover with plastic wrap one hour prior to leaving for surgery. 09/24/21  Yes Heather Streeper, Jacqulyn Bath, MD  lisinopril (ZESTRIL) 40 MG tablet Take 40 mg by mouth daily. 01/23/21  Yes [provider]  spironolactone (ALDACTONE) 50 MG tablet Take 50  mg by mouth daily.  06/17/17  Yes [provider]  VITAMIN D PO Take 1,000 Units by mouth daily.   Yes [provider]  albuterol (PROVENTIL) (2.5 MG/3ML) 0.083% nebulizer solution Take 3 mLs (2.5 mg total) by nebulization every 6 (six) hours as needed for wheezing or shortness of breath. 01/19/17 12/18/18  Laban Emperor, PA-C    Allergies: Allergies  Allergen Reactions   Azithromycin Swelling    Review of Systems: Review of Systems   Constitutional:  Negative for chills and fever.  HENT:  Negative for hearing loss.   Respiratory:  Negative for shortness of breath.   Cardiovascular:  Negative for chest pain.  Gastrointestinal:  Negative for abdominal pain, nausea and vomiting.  Genitourinary:  Negative for dysuria.  Musculoskeletal:  Negative for myalgias.  Skin:  Negative for rash.  Neurological:  Negative for dizziness.  Psychiatric/Behavioral:  Negative for depression.     Physical Exam BP (!) 173/81   Pulse 84   Temp 98.1 F (36.7 C) (Oral)   Ht '5\' 2"'  (1.575 m)   Wt 151 lb (68.5 kg)   SpO2 98%   BMI 27.62 kg/m  CONSTITUTIONAL: No acute distress, well nourished. HEENT:  Normocephalic, atraumatic, extraocular motion intact. RESPIRATORY:  Lungs are clear, and breath sounds are equal bilaterally. Normal respiratory effort without pathologic use of accessory muscles. CARDIOVASCULAR: Heart is regular without murmurs, gallops, or rubs. BREAST:  Right breast s/p abscess cavity excision, with a small 1 cm area where scab had formed, but the tissue had not filled in yet.  The scab was scraped off, revealing healthy tissue, no abscess or purulence.  Left breast s/p biopsy with some mild ecchymosis.  Otherwise no skin changes, palpable masses, or nipple changes.  No left axillary lymphadenopathy. MUSCULOSKELETAL:  Normal gait, no peripheral edema. NEUROLOGIC:  Motor and sensation is grossly normal.  Cranial nerves are grossly intact. PSYCH:  Alert and oriented to person, place and time. Affect is normal.  Labs/Imaging: Mammogram + U/S 08/28/21: IMPRESSION: 1. There is a suspicious 6 mm mass in the LEFT breast at 2 o'clock. Recommend ultrasound-guided biopsy for definitive characterization. Recommend attention on post marker placement mammogram to assess for mammographic/sonographic correlation. 2. No suspicious LEFT axillary adenopathy. 3. No mammographic evidence of malignancy in the RIGHT breast.    RECOMMENDATION: LEFT breast ultrasound-guided biopsy x1   I have discussed the findings and recommendations with the patient. The biopsy procedure was discussed with the patient and questions were answered. Patient expressed their understanding of the biopsy recommendation. Patient will be scheduled for biopsy at her earliest convenience by the schedulers. Ordering provider will be notified. If applicable, a reminder letter will be sent to the patient regarding the next appointment.   BI-RADS CATEGORY  4: Suspicious.   Left breast biopsy on 09/17/21: DIAGNOSIS:  BREAST, LEFT, MASS; CORE BIOPSIES:  - INVASIVE MAMMARY CARCINOMA, NO SPECIAL TYPE.  Size of invasive carcinoma: 5 mm in this sample  Histologic grade of invasive carcinoma: Grade 1                       Glandular/tubular differentiation score: 2 (of 3)                       Nuclear pleomorphism score: 2 (of 3)                       Mitotic rate score: 1 (of 3)  Total score: 5 (of 9)  Ductal carcinoma in situ: Present  Lymphovascular invasion: Not identified Estrogen Receptor (ER) Status: POSITIVE          Percentage of cells with nuclear positivity: Greater than 90%          Average intensity of staining: Moderate   Progesterone Receptor (PgR) Status: POSITIVE          Percentage of cells with nuclear positivity: Greater than 90%          Average intensity of staining: Strong   HER2 (by immunohistochemistry): NEGATIVE (Score 1+)  Ki-67: Not performed   Assessment and Plan: This is a 66 y.o. female with newly diagnosed left breast cancer.  --Discussed with the patient the findings on the mammogram and biopsy results.  She has new diagnosis of cancer on the left breast.  Discussed with her the steps in the process from going back to [facility] for tag localization before surgery, day of surgery for nuclear medicine tracer injection, and the surgery itself.  Discussed why the mass needs to be localized  due to small size and for more accurate specimen removal.  Discussed the need for a sentinel lymph node biopsy to help with staging and determine possibility of treatments needed.   --Discussed with her the plan for a tag-localized left breast lumpectomy and left axillary sentinel lymph node biopsy and reviewed the surgery at length with her including the incisions, the risks of bleeding, infection, injury to surrounding structures, and lymphedema, that this is an outpatient surgery, post-operative activity restrictions, pain control, breast binder use, and she's willing to proceed. --Will schedule her for surgery on 10/10/21.  Last dose of Aspirin would be on 10/04/21.  All of her questions have been answered.  I spent 40 minutes dedicated to the care of this patient on the date of this encounter to include pre-visit review of records, face-to-face time with the patient discussing diagnosis and management, and any post-visit coordination of care.   Melvyn Neth, [facility]

## 2021-09-24 NOTE — Patient Instructions (Addendum)
What is radio frequency localization of the breast?(RFID) RFID tag localization uses radiofrequency technology to accurately pinpoint the tumor. Seeing exactly where the tumor is before surgery helps surgeons more effectively remove the entire tumor and spare surrounding healthy breast tissue. This will be done before your surgery at [facility].  We have spoken today about removing a lump in your breast. This will be done by Dr. Hampton Abbot at [facility]. You will need to stop your Aspirin 5 days prior to surgery.  You will most likely be able to leave the hospital several hours after your surgery. Rarely, a patient needs to stay over night but this is a possibility.  Plan to tenatively be off work for 1-2 weeks following the surgery and may return with approximately 4 more weeks of a lifting restriction, no greater than 15 lbs.  Please see your Blue surgery sheet for more information. Our surgery scheduler will call you to look at surgery dates and to go over information.     Lumpectomy A lumpectomy is a form of "breast conserving" or "breast preservation" surgery. It may also be referred to as a partial mastectomy. During a lumpectomy, the portion of the breast that contains the cancerous tumor or breast mass (the lump) is removed. Some normal tissue around the lump may also be removed to make sure all of the tumor has been removed.  LET [employer] CARE PROVIDER KNOW ABOUT: Any allergies you have. All medicines you are taking, including vitamins, herbs, eye drops, creams, and over-the-counter medicines. Previous problems you or members of your family have had with the use of anesthetics. Any blood disorders you have. Previous surgeries you have had. Medical conditions you have. RISKS AND COMPLICATIONS Generally, this is a safe procedure. However, problems can occur and include: Bleeding. Infection. Pain. Temporary swelling. Change in the shape of the breast, particularly if a  large portion is removed. BEFORE THE PROCEDURE Ask your health care provider about changing or stopping your regular medicines. This is especially important if you are taking diabetes medicines or blood thinners. Do not eat or drink anything after midnight on the night before the procedure or as directed by your health care provider. Ask your health care provider if you can take a sip of water with any approved medicines. On the day of surgery, your health care provider will use a mammogram or ultrasound to locate and mark the tumor in your breast. These markings on your breast will show where the cut (incision) will be made. PROCEDURE  An IV tube will be put into one of your veins. You may be given medicine to help you relax before the surgery (sedative). You will be given one of the following: A medicine that numbs the area (local anesthetic). A medicine that makes you fall asleep (general anesthetic). Your health care provider will use a kind of electric scalpel that uses heat to minimize bleeding (electrocautery knife). A curved incision (like a smile or frown) that follows the natural curve of your breast is made, to allow for minimal scarring and better healing. The tumor will be removed with some of the surrounding tissue. This will be sent to the lab for analysis. Your health care provider may also remove your lymph nodes at this time if needed. Sometimes, but not always, a rubber tube called a drain will be surgically inserted into your breast area or armpit to collect excess fluid that may accumulate in the space where the tumor was. This drain  is connected to a plastic bulb on the outside of your body. This drain creates suction to help remove the fluid. The incisions will be closed with stitches (sutures). A bandage may be placed over the incisions. AFTER THE PROCEDURE You will be taken to the recovery area. You will be given medicine for pain. A small rubber drain may be placed in the  breast for 2-3 days to prevent a collection of blood (hematoma) from developing in the breast. You will be given instructions on caring for the drain before you go home. A pressure bandage (dressing) will be applied for 1-2 days to prevent bleeding. Ask your health care provider how to care for your bandage at home.   This information is not intended to replace advice given to you by your health care provider. Make sure you discuss any questions you have with your health care provider.   Document Released: 02/02/2006 Document Revised: 01/12/2014 Document Reviewed: 05/27/2012 Elsevier Interactive Patient Education [employer].

## 2021-09-24 NOTE — H&P (View-Only) (Signed)
09/24/2021  History of Present Illness: Adriana Berg is a 66 y.o. female presenting for evaluation of left breast cancer.  She's known to our practice and in the past had a right breast lumpectomy and SLNBx in 2007 with Dr. Jamal Collin, followed by radiation.  I have seen her for a right breast abscess in 2022 and for excision of this abscess cavity on 01/28/21.  Her last screening mammogram on 07/01/20 was negative for any suspicious findings.  She had her screening mammogram again on 08/04/21 which showed a possible asymmetry in the right breast and architectural distortion in the left breast.  Diagnostic mammogram and ultrasound on 08/28/21 showed normal tissue on the right breast, but did confirm an irregular spiculated mass at 2 o'clock, 5 cm from nipple.  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  The patient today reports she's still processing all this new diagnosis.  She does have a family history of breast cancer with her sister and as mentioned, personal history of prior right breast cancer.  She denies any palpable masses, skin changes, or nipple changes.  She does report some bruising from the biopsy itself.  Past Medical History: Past Medical History:  Diagnosis Date   Borderline diabetes    Breast cancer ([place]) 2007   RT LUMPECTOMY, ER +   Lab test positive for detection of COVID-19 virus 12/2018   MRSA (methicillin resistant staph aureus) culture positive 06/2015   lower abdomen/mons pubis abscess   Personal history of malignant neoplasm of breast    invasive ductal carcinoma with tubular features,Stage 1,ER pos,HER2 neg   Personal history of radiation therapy 2007   Personal history of tobacco use, presenting hazards to health    Pneumonia 02/2015   Radiation 2007   BREAST CA   Unspecified essential hypertension      Past Surgical History: Past Surgical History:  Procedure Laterality Date    ABDOMINAL HYSTERECTOMY  1995   AXILLARY SENTINEL NODE BIOPSY Right 2007   BREAST BIOPSY Right 2007   CORE - POS   BREAST BIOPSY Right 1995   EXCISIONAL - NEG   BREAST BIOPSY Left 09/17/2021   u/s bx, heart clip. 2:00, path pending.   BREAST LUMPECTOMY Right 2007   breast ca   BREAST SURGERY  2007   right lumpectomy   COLONOSCOPY  12/27/2012   COLONOSCOPY WITH PROPOFOL N/A 02/14/2019   Procedure: COLONOSCOPY WITH PROPOFOL;  Surgeon: Jonathon Bellows, MD;  Location: [facility] ENDOSCOPY;  Service: Gastroenterology;  Laterality: N/A;   EXCISION OF BREAST BIOPSY Right 01/28/2021   Procedure: EXCISION OF BREAST abscess;  Surgeon: Olean Ree, MD;  Location: [facility] ORS;  Service: General;  Laterality: Right;    Home Medications: Prior to Admission medications   Medication Sig Start Date End Date Taking? Authorizing Provider  amLODipine (NORVASC) 2.5 MG tablet Take 2.5 mg by mouth daily.   Yes [provider]  aspirin EC 81 MG tablet Take 81 mg by mouth daily. Swallow whole.   Yes [provider]  cholecalciferol (VITAMIN D) 25 MCG (1000 UNIT) tablet Take 1,000 Units by mouth daily. 11/14/20  Yes [provider]  lidocaine-prilocaine (EMLA) cream Apply to the left areola and cover with plastic wrap one hour prior to leaving for surgery. 09/24/21  Yes Icie Kuznicki, Jacqulyn Bath, MD  lisinopril (ZESTRIL) 40 MG tablet Take 40 mg by mouth daily. 01/23/21  Yes [provider]  spironolactone (ALDACTONE) 50 MG tablet Take 50  mg by mouth daily.  06/17/17  Yes [provider]  VITAMIN D PO Take 1,000 Units by mouth daily.   Yes [provider]  albuterol (PROVENTIL) (2.5 MG/3ML) 0.083% nebulizer solution Take 3 mLs (2.5 mg total) by nebulization every 6 (six) hours as needed for wheezing or shortness of breath. 01/19/17 12/18/18  Laban Emperor, PA-C    Allergies: Allergies  Allergen Reactions   Azithromycin Swelling    Review of Systems: Review of Systems   Constitutional:  Negative for chills and fever.  HENT:  Negative for hearing loss.   Respiratory:  Negative for shortness of breath.   Cardiovascular:  Negative for chest pain.  Gastrointestinal:  Negative for abdominal pain, nausea and vomiting.  Genitourinary:  Negative for dysuria.  Musculoskeletal:  Negative for myalgias.  Skin:  Negative for rash.  Neurological:  Negative for dizziness.  Psychiatric/Behavioral:  Negative for depression.     Physical Exam BP (!) 173/81   Pulse 84   Temp 98.1 F (36.7 C) (Oral)   Ht '5\' 2"'  (1.575 m)   Wt 151 lb (68.5 kg)   SpO2 98%   BMI 27.62 kg/m  CONSTITUTIONAL: No acute distress, well nourished. HEENT:  Normocephalic, atraumatic, extraocular motion intact. RESPIRATORY:  Lungs are clear, and breath sounds are equal bilaterally. Normal respiratory effort without pathologic use of accessory muscles. CARDIOVASCULAR: Heart is regular without murmurs, gallops, or rubs. BREAST:  Right breast s/p abscess cavity excision, with a small 1 cm area where scab had formed, but the tissue had not filled in yet.  The scab was scraped off, revealing healthy tissue, no abscess or purulence.  Left breast s/p biopsy with some mild ecchymosis.  Otherwise no skin changes, palpable masses, or nipple changes.  No left axillary lymphadenopathy. MUSCULOSKELETAL:  Normal gait, no peripheral edema. NEUROLOGIC:  Motor and sensation is grossly normal.  Cranial nerves are grossly intact. PSYCH:  Alert and oriented to person, place and time. Affect is normal.  Labs/Imaging: Mammogram + U/S 08/28/21: IMPRESSION: 1. There is a suspicious 6 mm mass in the LEFT breast at 2 o'clock. Recommend ultrasound-guided biopsy for definitive characterization. Recommend attention on post marker placement mammogram to assess for mammographic/sonographic correlation. 2. No suspicious LEFT axillary adenopathy. 3. No mammographic evidence of malignancy in the RIGHT breast.    RECOMMENDATION: LEFT breast ultrasound-guided biopsy x1   I have discussed the findings and recommendations with the patient. The biopsy procedure was discussed with the patient and questions were answered. Patient expressed their understanding of the biopsy recommendation. Patient will be scheduled for biopsy at her earliest convenience by the schedulers. Ordering provider will be notified. If applicable, a reminder letter will be sent to the patient regarding the next appointment.   BI-RADS CATEGORY  4: Suspicious.   Left breast biopsy on 09/17/21: DIAGNOSIS:  BREAST, LEFT, MASS; CORE BIOPSIES:  - INVASIVE MAMMARY CARCINOMA, NO SPECIAL TYPE.  Size of invasive carcinoma: 5 mm in this sample  Histologic grade of invasive carcinoma: Grade 1                       Glandular/tubular differentiation score: 2 (of 3)                       Nuclear pleomorphism score: 2 (of 3)                       Mitotic rate score: 1 (of 3)  Total score: 5 (of 9)  Ductal carcinoma in situ: Present  Lymphovascular invasion: Not identified Estrogen Receptor (ER) Status: POSITIVE          Percentage of cells with nuclear positivity: Greater than 90%          Average intensity of staining: Moderate   Progesterone Receptor (PgR) Status: POSITIVE          Percentage of cells with nuclear positivity: Greater than 90%          Average intensity of staining: Strong   HER2 (by immunohistochemistry): NEGATIVE (Score 1+)  Ki-67: Not performed   Assessment and Plan: This is a 66 y.o. female with newly diagnosed left breast cancer.  --Discussed with the patient the findings on the mammogram and biopsy results.  She has new diagnosis of cancer on the left breast.  Discussed with her the steps in the process from going back to [facility] for tag localization before surgery, day of surgery for nuclear medicine tracer injection, and the surgery itself.  Discussed why the mass needs to be localized  due to small size and for more accurate specimen removal.  Discussed the need for a sentinel lymph node biopsy to help with staging and determine possibility of treatments needed.   --Discussed with her the plan for a tag-localized left breast lumpectomy and left axillary sentinel lymph node biopsy and reviewed the surgery at length with her including the incisions, the risks of bleeding, infection, injury to surrounding structures, and lymphedema, that this is an outpatient surgery, post-operative activity restrictions, pain control, breast binder use, and she's willing to proceed. --Will schedule her for surgery on 10/10/21.  Last dose of Aspirin would be on 10/04/21.  All of her questions have been answered.  I spent 40 minutes dedicated to the care of this patient on the date of this encounter to include pre-visit review of records, face-to-face time with the patient discussing diagnosis and management, and any post-visit coordination of care.   Melvyn Neth, [facility]

## 2021-09-25 ENCOUNTER — Other Ambulatory Visit: Payer: Self-pay | Admitting: Surgery

## 2021-09-25 ENCOUNTER — Telehealth: Payer: Self-pay | Admitting: Surgery

## 2021-09-25 DIAGNOSIS — R928 Other abnormal and inconclusive findings on diagnostic imaging of breast: Secondary | ICD-10-CM

## 2021-09-25 NOTE — Telephone Encounter (Signed)
Patient has been advised of Pre-Admission date/time, and Surgery date at [facility].  Surgery Date: 10/10/21 patient to arrive no later than 11:30 am at [facility] as she will be having SLN bx injection done first prior to her surgery.  Preadmission Testing Date: 10/06/21 (phone 8a-1p)  Patient reminded of her tag to be placed at the [facility] on 10/01/21 @ 3:40.  Patient verbalized understanding with the above.

## 2021-09-26 ENCOUNTER — Encounter: Payer: Self-pay | Admitting: *Deleted

## 2021-09-26 ENCOUNTER — Inpatient Hospital Stay: Payer: 59

## 2021-09-26 ENCOUNTER — Encounter: Payer: Self-pay | Admitting: Oncology

## 2021-09-26 ENCOUNTER — Inpatient Hospital Stay: Payer: 59 | Attending: Oncology | Admitting: Oncology

## 2021-09-26 VITALS — BP 141/80 | HR 65 | Temp 98.5°F | Resp 18 | Wt 152.6 lb

## 2021-09-26 DIAGNOSIS — C50919 Malignant neoplasm of unspecified site of unspecified female breast: Secondary | ICD-10-CM

## 2021-09-26 DIAGNOSIS — Z7189 Other specified counseling: Secondary | ICD-10-CM

## 2021-09-26 DIAGNOSIS — Z803 Family history of malignant neoplasm of breast: Secondary | ICD-10-CM

## 2021-09-26 DIAGNOSIS — C50912 Malignant neoplasm of unspecified site of left female breast: Secondary | ICD-10-CM

## 2021-09-26 DIAGNOSIS — Z853 Personal history of malignant neoplasm of breast: Secondary | ICD-10-CM | POA: Diagnosis present

## 2021-09-26 DIAGNOSIS — Z170 Estrogen receptor positive status [ER+]: Secondary | ICD-10-CM | POA: Diagnosis not present

## 2021-09-26 LAB — COMPREHENSIVE METABOLIC PANEL
ALT: 12 U/L (ref 0–44)
AST: 14 U/L — ABNORMAL LOW (ref 15–41)
Albumin: 4.1 g/dL (ref 3.5–5.0)
Alkaline Phosphatase: 56 U/L (ref 38–126)
Anion gap: 6 (ref 5–15)
BUN: 17 mg/dL (ref 8–23)
CO2: 29 mmol/L (ref 22–32)
Calcium: 9.3 mg/dL (ref 8.9–10.3)
Chloride: 105 mmol/L (ref 98–111)
Creatinine, Ser: 0.86 mg/dL (ref 0.44–1.00)
GFR, Estimated: >60 mL/min (ref >60)
Glucose, Bld: 100 mg/dL — ABNORMAL HIGH (ref 70–99)
Potassium: 4.3 mmol/L (ref 3.5–5.1)
Sodium: 140 mmol/L (ref 135–145)
Total Bilirubin: 0.5 mg/dL (ref 0.3–1.2)
Total Protein: 8.1 g/dL (ref 6.5–8.1)

## 2021-09-26 LAB — CBC WITH DIFFERENTIAL/PLATELET
Abs Immature Granulocytes: 0.01 10*3/uL (ref 0.00–0.07)
Basophils Absolute: 0 10*3/uL (ref 0.0–0.1)
Basophils Relative: 1 %
Eosinophils Absolute: 0.1 10*3/uL (ref 0.0–0.5)
Eosinophils Relative: 1 %
HCT: 44.7 % (ref 36.0–46.0)
Hemoglobin: 15.1 g/dL — ABNORMAL HIGH (ref 12.0–15.0)
Immature Granulocytes: 0 %
Lymphocytes Relative: 47 %
Lymphs Abs: 2.8 10*3/uL (ref 0.7–4.0)
MCH: 31.2 pg (ref 26.0–34.0)
MCHC: 33.8 g/dL (ref 30.0–36.0)
MCV: 92.4 fL (ref 80.0–100.0)
Monocytes Absolute: 0.4 10*3/uL (ref 0.1–1.0)
Monocytes Relative: 7 %
Neutro Abs: 2.7 10*3/uL (ref 1.7–7.7)
Neutrophils Relative %: 44 %
Platelets: 191 10*3/uL (ref 150–400)
RBC: 4.84 MIL/uL (ref 3.87–5.11)
RDW: 11.7 % (ref 11.5–15.5)
WBC: 6 10*3/uL (ref 4.0–10.5)
nRBC: 0 % (ref 0.0–0.2)

## 2021-09-26 NOTE — Assessment & Plan Note (Signed)
Discussed with patient

## 2021-09-26 NOTE — Assessment & Plan Note (Signed)
Mammogram images and pathology results were reviewed and discussed with patient. Clinical Stage IA left breast invasive carcinoma.  ER/PR positive, HER2 negative. Comment surgical resection-lumpectomy with sentinel lymph node biopsy. Recommend Oncotype DX testing Patient need to follow-up a few weeks after surgery to review final pathology and adjuvant plan.  Personal history of breast cancer x2 recommend genetic testing.  Refer to Dietitian.Marland Kitchen

## 2021-09-26 NOTE — Progress Notes (Signed)
Hematology/Oncology Consult Note [phone] [phone]     REFERRING PROVIDER: Denton Lank, MD   Patient Care Team: Denton Lank, MD as PCP - General (Family Medicine) Christene Lye, MD as Consulting Physician (General Surgery) Denton Lank, MD as Referring Physician (Family Medicine) Daiva Huge, RN as Oncology Nurse Navigator  CHIEF COMPLAINTS/PURPOSE OF CONSULTATION:  Breast cancer  HISTORY OF PRESENTING ILLNESS:  Adriana Berg 66 y.o. female presents to establish care for breast cancer I have reviewed her chart and materials related to her cancer extensively and collaborated history with the patient. Summary of oncologic history is as follows: Oncology History  Breast cancer ([place])  08/04/2021 Imaging   Bilateral screening mammogram In the right breast, a possible asymmetry warrants further evaluation. Interval resection of RIGHT breast dystrophic calcifications.   In the left breast, possible architectural distortion warrants further evaluation.     08/28/2021 Mammogram   Bilateral diagnostic mammogram showed 1. There is a suspicious 6 mm mass in the LEFT breast at 2 o'clock. Recommend ultrasound-guided biopsy for definitive characterization. Recommend attention on post marker placement mammogram to assess for mammographic/sonographic correlation. 2. No suspicious LEFT axillary adenopathy. 3. No mammographic evidence of malignancy in the RIGHT breast.     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  Grade 1.  DCIS present, LVI not identified, ER+ 90%, PR+ 90%, HER2 negative[score 1+]     Remote stage I right breast cancer in 2007, status post resection.  Patient recalls taking adjuvant endocrine therapy for 5 years.  Details unknown.  Previous pathology report is not available in EMR.  Menarche, 66 years of age Hysterectomy at age of  29 Short term OCP use in 9s.  About 1 year. Family history positive for sister with breast cancer. No previous chest radiation.   MEDICAL HISTORY:  Past Medical History:  Diagnosis Date   Borderline diabetes    Breast cancer ([place]) 2007   RT LUMPECTOMY, ER +   Lab test positive for detection of COVID-19 virus 12/2018   MRSA (methicillin resistant staph aureus) culture positive 06/2015   lower abdomen/mons pubis abscess   Personal history of malignant neoplasm of breast    invasive ductal carcinoma with tubular features,Stage 1,ER pos,HER2 neg   Personal history of radiation therapy 2007   Personal history of tobacco use, presenting hazards to health    Pneumonia 02/2015   Radiation 2007   BREAST CA   Unspecified essential hypertension     SURGICAL HISTORY: Past Surgical History:  Procedure Laterality Date   ABDOMINAL HYSTERECTOMY  1995   AXILLARY SENTINEL NODE BIOPSY Right 2007   BREAST BIOPSY Right 2007   CORE - POS   BREAST BIOPSY Right 1995   EXCISIONAL - NEG   BREAST BIOPSY Left 09/17/2021   u/s bx, heart clip. 2:00, path pending.   BREAST LUMPECTOMY Right 2007   breast ca   BREAST SURGERY  2007   right lumpectomy   COLONOSCOPY  12/27/2012   COLONOSCOPY WITH PROPOFOL N/A 02/14/2019   Procedure: COLONOSCOPY WITH PROPOFOL;  Surgeon: Jonathon Bellows, MD;  Location: [facility] ENDOSCOPY;  Service: Gastroenterology;  Laterality: N/A;   EXCISION OF BREAST BIOPSY Right 01/28/2021   Procedure: EXCISION OF BREAST abscess;  Surgeon: Olean Ree, MD;  Location: [facility] ORS;  Service: General;  Laterality: Right;    SOCIAL HISTORY: Social History   Socioeconomic History   Marital status: Single  Spouse name: Not on file   Number of children: Not on file   Years of education: Not on file   Highest education level: Not on file  Occupational History   Not on file  Tobacco Use   Smoking status: Some Days    Packs/day: 0.25    Years: 41.00    Total pack years: 10.25     Types: Cigarettes    Passive exposure: Past   Smokeless tobacco: Never  Vaping Use   Vaping Use: Never used  Substance and Sexual Activity   Alcohol use: No   Drug use: No   Sexual activity: Not on file  Other Topics Concern   Not on file  Social History Narrative   Lives at home with son, Independent at baseline   Social Determinants of Health   Financial Resource Strain: Not on file  Food Insecurity: Not on file  Transportation Needs: Not on file  Physical Activity: Not on file  Stress: Not on file  Social Connections: Not on file  Intimate Partner Violence: Not on file    FAMILY HISTORY: Family History  Problem Relation Age of Onset   Diabetes Mellitus II Mother    CAD Father    Hypertension Sister    Diabetes Sister    Cancer Sister    Breast cancer Sister 62   Stroke Brother     ALLERGIES:  is allergic to azithromycin.  MEDICATIONS:  Current Outpatient Medications  Medication Sig Dispense Refill   amLODipine (NORVASC) 2.5 MG tablet Take 2.5 mg by mouth daily.     aspirin EC 81 MG tablet Take 81 mg by mouth daily. Swallow whole.     cholecalciferol (VITAMIN D) 25 MCG (1000 UNIT) tablet Take 1,000 Units by mouth daily.     lidocaine-prilocaine (EMLA) cream Apply to the left areola and cover with plastic wrap one hour prior to leaving for surgery. 5 g 0   lisinopril (ZESTRIL) 40 MG tablet Take 40 mg by mouth daily.     spironolactone (ALDACTONE) 50 MG tablet Take 50 mg by mouth daily.      VITAMIN D PO Take 1,000 Units by mouth daily.     No current facility-administered medications for this visit.    Review of Systems  Constitutional:  Negative for appetite change, chills, fatigue and fever.  HENT:   Negative for hearing loss and voice change.   Eyes:  Negative for eye problems.  Respiratory:  Negative for chest tightness and cough.   Cardiovascular:  Negative for chest pain.  Gastrointestinal:  Negative for abdominal distention, abdominal pain and blood  in stool.  Endocrine: Negative for hot flashes.  Genitourinary:  Negative for difficulty urinating and frequency.   Musculoskeletal:  Negative for arthralgias.  Skin:  Negative for itching and rash.  Neurological:  Negative for extremity weakness.  Hematological:  Negative for adenopathy.  Psychiatric/Behavioral:  Negative for confusion.      PHYSICAL EXAMINATION: ECOG PERFORMANCE STATUS: 0 - Asymptomatic  Vitals:   09/26/21 0935  BP: (!) 141/80  Pulse: 65  Resp: 18  Temp: 98.5 F (36.9 C)   Filed Weights   09/26/21 0935  Weight: 152 lb 9.6 oz (69.2 kg)    Physical Exam Constitutional:      General: She is not in acute distress.    Appearance: She is not diaphoretic.  HENT:     Head: Normocephalic and atraumatic.     Nose: Nose normal.     Mouth/Throat:  Pharynx: No oropharyngeal exudate.  Eyes:     General: No scleral icterus.    Pupils: Pupils are equal, round, and reactive to light.  Cardiovascular:     Rate and Rhythm: Normal rate and regular rhythm.     Heart sounds: No murmur heard. Pulmonary:     Effort: Pulmonary effort is normal. No respiratory distress.     Breath sounds: No rales.  Chest:     Chest wall: No tenderness.  Abdominal:     General: There is no distension.     Palpations: Abdomen is soft.     Tenderness: There is no abdominal tenderness.  Musculoskeletal:        General: Normal range of motion.     Cervical back: Normal range of motion and neck supple.  Skin:    General: Skin is warm and dry.     Findings: No erythema.  Neurological:     Mental Status: She is alert and oriented to person, place, and time.     Cranial Nerves: No cranial nerve deficit.     Motor: No abnormal muscle tone.     Coordination: Coordination normal.  Psychiatric:        Mood and Affect: Mood and affect normal.   Breast exam was performed in seated and lying down position. Right breast status post lumpectomy with a well-healed surgical  scar/indentation No palpable mass bilaterally.  No palpable axillary lymphadenopathy bilaterally    LABORATORY DATA:  I have reviewed the data as listed    Latest Ref Rng & Units 09/26/2021   10:03 AM 01/22/2021    8:35 AM 06/17/2017   11:49 AM  CBC  WBC 4.0 - 10.5 K/uL 6.0  4.9  5.7   Hemoglobin 12.0 - 15.0 g/dL 15.1  15.0  14.9   Hematocrit 36.0 - 46.0 % 44.7  43.8  43.8   Platelets 150 - 400 K/uL 191  208  194       Latest Ref Rng & Units 09/26/2021   10:03 AM 01/22/2021    8:35 AM 06/17/2017   11:49 AM  CMP  Glucose 70 - 99 mg/dL 100  86  101   BUN 8 - 23 mg/dL '17  12  9   ' Creatinine 0.44 - 1.00 mg/dL 0.86  0.96  0.82   Sodium 135 - 145 mmol/L 140  135  139   Potassium 3.5 - 5.1 mmol/L 4.3  4.3  4.0   Chloride 98 - 111 mmol/L 105  103  103   CO2 22 - 32 mmol/L '29  26  26   ' Calcium 8.9 - 10.3 mg/dL 9.3  9.3  9.1   Total Protein 6.5 - 8.1 g/dL 8.1     Total Bilirubin 0.3 - 1.2 mg/dL 0.5     Alkaline Phos 38 - 126 U/L 56     AST 15 - 41 U/L 14     ALT 0 - 44 U/L 12          RADIOGRAPHIC STUDIES: I have personally reviewed the radiological images as listed and agreed with the findings in the report. [place] LT BREAST BX W LOC DEV 1ST LESION IMG BX SPEC US GUIDE  Addendum Date: 09/18/2021   ADDENDUM REPORT: 09/18/2021 14:45 ADDENDUM: PATHOLOGY revealed: BREAST, LEFT, MASS; CORE BIOPSIES: - INVASIVE MAMMARY CARCINOMA, NO SPECIAL TYPE. Size of invasive carcinoma: 5 mm in this sample. Grade 1. Ductal carcinoma in situ: Present. Lymphovascular invasion: Not identified. Pathology results are CONCORDANT with  imaging findings, per Dr. Fidela Salisbury. Pathology results and recommendations were discussed with patient via telephone on 09/18/2021. Patient reported biopsy site doing well with no adverse symptoms, and only slight tenderness at the site. Post biopsy care instructions were reviewed, questions were answered and my direct phone number was provided. Patient was instructed to call  [facility] for any additional questions or concerns related to biopsy site. RECOMMENDATIONS: Surgical consultation. Request for surgical consultation relayed to Casper Harrison RN at [facility] by Electa Sniff RN on 09/18/2021. Pathology results reported by Electa Sniff RN on 09/18/2021. Electronically Signed   By: Fidela Salisbury M.D.   On: 09/18/2021 14:45   Result Date: 09/18/2021 CLINICAL DATA:  Left breast 2 o'clock mass. EXAM: ULTRASOUND GUIDED LEFT BREAST CORE NEEDLE BIOPSY COMPARISON:  Previous exam(s). PROCEDURE: I met with the patient and we discussed the procedure of ultrasound-guided biopsy, including benefits and alternatives. We discussed the high likelihood of a successful procedure. We discussed the risks of the procedure, including infection, bleeding, tissue injury, clip migration, and inadequate sampling. Informed written consent was given. The usual time-out protocol was performed immediately prior to the procedure. Lesion quadrant: Upper outer quadrant Using sterile technique and 1% Lidocaine as local anesthetic, under direct ultrasound visualization, a 14 gauge spring-loaded device was used to perform biopsy of left breast 2 o'clock mass using a inferior approach. At the conclusion of the procedure heart shaped tissue marker clip was deployed into the biopsy cavity. Follow up 2 view mammogram was performed and dictated separately. IMPRESSION: Ultrasound guided biopsy of the left breast. No apparent complications. Electronically Signed: By: Fidela Salisbury M.D. On: 09/17/2021 10:11   MM CLIP PLACEMENT LEFT  Result Date: 09/17/2021 CLINICAL DATA:  Post ultrasound-guided core needle biopsy of left breast 2 o'clock mass. EXAM: 3D DIAGNOSTIC LEFT MAMMOGRAM POST ULTRASOUND BIOPSY COMPARISON:  Previous exam(s). FINDINGS: 3D Mammographic images were obtained following ultrasound guided biopsy of left breast 2 o'clock. The biopsy marking clip is in expected position at  the site of biopsy. IMPRESSION: Appropriate positioning of the heart shaped biopsy marking clip at the site of biopsy in the left breast 2 o'clock. Final Assessment: Post Procedure Mammograms for Marker Placement Electronically Signed   By: Fidela Salisbury M.D.   On: 09/17/2021 10:17  MM DIAG BREAST TOMO BILATERAL  Result Date: 08/28/2021 CLINICAL DATA:  RIGHT lumpectomy in 2007 with a excision of a RIGHT breast abscess in 2023. callback for RIGHT breast asymmetry and LEFT breast distortion EXAM: DIGITAL DIAGNOSTIC BILATERAL MAMMOGRAM WITH TOMOSYNTHESIS; ULTRASOUND LEFT BREAST LIMITED TECHNIQUE: Bilateral digital diagnostic mammography and breast tomosynthesis was performed.; Targeted ultrasound examination of the left breast was performed. COMPARISON:  Previous exam(s). ACR Breast Density Category b: There are scattered areas of fibroglandular density. FINDINGS: The previously described finding in the RIGHT breast does not persist with additional views, consistent with superimposed fibroglandular tissue. Stable postsurgical changes are noted. Diagnostic images of the LEFT breast confirm persistence of an irregular spiculated mass with associated distortion in the LEFT upper outer breast at middle depth. It is best seen on ML slice 57, spot CC slice 27 and a likely correlate on MLO slice 50. Targeted ultrasound was performed LEFT upper outer breast. At 2 o'clock 5 cm from the nipple, there is an irregular hypoechoic mass with irregular margins. It measures 5 x 6 x 4 mm. This corresponds to the site of screening mammographic concern. Targeted ultrasound was performed of the LEFT axilla. No suspicious axillary  lymph nodes are visualized. IMPRESSION: 1. There is a suspicious 6 mm mass in the LEFT breast at 2 o'clock. Recommend ultrasound-guided biopsy for definitive characterization. Recommend attention on post marker placement mammogram to assess for mammographic/sonographic correlation. 2. No suspicious LEFT  axillary adenopathy. 3. No mammographic evidence of malignancy in the RIGHT breast. RECOMMENDATION: LEFT breast ultrasound-guided biopsy x1 I have discussed the findings and recommendations with the patient. The biopsy procedure was discussed with the patient and questions were answered. Patient expressed their understanding of the biopsy recommendation. Patient will be scheduled for biopsy at her earliest convenience by the schedulers. Ordering provider will be notified. If applicable, a reminder letter will be sent to the patient regarding the next appointment. BI-RADS CATEGORY  4: Suspicious. Electronically Signed   By: Valentino Saxon M.D.   On: 08/28/2021 12:22  US BREAST LTD UNI LEFT INC AXILLA  Result Date: 08/28/2021 CLINICAL DATA:  RIGHT lumpectomy in 2007 with a excision of a RIGHT breast abscess in 2023. callback for RIGHT breast asymmetry and LEFT breast distortion EXAM: DIGITAL DIAGNOSTIC BILATERAL MAMMOGRAM WITH TOMOSYNTHESIS; ULTRASOUND LEFT BREAST LIMITED TECHNIQUE: Bilateral digital diagnostic mammography and breast tomosynthesis was performed.; Targeted ultrasound examination of the left breast was performed. COMPARISON:  Previous exam(s). ACR Breast Density Category b: There are scattered areas of fibroglandular density. FINDINGS: The previously described finding in the RIGHT breast does not persist with additional views, consistent with superimposed fibroglandular tissue. Stable postsurgical changes are noted. Diagnostic images of the LEFT breast confirm persistence of an irregular spiculated mass with associated distortion in the LEFT upper outer breast at middle depth. It is best seen on ML slice 57, spot CC slice 27 and a likely correlate on MLO slice 50. Targeted ultrasound was performed LEFT upper outer breast. At 2 o'clock 5 cm from the nipple, there is an irregular hypoechoic mass with irregular margins. It measures 5 x 6 x 4 mm. This corresponds to the site of screening  mammographic concern. Targeted ultrasound was performed of the LEFT axilla. No suspicious axillary lymph nodes are visualized. IMPRESSION: 1. There is a suspicious 6 mm mass in the LEFT breast at 2 o'clock. Recommend ultrasound-guided biopsy for definitive characterization. Recommend attention on post marker placement mammogram to assess for mammographic/sonographic correlation. 2. No suspicious LEFT axillary adenopathy. 3. No mammographic evidence of malignancy in the RIGHT breast. RECOMMENDATION: LEFT breast ultrasound-guided biopsy x1 I have discussed the findings and recommendations with the patient. The biopsy procedure was discussed with the patient and questions were answered. Patient expressed their understanding of the biopsy recommendation. Patient will be scheduled for biopsy at her earliest convenience by the schedulers. Ordering provider will be notified. If applicable, a reminder letter will be sent to the patient regarding the next appointment. BI-RADS CATEGORY  4: Suspicious. Electronically Signed   By: Valentino Saxon M.D.   On: 08/28/2021 12:22   ASSESSMENT & PLAN:  Breast cancer [facility]) Mammogram images and pathology results were reviewed and discussed with patient. Clinical Stage IA left breast invasive carcinoma.  ER/PR positive, HER2 negative. Comment surgical resection-lumpectomy with sentinel lymph node biopsy. Recommend Oncotype DX testing Patient need to follow-up a few weeks after surgery to review final pathology and adjuvant plan.  Personal history of breast cancer x2 recommend genetic testing.  Refer to Dietitian..  Goals of care, counseling/discussion Discussed with patient  History of breast cancer Remote right breast history of cancer status post surgery. Patient took endocrine therapy for 5 years.  Details unknown.  Family history of breast cancer Refer to genetic counseling.   Orders Placed This Encounter  Procedures   CBC with  Differential/Platelet    Standing Status:   Future    Number of Occurrences:   1    Standing Expiration Date:   09/27/2022   Comprehensive metabolic panel    Standing Status:   Future    Number of Occurrences:   1    Standing Expiration Date:   09/26/2022   Ambulatory referral to Genetics    Referral Priority:   Routine    Referral Type:   Consultation    Referral Reason:   Specialty Services Required    Number of Visits Requested:   1   Follow-up TBD. Plan to see patient a few weeks after surgery to go over pathology results and adjuvant plan.   All questions were answered. The patient knows to call the clinic with any problems, questions or concerns. No barriers to learning was detected.  Earlie Server, MD 09/26/2021

## 2021-09-26 NOTE — Assessment & Plan Note (Signed)
Refer to genetic counseling.

## 2021-09-26 NOTE — Assessment & Plan Note (Signed)
Remote right breast history of cancer status post surgery. Patient took endocrine therapy for 5 years.  Details unknown.

## 2021-09-26 NOTE — Progress Notes (Signed)
Accompanied patient to initial medical oncology appointment.   Reviewed Breast Cancer treatment handbook.   Care plan summary given to patient.   Reviewed outreach programs and cancer center services.  

## 2021-10-01 ENCOUNTER — Ambulatory Visit
Admission: RE | Admit: 2021-10-01 | Discharge: 2021-10-01 | Disposition: A | Discharge status: Home / Self Care | Payer: 59 | Source: Ambulatory Visit | Attending: Surgery | Admitting: Surgery

## 2021-10-01 DIAGNOSIS — R928 Other abnormal and inconclusive findings on diagnostic imaging of breast: Secondary | ICD-10-CM | POA: Insufficient documentation

## 2021-10-02 ENCOUNTER — Inpatient Hospital Stay: Payer: 59 | Admitting: Licensed Clinical Social Worker

## 2021-10-02 ENCOUNTER — Inpatient Hospital Stay: Payer: 59

## 2021-10-06 ENCOUNTER — Encounter
Admission: RE | Admit: 2021-10-06 | Discharge: 2021-10-06 | Disposition: A | Discharge status: Home / Self Care | Payer: No Typology Code available for payment source | Source: Ambulatory Visit | Attending: Surgery | Admitting: Surgery

## 2021-10-06 NOTE — Patient Instructions (Signed)
Your procedure is scheduled on:10-10-21 Friday Report to the Registration Desk on the 1st floor of the [place].Then proceed to the Radiology Desk (2nd desk on right) Arrive at 11:30 am   REMEMBER: Instructions that are not followed completely may result in serious medical risk, up to and including death; or upon the discretion of your surgeon and anesthesiologist your surgery may need to be rescheduled.  Do not eat food after midnight the night before surgery.  No gum chewing, lozengers or hard candies.  You may however, drink CLEAR liquids up to 2 hours before you are scheduled to arrive for your surgery. Do not drink anything within 2 hours of your scheduled arrival time.  Clear liquids include: - water  - apple juice without pulp - gatorade (not RED colors) - black coffee or tea (Do NOT add milk or creamers to the coffee or tea) Do NOT drink anything that is not on this list.  TAKE THESE MEDICATIONS THE MORNING OF SURGERY WITH A SIP OF WATER: -amLODipine (NORVASC)  Last dose of 81 mg Aspirin was on 10-02-21   One week prior to surgery: Stop Anti-inflammatories (NSAIDS) such as Advil, Aleve, Ibuprofen, Motrin, Naproxen, Naprosyn and Aspirin based products such as Excedrin, Goodys Powder, BC Powder.You may however, continue to take Tylenol if needed for pain up until the day of surgery.  Stop ANY OVER THE COUNTER supplements/vitamins NOW (10-06-21) until after surgery.  No Alcohol for 24 hours before or after surgery.  No Smoking including e-cigarettes for 24 hours prior to surgery.  No chewable tobacco products for at least 6 hours prior to surgery.  No nicotine patches on the day of surgery.  Do not use any "recreational" drugs for at least a week prior to your surgery.  Please be advised that the combination of cocaine and anesthesia may have negative outcomes, up to and including death. If you test positive for cocaine, your surgery will be cancelled.  On the morning of  surgery brush your teeth with toothpaste and water, you may rinse your mouth with mouthwash if you wish. Do not swallow any toothpaste or mouthwash.  Use CHG Soap as directed on instruction sheet.  Do not wear jewelry, make-up, hairpins, clips or nail polish.  Do not wear lotions, powders, or perfumes.   Do not shave body from the neck down 48 hours prior to surgery just in case you cut yourself which could leave a site for infection.  Also, freshly shaved skin may become irritated if using the CHG soap.  Contact lenses, hearing aids and dentures may not be worn into surgery.  Do not bring valuables to the hospital. [facility] is not responsible for any missing/lost belongings or valuables.  Notify your doctor if there is any change in your medical condition (cold, fever, infection).  Wear comfortable clothing (specific to your surgery type) to the hospital.  After surgery, you can help prevent lung complications by doing breathing exercises.  Take deep breaths and cough every 1-2 hours. Your doctor may order a device called an Incentive Spirometer to help you take deep breaths. When coughing or sneezing, hold a pillow firmly against your incision with both hands. This is called "splinting." Doing this helps protect your incision. It also decreases belly discomfort.  If you are being admitted to the hospital overnight, leave your suitcase in the car. After surgery it may be brought to your room.  If you are being discharged the day of surgery, you will not be  allowed to drive home. You will need a responsible adult (18 years or older) to drive you home and stay with you that night.   If you are taking public transportation, you will need to have a responsible adult (18 years or older) with you. Please confirm with your physician that it is acceptable to use public transportation.   Please call the [place] Dept. at [phone] if you have any questions about these  instructions.  Surgery Visitation Policy:  Patients undergoing a surgery or procedure may have two family members or support persons with them as long as the person is not COVID-19 positive or experiencing its symptoms.

## 2021-10-10 ENCOUNTER — Encounter
Admission: RE | Disposition: A | Discharge status: Home / Self Care | Payer: Self-pay | Source: Home / Self Care | Attending: Surgery

## 2021-10-10 ENCOUNTER — Ambulatory Visit
Admission: RE | Admit: 2021-10-10 | Discharge: 2021-10-10 | Disposition: A | Discharge status: Home / Self Care | Payer: No Typology Code available for payment source | Attending: Surgery | Admitting: Surgery

## 2021-10-10 ENCOUNTER — Encounter: Payer: Self-pay | Admitting: Surgery

## 2021-10-10 ENCOUNTER — Encounter
Admission: RE | Admit: 2021-10-10 | Discharge: 2021-10-10 | Disposition: A | Discharge status: Home / Self Care | Payer: No Typology Code available for payment source | Source: Ambulatory Visit | Attending: Surgery | Admitting: Surgery

## 2021-10-10 ENCOUNTER — Ambulatory Visit
Admission: RE | Admit: 2021-10-10 | Discharge: 2021-10-10 | Disposition: A | Discharge status: Home / Self Care | Payer: No Typology Code available for payment source | Source: Ambulatory Visit | Attending: Surgery | Admitting: Surgery

## 2021-10-10 ENCOUNTER — Other Ambulatory Visit: Payer: Self-pay

## 2021-10-10 ENCOUNTER — Ambulatory Visit: Payer: No Typology Code available for payment source | Admitting: Registered Nurse

## 2021-10-10 DIAGNOSIS — Z853 Personal history of malignant neoplasm of breast: Secondary | ICD-10-CM | POA: Diagnosis not present

## 2021-10-10 DIAGNOSIS — I10 Essential (primary) hypertension: Secondary | ICD-10-CM | POA: Diagnosis not present

## 2021-10-10 DIAGNOSIS — F17210 Nicotine dependence, cigarettes, uncomplicated: Secondary | ICD-10-CM | POA: Diagnosis not present

## 2021-10-10 DIAGNOSIS — C50412 Malignant neoplasm of upper-outer quadrant of left female breast: Secondary | ICD-10-CM | POA: Insufficient documentation

## 2021-10-10 DIAGNOSIS — Z170 Estrogen receptor positive status [ER+]: Secondary | ICD-10-CM

## 2021-10-10 DIAGNOSIS — Z923 Personal history of irradiation: Secondary | ICD-10-CM | POA: Diagnosis not present

## 2021-10-10 DIAGNOSIS — Z803 Family history of malignant neoplasm of breast: Secondary | ICD-10-CM | POA: Diagnosis not present

## 2021-10-10 DIAGNOSIS — R928 Other abnormal and inconclusive findings on diagnostic imaging of breast: Secondary | ICD-10-CM

## 2021-10-10 HISTORY — PX: BREAST LUMPECTOMY,RADIO FREQ LOCALIZER,AXILLARY SENTINEL LYMPH NODE BIOPSY: SHX6900

## 2021-10-10 HISTORY — PX: BREAST LUMPECTOMY: SHX2

## 2021-10-10 SURGERY — BREAST LUMPECTOMY,RADIO FREQ LOCALIZER,AXILLARY SENTINEL LYMPH NODE BIOPSY
Anesthesia: General | Laterality: Left

## 2021-10-10 MED ORDER — OXYCODONE HCL 5 MG PO TABS: 5.0000 mg | ORAL_TABLET | ORAL | 0 refills | Status: DC | PRN

## 2021-10-10 MED ORDER — ORAL CARE MOUTH RINSE: 15.0000 mL | Freq: Once | OROMUCOSAL | Status: AC

## 2021-10-10 MED ORDER — TECHNETIUM TC 99M TILMANOCEPT KIT
1.1090 | PACK | Freq: Once | INTRAVENOUS | Status: AC | PRN
  Administered 2021-10-10: 1.109 via INTRADERMAL

## 2021-10-10 MED ORDER — BUPIVACAINE-EPINEPHRINE 0.5% -1:200000 IJ SOLN
INTRAMUSCULAR | Status: DC | PRN
  Administered 2021-10-10: 40 mL

## 2021-10-10 MED ORDER — MIDAZOLAM HCL 2 MG/2ML IJ SOLN
INTRAMUSCULAR | Status: DC | PRN
  Administered 2021-10-10: 2 mg via INTRAVENOUS

## 2021-10-10 MED ORDER — GLYCOPYRROLATE 0.2 MG/ML IJ SOLN
INTRAMUSCULAR | Status: DC | PRN
  Administered 2021-10-10: .2 mg via INTRAVENOUS

## 2021-10-10 MED ORDER — GABAPENTIN 300 MG PO CAPS: 300.0000 mg | ORAL_CAPSULE | ORAL | Status: AC

## 2021-10-10 MED ORDER — LACTATED RINGERS IV SOLN: INTRAVENOUS | Status: DC

## 2021-10-10 MED ORDER — OXYCODONE HCL 5 MG PO TABS
ORAL_TABLET | ORAL | Status: AC
  Filled 2021-10-10: qty 1

## 2021-10-10 MED ORDER — CHLORHEXIDINE GLUCONATE CLOTH 2 % EX PADS
6.0000 | MEDICATED_PAD | Freq: Once | CUTANEOUS | Status: AC
  Administered 2021-10-10: 6 via TOPICAL

## 2021-10-10 MED ORDER — CHLORHEXIDINE GLUCONATE 0.12 % MT SOLN: 15.0000 mL | Freq: Once | OROMUCOSAL | Status: AC

## 2021-10-10 MED ORDER — ONDANSETRON HCL 4 MG/2ML IJ SOLN: 4.0000 mg | Freq: Once | INTRAMUSCULAR | Status: DC | PRN

## 2021-10-10 MED ORDER — FAMOTIDINE 20 MG PO TABS
ORAL_TABLET | ORAL | Status: AC
  Administered 2021-10-10: 20 mg via ORAL
  Filled 2021-10-10: qty 1

## 2021-10-10 MED ORDER — STERILE WATER FOR IRRIGATION IR SOLN
Status: DC | PRN
  Administered 2021-10-10: 200 mL

## 2021-10-10 MED ORDER — GLYCOPYRROLATE 0.2 MG/ML IJ SOLN
INTRAMUSCULAR | Status: AC
  Filled 2021-10-10: qty 1

## 2021-10-10 MED ORDER — FAMOTIDINE 20 MG PO TABS: 20.0000 mg | ORAL_TABLET | Freq: Once | ORAL | Status: AC

## 2021-10-10 MED ORDER — FENTANYL CITRATE (PF) 100 MCG/2ML IJ SOLN
25.0000 ug | INTRAMUSCULAR | Status: AC | PRN
  Administered 2021-10-10 (×8): 25 ug via INTRAVENOUS

## 2021-10-10 MED ORDER — SODIUM CHLORIDE (PF) 0.9 % IJ SOLN
INTRAVENOUS | Status: DC | PRN
  Administered 2021-10-10: 5 mL via INTRAMUSCULAR

## 2021-10-10 MED ORDER — BUPIVACAINE-EPINEPHRINE (PF) 0.5% -1:200000 IJ SOLN
INTRAMUSCULAR | Status: AC
  Filled 2021-10-10: qty 30

## 2021-10-10 MED ORDER — BUPIVACAINE-EPINEPHRINE (PF) 0.5% -1:200000 IJ SOLN: INTRAMUSCULAR | Status: DC | PRN

## 2021-10-10 MED ORDER — FENTANYL CITRATE (PF) 100 MCG/2ML IJ SOLN
INTRAMUSCULAR | Status: AC
  Filled 2021-10-10: qty 2

## 2021-10-10 MED ORDER — MIDAZOLAM HCL 2 MG/2ML IJ SOLN
INTRAMUSCULAR | Status: AC
  Filled 2021-10-10: qty 2

## 2021-10-10 MED ORDER — METHYLENE BLUE 1 % INJ SOLN
INTRAVENOUS | Status: AC
  Filled 2021-10-10: qty 10

## 2021-10-10 MED ORDER — FENTANYL CITRATE (PF) 100 MCG/2ML IJ SOLN
INTRAMUSCULAR | Status: DC | PRN
  Administered 2021-10-10: 25 ug via INTRAVENOUS
  Administered 2021-10-10: 50 ug via INTRAVENOUS

## 2021-10-10 MED ORDER — CHLORHEXIDINE GLUCONATE 0.12 % MT SOLN
OROMUCOSAL | Status: AC
  Administered 2021-10-10: 15 mL via OROMUCOSAL
  Filled 2021-10-10: qty 15

## 2021-10-10 MED ORDER — BUPIVACAINE LIPOSOME 1.3 % IJ SUSP
INTRAMUSCULAR | Status: AC
  Filled 2021-10-10: qty 10

## 2021-10-10 MED ORDER — PROPOFOL 10 MG/ML IV BOLUS
INTRAVENOUS | Status: AC
  Filled 2021-10-10: qty 20

## 2021-10-10 MED ORDER — CEFAZOLIN SODIUM-DEXTROSE 2-4 GM/100ML-% IV SOLN
2.0000 g | INTRAVENOUS | Status: AC
  Administered 2021-10-10: 2 g via INTRAVENOUS

## 2021-10-10 MED ORDER — DEXMEDETOMIDINE HCL IN NACL 200 MCG/50ML IV SOLN
INTRAVENOUS | Status: DC | PRN
  Administered 2021-10-10 (×3): 4 ug via INTRAVENOUS

## 2021-10-10 MED ORDER — PROPOFOL 10 MG/ML IV BOLUS
INTRAVENOUS | Status: DC | PRN
  Administered 2021-10-10: 120 mg via INTRAVENOUS

## 2021-10-10 MED ORDER — ACETAMINOPHEN 500 MG PO TABS: 1000.0000 mg | ORAL_TABLET | Freq: Four times a day (QID) | ORAL | Status: AC | PRN

## 2021-10-10 MED ORDER — ACETAMINOPHEN 500 MG PO TABS: 1000.0000 mg | ORAL_TABLET | ORAL | Status: AC

## 2021-10-10 MED ORDER — LIDOCAINE HCL (PF) 2 % IJ SOLN
INTRAMUSCULAR | Status: AC
  Filled 2021-10-10: qty 5

## 2021-10-10 MED ORDER — IBUPROFEN 600 MG PO TABS: 600.0000 mg | ORAL_TABLET | Freq: Three times a day (TID) | ORAL | 1 refills | Status: AC | PRN

## 2021-10-10 MED ORDER — EPHEDRINE SULFATE (PRESSORS) 50 MG/ML IJ SOLN
INTRAMUSCULAR | Status: DC | PRN
  Administered 2021-10-10 (×2): 5 mg via INTRAVENOUS
  Administered 2021-10-10: 10 mg via INTRAVENOUS

## 2021-10-10 MED ORDER — ONDANSETRON HCL 4 MG/2ML IJ SOLN
INTRAMUSCULAR | Status: AC
  Filled 2021-10-10: qty 2

## 2021-10-10 MED ORDER — ACETAMINOPHEN 500 MG PO TABS
ORAL_TABLET | ORAL | Status: AC
  Administered 2021-10-10: 1000 mg via ORAL
  Filled 2021-10-10: qty 2

## 2021-10-10 MED ORDER — DEXAMETHASONE SODIUM PHOSPHATE 10 MG/ML IJ SOLN
INTRAMUSCULAR | Status: DC | PRN
  Administered 2021-10-10: 10 mg via INTRAVENOUS

## 2021-10-10 MED ORDER — OXYCODONE HCL 5 MG PO TABS
5.0000 mg | ORAL_TABLET | Freq: Once | ORAL | Status: AC
  Administered 2021-10-10: 5 mg via ORAL

## 2021-10-10 MED ORDER — CEFAZOLIN SODIUM-DEXTROSE 2-4 GM/100ML-% IV SOLN
INTRAVENOUS | Status: AC
  Filled 2021-10-10: qty 100

## 2021-10-10 MED ORDER — DEXAMETHASONE SODIUM PHOSPHATE 10 MG/ML IJ SOLN
INTRAMUSCULAR | Status: AC
  Filled 2021-10-10: qty 1

## 2021-10-10 MED ORDER — LIDOCAINE HCL (CARDIAC) PF 100 MG/5ML IV SOSY
PREFILLED_SYRINGE | INTRAVENOUS | Status: DC | PRN
  Administered 2021-10-10: 80 mg via INTRAVENOUS

## 2021-10-10 MED ORDER — GABAPENTIN 300 MG PO CAPS
ORAL_CAPSULE | ORAL | Status: AC
  Administered 2021-10-10: 300 mg via ORAL
  Filled 2021-10-10: qty 1

## 2021-10-10 MED ORDER — BUPIVACAINE LIPOSOME 1.3 % IJ SUSP: 20.0000 mL | Freq: Once | INTRAMUSCULAR | Status: DC

## 2021-10-10 SURGICAL SUPPLY — 59 items
ADH SKN CLS APL DERMABOND .7 (GAUZE/BANDAGES/DRESSINGS) ×1
APL PRP STRL LF DISP 70% ISPRP (MISCELLANEOUS) ×1
APPLIER CLIP 9.375 SM OPEN (CLIP)
APR CLP SM 9.3 20 MLT OPN (CLIP)
BINDER BREAST LRG (GAUZE/BANDAGES/DRESSINGS) IMPLANT
BINDER BREAST MEDIUM (GAUZE/BANDAGES/DRESSINGS) IMPLANT
BLADE PHOTON ILLUMINATED (MISCELLANEOUS) ×1 IMPLANT
BLADE SURG 15 STRL LF DISP TIS (BLADE) ×2 IMPLANT
BLADE SURG 15 STRL SS (BLADE) ×2
CHLORAPREP W/TINT 26 (MISCELLANEOUS) ×1 IMPLANT
CLIP APPLIE 9.375 SM OPEN (CLIP) IMPLANT
CNTNR SPEC 2.5X3XGRAD LEK (MISCELLANEOUS)
CONT SPEC 4OZ STER OR WHT (MISCELLANEOUS)
CONT SPEC 4OZ STRL OR WHT (MISCELLANEOUS)
CONTAINER SPEC 2.5X3XGRAD LEK (MISCELLANEOUS) ×1 IMPLANT
COVER PROBE FLX POLY STRL (MISCELLANEOUS) ×1 IMPLANT
COVER PROBE GAMMA FINDER SLV (MISCELLANEOUS) IMPLANT
DERMABOND ADVANCED .7 DNX12 (GAUZE/BANDAGES/DRESSINGS) ×1 IMPLANT
DEVICE DUBIN SPECIMEN MAMMOGRA (MISCELLANEOUS) ×1 IMPLANT
DRAPE LAPAROTOMY 100X77 ABD (DRAPES) ×1 IMPLANT
DRSG GAUZE FLUFF 36X18 (GAUZE/BANDAGES/DRESSINGS) ×1 IMPLANT
ELECT CAUTERY BLADE TIP 2.5 (TIP) ×1
ELECT REM PT RETURN 9FT ADLT (ELECTROSURGICAL) ×1
ELECTRODE CAUTERY BLDE TIP 2.5 (TIP) ×1 IMPLANT
ELECTRODE REM PT RTRN 9FT ADLT (ELECTROSURGICAL) ×1 IMPLANT
GAUZE 4X4 16PLY ~~LOC~~+RFID DBL (SPONGE) ×1 IMPLANT
GLOVE SURG SYN 7.0 (GLOVE) ×3 IMPLANT
GLOVE SURG SYN 7.0 PF PI (GLOVE) ×1 IMPLANT
GLOVE SURG SYN 7.5  E (GLOVE) ×3
GLOVE SURG SYN 7.5 E (GLOVE) ×3 IMPLANT
GLOVE SURG SYN 7.5 PF PI (GLOVE) ×1 IMPLANT
GOWN STRL REUS W/ TWL LRG LVL3 (GOWN DISPOSABLE) ×2 IMPLANT
GOWN STRL REUS W/TWL LRG LVL3 (GOWN DISPOSABLE) ×3
KIT MARKER MARGIN INK (KITS) IMPLANT
KIT TURNOVER KIT A (KITS) ×1 IMPLANT
LABEL OR SOLS (LABEL) ×1 IMPLANT
MANIFOLD NEPTUNE II (INSTRUMENTS) ×1 IMPLANT
MARGIN MAP 10MM (MISCELLANEOUS) IMPLANT
MARKER MARGIN CORRECT CLIP (MARKER) IMPLANT
NDL HYPO 25X1 1.5 SAFETY (NEEDLE) ×1 IMPLANT
NEEDLE HYPO 22GX1.5 SAFETY (NEEDLE) ×1 IMPLANT
NEEDLE HYPO 25X1 1.5 SAFETY (NEEDLE) ×1 IMPLANT
PACK BASIN MINOR ARMC (MISCELLANEOUS) ×1 IMPLANT
SET LOCALIZER 20 PROBE US (MISCELLANEOUS) ×1 IMPLANT
SUT ETHILON 3-0 FS-10 30 BLK (SUTURE)
SUT MNCRL 4-0 (SUTURE) ×1
SUT MNCRL 4-0 27XMFL (SUTURE) ×1
SUT SILK 3 0 SH 30 (SUTURE) ×1 IMPLANT
SUT VIC AB 3-0 SH 27 (SUTURE) ×1
SUT VIC AB 3-0 SH 27X BRD (SUTURE) ×1 IMPLANT
SUTURE EHLN 3-0 FS-10 30 BLK (SUTURE) IMPLANT
SUTURE MNCRL 4-0 27XMF (SUTURE) ×1 IMPLANT
SYR 10ML LL (SYRINGE) ×1 IMPLANT
SYR BULB IRRIG 60ML STRL (SYRINGE) ×1 IMPLANT
TAPE TRANSPORE STRL 2 31045 (GAUZE/BANDAGES/DRESSINGS) IMPLANT
TRAP FLUID SMOKE EVACUATOR (MISCELLANEOUS) ×1 IMPLANT
TRAP NEPTUNE SPECIMEN COLLECT (MISCELLANEOUS) ×1 IMPLANT
WATER STERILE IRR 1000ML POUR (IV SOLUTION) ×1 IMPLANT
WATER STERILE IRR 500ML POUR (IV SOLUTION) ×1 IMPLANT

## 2021-10-10 NOTE — Interval H&P Note (Signed)
History and Physical Interval Note:  10/10/2021 12:57 PM  Adriana Berg  has presented today for surgery, with the diagnosis of left breast cancer.  The various methods of treatment have been discussed with the patient and family. After consideration of risks, benefits and other options for treatment, the patient has consented to  Procedure(s): [place] (Left) as a surgical intervention.  The patient's history has been reviewed, patient examined, no change in status, stable for surgery.  I have reviewed the patient's chart and labs.  Questions were answered to the patient's satisfaction.     Harlean Regula

## 2021-10-10 NOTE — Op Note (Signed)
Procedure Date:  10/10/2021  Pre-operative Diagnosis:  Left breast cancer  Post-operative Diagnosis: Left breast cancer  Procedure:  Left breast SAVI tag-localized lumpectomy and sentinel lymph node biopsy  Surgeon:  Melvyn Neth, MD  Anesthesia:  General endotracheal  Estimated Blood Loss:  10 ml  Specimens:   Sentinel lymph node #1, hot/blue, count 652 Sentinel lymph node #2, hot/blue, count 250 Sentinel lymph node #3, hot/blue, count 2510 Left breast mass  Complications:  None  Operation performed with curative intent:Yes  Tracer(s) used to identify sentinel nodes in the upfront surgery (non-neoadjuvant) setting (select all that apply):Dye and Radioactive Tracer  All nodes (colored or non-colored) present at the end of a dye-filled lymphatic channel were removed:Yes   All significantly radioactive nodes were removed:Yes  All palpable suspicious nodes were removed:Yes  Biopsy-proven positive nodes marked with clips prior to chemotherapy were identified and removed:N/A  Indications for Procedure:  This is a 67 y.o. female who presents with newly diagnosed left breast cancer.  The risks of bleeding, infection, injury to surrounding structures, hematoma, seroma, open wound, cosmetic deformity, and the need for further surgery were all discussed with the patient and was willing to proceed.  Prior to this procedure, the patient had undergone SAVI tag localization and sentinel lymphoscintigraphy.  Description of Procedure: The patient was correctly identified in the preoperative area and brought into the operating room.  The patient was placed supine with VTE prophylaxis in place.  Appropriate time-outs were performed.  Anesthesia was induced and the patient was intubated.  Appropriate antibiotics were infused.  A visual dye was injected in the left periareolar region under aseptic conditions. The left chest and axilla were prepped and draped in usual sterile fashion.  Then  using the hand-held probe an area of high counts was identified in the axilla, and a 5 cm incision was made.  Cautery was used to dissect down the subcutaneous tissue and the hand-held probe was used to guide dissection. A hot and blue lymph node was identified and resected.  This had a count of 652.  Additional lymph nodes were identified, also hot and blue, and resected with counts of 250 and 2510.  Thus, the last lymph node, technically was the most sentinel lymph node with the highest count.  The cavity was irrigated and hemostasis was assured with electrocautery.  Local anesthetic was infiltrated into the skin and subcutaneous tissue of the cavity.  The wound was then closed in multiple layers with 3-0 Vicryl and 4-0 Monocryl and sealed with DermaBond.  Attention was turned to the SAVI tag localization site which was determined using the SAVI probe.  An incision was made overlying the tag and clip.  Probe was used to guide our dissection using electrocautery, and a partial mastectomy was performed with adequate margins.  MarginMarker was used to ink each of the sides of the specimen.  The specimen was then imaged to confirm that the area of concern, biopsy clip, and RF tag were included in the excision.  This was then sent to pathology.  The cavity was irrigated and hemostasis was assured with electrocautery.  Local anesthetic was infiltrated into the skin and subcutaneous tissue of the cavity.  The wound was then closed in multiple layers with 3-0 Vicryl and 4-0 Monocryl and sealed with DermaBond.  The patient was emerged from anesthesia and extubated and brought to the recovery room for further management.  The patient tolerated the procedure well and all counts were correct at the  end of the case.   Melvyn Neth, MD

## 2021-10-10 NOTE — Discharge Instructions (Signed)
AMBULATORY SURGERY  ?DISCHARGE INSTRUCTIONS ? ? ?The drugs that you were given will stay in your system until tomorrow so for the next 24 hours you should not: ? ?Drive an automobile ?Make any legal decisions ?Drink any alcoholic beverage ? ? ?You may resume regular meals tomorrow.  Today it is better to start with liquids and gradually work up to solid foods. ? ?You may eat anything you prefer, but it is better to start with liquids, then soup and crackers, and gradually work up to solid foods. ? ? ?Please notify your doctor immediately if you have any unusual bleeding, trouble breathing, redness and pain at the surgery site, drainage, fever, or pain not relieved by medication. ? ? ? ?Additional Instructions: ? ? ? ?Please contact your physician with any problems or Same Day Surgery at [phone], Monday through Friday 6 am to 4 pm, or [facility] Main number at [phone].  ?

## 2021-10-10 NOTE — Anesthesia Preprocedure Evaluation (Signed)
Anesthesia Evaluation  Patient identified by MRN, date of birth, ID band Patient awake    Reviewed: Allergy & Precautions, NPO status , Patient's Chart, lab work & pertinent test results  Airway Mallampati: II  TM Distance: >3 FB Neck ROM: full    Dental  (+) Upper Dentures, Lower Dentures   Pulmonary neg pulmonary ROS, Current Smoker and Patient abstained from smoking.,    Pulmonary exam normal breath sounds clear to auscultation       Cardiovascular Exercise Tolerance: Good hypertension, Pt. on medications negative cardio ROS Normal cardiovascular exam Rhythm:Regular     Neuro/Psych negative neurological ROS  negative psych ROS   GI/Hepatic negative GI ROS, Neg liver ROS,   Endo/Other  negative endocrine ROS  Renal/GU negative Renal ROS     Musculoskeletal   Abdominal Normal abdominal exam  (+)   Peds negative pediatric ROS (+)  Hematology negative hematology ROS (+)   Anesthesia Other Findings Past Medical History: No date: Borderline diabetes 2007: Breast cancer ([place])     Comment:  RT LUMPECTOMY, ER + 12/2018: Lab test positive for detection of COVID-19 virus 06/2015: MRSA (methicillin resistant staph aureus) culture positive     Comment:  lower abdomen/mons pubis abscess No date: Personal history of malignant neoplasm of breast     Comment:  invasive ductal carcinoma with tubular features,Stage               1,ER pos,HER2 neg 2007: Personal history of radiation therapy No date: Personal history of tobacco use, presenting hazards to health 02/2015: Pneumonia 2007: Radiation     Comment:  BREAST CA No date: Unspecified essential hypertension  Past Surgical History: 1995: ABDOMINAL HYSTERECTOMY 2007: AXILLARY SENTINEL NODE BIOPSY; Right 2007: BREAST BIOPSY; Right     Comment:  CORE - POS 1995: BREAST BIOPSY; Right     Comment:  EXCISIONAL - NEG 09/17/2021: BREAST BIOPSY; Left     Comment:  u/s bx,  heart clip. 2:00, path pending. 2007: BREAST LUMPECTOMY; Right     Comment:  breast ca 2007: BREAST SURGERY     Comment:  right lumpectomy 12/27/2012: COLONOSCOPY 02/14/2019: COLONOSCOPY WITH PROPOFOL; N/A     Comment:  Procedure: COLONOSCOPY WITH PROPOFOL;  Surgeon: Jonathon Bellows, MD;  Location: [facility] ENDOSCOPY;  Service:               Gastroenterology;  Laterality: N/A; 01/28/2021: EXCISION OF BREAST BIOPSY; Right     Comment:  Procedure: EXCISION OF BREAST abscess;  Surgeon:               Olean Ree, MD;  Location: [facility] ORS;  Service:               General;  Laterality: Right;  BMI    Body Mass Index: 27.62 kg/m      Reproductive/Obstetrics negative OB ROS                             Anesthesia Physical Anesthesia Plan  ASA: 3  Anesthesia Plan: General   Post-op Pain Management:    Induction: Intravenous  PONV Risk Score and Plan: 1 and Dexamethasone, Ondansetron, Midazolam and Treatment may vary due to age or medical condition  Airway Management Planned: LMA  Additional Equipment:   Intra-op Plan:   Post-operative Plan: Extubation in OR  Informed Consent: I have reviewed the patients History  and Physical, chart, labs and discussed the procedure including the risks, benefits and alternatives for the proposed anesthesia with the patient or authorized representative who has indicated his/her understanding and acceptance.     Dental Advisory Given  Plan Discussed with: CRNA and Surgeon  Anesthesia Plan Comments:         Anesthesia Quick Evaluation

## 2021-10-10 NOTE — Transfer of Care (Signed)
Immediate Anesthesia Transfer of Care Note  Patient: Adriana Berg  Procedure(s) Performed: Procedure(s): BREAST LUMPECTOMY,RADIO FREQ LOCALIZER,AXILLARY SENTINEL LYMPH NODE BIOPSY (Left)  Patient Location: PACU  Anesthesia Type:General  Level of Consciousness: sedated  Airway & Oxygen Therapy: Patient Spontanous Breathing and Patient connected to face mask oxygen  Post-op Assessment: Report given to RN and Post -op Vital signs reviewed and stable  Post vital signs: Reviewed and stable  Last Vitals:  Vitals:   10/10/21 1229 10/10/21 1600  BP: (!) 160/80 126/60  Pulse: 86 77  Resp: 18 20  Temp: 36.7 C (!) 36.3 C  SpO2: 16% 83%    Complications: No apparent anesthesia complications

## 2021-10-10 NOTE — Anesthesia Postprocedure Evaluation (Signed)
Anesthesia Post Note  Patient: Adriana Berg  Procedure(s) Performed: BREAST LUMPECTOMY,RADIO FREQ LOCALIZER,AXILLARY SENTINEL LYMPH NODE BIOPSY (Left)  Patient location during evaluation: PACU Anesthesia Type: General Level of consciousness: awake and alert Pain management: pain level controlled Vital Signs Assessment: post-procedure vital signs reviewed and stable Respiratory status: spontaneous breathing, nonlabored ventilation, respiratory function stable and patient connected to nasal cannula oxygen Cardiovascular status: blood pressure returned to baseline and stable Postop Assessment: no apparent nausea or vomiting Anesthetic complications: no   No notable events documented.   Last Vitals:  Vitals:   10/10/21 1700 10/10/21 1715  BP: (!) 115/51 131/65  Pulse: 67 81  Resp: 19 18  Temp: (!) 36.3 C (!) 36.3 C  SpO2: 95% 100%    Last Pain:  Vitals:   10/10/21 1715  TempSrc: Temporal  PainSc: 0-No pain                 Dimas Millin

## 2021-10-10 NOTE — Anesthesia Procedure Notes (Signed)
Procedure Name: LMA Insertion Date/Time: 10/10/2021 1:19 PM  Performed by: Doreen Salvage, CRNAPre-anesthesia Checklist: Patient identified, Patient being monitored, Timeout performed, Emergency Drugs available and Suction available Patient Re-evaluated:Patient Re-evaluated prior to induction Oxygen Delivery Method: Circle system utilized Preoxygenation: Pre-oxygenation with 100% oxygen Induction Type: IV induction Ventilation: Mask ventilation without difficulty LMA: LMA inserted LMA Size: 4.0 Tube type: Oral Number of attempts: 1 Placement Confirmation: positive ETCO2 and breath sounds checked- equal and bilateral Tube secured with: Tape Dental Injury: Teeth and Oropharynx as per pre-operative assessment

## 2021-10-11 ENCOUNTER — Encounter: Payer: Self-pay | Admitting: Surgery

## 2021-10-14 ENCOUNTER — Telehealth: Payer: Self-pay | Admitting: Surgery

## 2021-10-14 LAB — SURGICAL PATHOLOGY

## 2021-10-14 NOTE — Telephone Encounter (Signed)
Attempted to call pt regarding path results, Call Went to voicemail and voicemail if full

## 2021-10-14 NOTE — Telephone Encounter (Signed)
Updated pt about path results ( negative margins and negatives nodes). She is otherwise doing well and is very Patent attorney. F/U w Dr. Hampton Abbot

## 2021-10-16 ENCOUNTER — Telehealth: Payer: Self-pay

## 2021-10-16 NOTE — Telephone Encounter (Signed)
Oncotype Dx submitted online for specimen [id number].  [id number]  Insurance, facesheet and path report faxed to [phone]

## 2021-10-22 ENCOUNTER — Inpatient Hospital Stay: Payer: No Typology Code available for payment source | Attending: Oncology | Admitting: Hospice and Palliative Medicine

## 2021-10-22 DIAGNOSIS — C50919 Malignant neoplasm of unspecified site of unspecified female breast: Secondary | ICD-10-CM

## 2021-10-22 NOTE — Progress Notes (Signed)
Multidisciplinary Oncology Council Documentation  Adriana Berg was presented by our [facility] on 10/22/2021, which included representatives from:  Palliative Care Dietitian  Physical/Occupational Therapist Nurse Navigator Genetics Speech Therapist Social work Survivorship RN Financial Navigator Research RN   Adriana Berg currently presents with history of breast cancer  We reviewed previous medical and familial history, history of present illness, and recent lab results along with all available histopathologic and imaging studies. The [place] considered available treatment options and made the following recommendations/referrals:  SW, rehab screening  The MOC is a meeting of clinicians from various specialty areas who evaluate and discuss patients for whom a multidisciplinary approach is being considered. Final determinations in the plan of care are those of the provider(s).   Today's extended care, comprehensive team conference, Adriana Berg was not present for the discussion and was not examined.

## 2021-10-24 ENCOUNTER — Encounter: Payer: Self-pay | Admitting: Licensed Clinical Social Worker

## 2021-10-24 ENCOUNTER — Ambulatory Visit (INDEPENDENT_AMBULATORY_CARE_PROVIDER_SITE_OTHER): Payer: No Typology Code available for payment source | Admitting: Surgery

## 2021-10-24 ENCOUNTER — Telehealth: Payer: Self-pay | Admitting: *Deleted

## 2021-10-24 ENCOUNTER — Other Ambulatory Visit: Payer: Self-pay

## 2021-10-24 ENCOUNTER — Encounter: Payer: Self-pay | Admitting: Surgery

## 2021-10-24 VITALS — BP 144/80 | HR 67 | Temp 98.1°F | Ht 62.0 in | Wt 150.0 lb

## 2021-10-24 DIAGNOSIS — C50011 Malignant neoplasm of nipple and areola, right female breast: Secondary | ICD-10-CM

## 2021-10-24 DIAGNOSIS — C50412 Malignant neoplasm of upper-outer quadrant of left female breast: Secondary | ICD-10-CM

## 2021-10-24 DIAGNOSIS — Z09 Encounter for follow-up examination after completed treatment for conditions other than malignant neoplasm: Secondary | ICD-10-CM

## 2021-10-24 DIAGNOSIS — Z08 Encounter for follow-up examination after completed treatment for malignant neoplasm: Secondary | ICD-10-CM

## 2021-10-24 DIAGNOSIS — Z170 Estrogen receptor positive status [ER+]: Secondary | ICD-10-CM

## 2021-10-24 NOTE — Telephone Encounter (Signed)
Spoke with patient regarding upcoming appointments and the change with her appointment with Dr. Tasia Catchings.   Both appointments were moved to consolidate the trips to and from the cancer center.  Patient was ok with the change,.

## 2021-10-24 NOTE — Progress Notes (Signed)
10/24/2021  HPI: Adriana Berg is a 66 y.o. female s/p left breast SAVI [place] localized lumpectomy with sentinel lymph node biopsy on 10/10/2021 for left breast cancer.  Final pathology showed negative margins and all lymph nodes were negative.  Patient presents today for follow-up.  She reports that she has been doing well with only some soreness particular in the left axilla.  Denies any troubles with the incisions themselves.  Has been wearing her surgical bra.  Vital signs: BP (!) 144/80   Pulse 67   Temp 98.1 F (36.7 C) (Oral)   Ht '5\' 2"'$  (1.575 m)   Wt 150 lb (68 kg)   SpO2 100%   BMI 27.44 kg/m    Physical Exam: Constitutional: No acute distress Breast: Left breast status post lumpectomy in the upper outer quadrant with incision healing well with Dermabond still in place.  There is still some residual methylene blue in the upper outer quadrant which is resolving.  Left axillary incision also healing well with Dermabond still in place.  Assessment/Plan: This is a 66 y.o. female s/p left breast lumpectomy and sentinel lymph node biopsy.  - Patient currently is doing well without any evidence of complications at this point.  Incisions are clean, dry, intact.  Discussed pathology results with her again. - Patient has a follow-up appointment with Dr. Tasia Catchings on 10/31/2021.  We will also send a referral for radiation oncology with Dr. Baruch Gouty. - Follow-up with me in 3 months.   Melvyn Neth, [facility]

## 2021-10-24 NOTE — Patient Instructions (Addendum)
Referral to Radiation Oncology Dr.Chrystal. Someone from their office will contact you to schedule an appointment. If you do not hear from anyone within 5 days please call their office.  We will contact you in December  2023 to schedule an appointment with Dr.Piscoya for the end of January 2024.

## 2021-10-24 NOTE — Progress Notes (Signed)
[place] Work  Clinical Social Work was referred by medical provider for assessment of psychosocial needs.  Clinical Social Worker attempted to contact patient by phone  to offer support and assess for needs.  Csw ;eft voicemail with contact information and request for return call.     FA  Adelene Amas, [place]        P

## 2021-10-27 ENCOUNTER — Encounter: Payer: Self-pay | Admitting: Oncology

## 2021-10-27 NOTE — Telephone Encounter (Signed)
Oncotype results scanned in media.

## 2021-10-31 ENCOUNTER — Ambulatory Visit: Payer: No Typology Code available for payment source | Admitting: Oncology

## 2021-10-31 ENCOUNTER — Inpatient Hospital Stay: Payer: No Typology Code available for payment source | Admitting: Licensed Clinical Social Worker

## 2021-10-31 DIAGNOSIS — C50011 Malignant neoplasm of nipple and areola, right female breast: Secondary | ICD-10-CM

## 2021-10-31 NOTE — Progress Notes (Signed)
[place] Work  Initial Assessment   Adriana Berg is a 66 y.o. year old female contacted by phone. Clinical Social Work was referred by medical provider for assessment of psychosocial needs.   SDOH (Social Determinants of Health) assessments performed: Yes SDOH Interventions    Flowsheet Row Clinical Support from 10/31/2021 in [facility] [place] Interventions   Food Insecurity Interventions Intervention Not Indicated, Other (Comment)  [Food [place] Interventions Intervention Not Indicated  Transportation Interventions Intervention Not Indicated, Patient Resources (Friends/Family)  Utilities Interventions Intervention Not Indicated  Alcohol Usage Interventions Intervention Not Indicated (Score <7)  Financial Strain Interventions Intervention Not Indicated  Physical Activity Interventions Intervention Not Indicated  Stress Interventions Intervention Not Indicated  Social Connections Interventions Intervention Not Indicated       SDOH Screenings   Food Insecurity: No Food Insecurity (10/31/2021)  Housing: Low Risk  (10/31/2021)  Transportation Needs: No Transportation Needs (10/31/2021)  Utilities: Not At Risk (10/31/2021)  Alcohol Screen: Low Risk  (10/31/2021)  Depression (PHQ2-9): Low Risk  (10/31/2021)  Financial Resource Strain: Medium Risk (10/31/2021)  Physical Activity: Insufficiently Active (10/31/2021)  Social Connections: Moderately Isolated (10/31/2021)  Stress: No Stress Concern Present (10/31/2021)  Tobacco Use: High Risk (10/24/2021)     Distress Screen completed: No    09/26/2021    9:32 AM  ONCBCN DISTRESS SCREENING  Screening Type Initial Screening  Distress experienced in past week (1-10) 0      Family/Social Information:  Housing Arrangement: patient lives alone, Adriana Berg, Adriana Berg [phone]  Family members/support persons in your life? Family, Friends, [place], and Geophysical data processor  concerns: no  Employment: Out on work excuse , patient will return to work on Monday 11/03/2021.  Income source: No income and will return to work on 11/03/2021 Financial concerns: Yes, due to illness and/or loss of work during treatment Type of concern:  Patient does not have immediate financial concerns, but has experienced general financial hardship due to loss of work. CSW will send referral to Designer, jewellery and food assistance.   Food access concerns: no, immediate concerns, but would welcome assistance. Religious or spiritual practice: Hydrographic surveyor Currently in place:  [place], [place] Preferred  Coping/ Adjustment to diagnosis: Patient understands treatment plan and what happens next? yes Concerns about diagnosis and/or treatment: Pain or discomfort during procedures, Losing my job and/or losing income, How I will pay for the services I need, and Medication affordability and procurement concerns. Patient reported stressors: Finances, Adjusting to my illness, and medication procurement and affordability Hopes and/or priorities: To continue working during radiation. Patient enjoys being outside, time with family/ friends, and church Current coping skills/ strengths: Average or above average intelligence , Capable of independent living , Armed forces logistics/support/administrative officer , Scientist, research (life sciences) , Motivation for treatment/growth , Physical Health , Religious Affiliation , Special hobby/interest , Supportive family/friends , and Work skills     SUMMARY: Current SDOH Barriers:  Financial constraints related to overall financial concerns, patient is returning to work on 11/03/2021 and Medication procurement  Clinical Social Work Clinical Goal(s):  No clinical social work goals at this time  Interventions: Discussed common feeling and emotions when being diagnosed with cancer, and the importance of support during treatment Informed patient of the support team roles and support services at [facility] Provided  [place] contact information and encouraged patient to call with any questions or concerns Referred patient to fund manager/food assistance and Provided patient with information about CSW role in patient care  and information on calendar activities/groups and available resources.  CSW will send email with calendar information to [email or web address].   Follow Up Plan: Patient will contact CSW with any support or resource needs Patient verbalizes understanding of plan: Yes    Texie Tupou, LCSW

## 2021-11-04 ENCOUNTER — Institutional Professional Consult (permissible substitution): Payer: No Typology Code available for payment source | Admitting: Radiation Oncology

## 2021-11-04 NOTE — Progress Notes (Signed)
Called to speak with patient regarding any grants she may qualify for, left her a voicemail asking that she return my call.

## 2021-11-05 ENCOUNTER — Encounter: Payer: Self-pay | Admitting: *Deleted

## 2021-11-06 ENCOUNTER — Encounter: Payer: Self-pay | Admitting: Oncology

## 2021-11-06 ENCOUNTER — Ambulatory Visit
Admission: RE | Admit: 2021-11-06 | Discharge: 2021-11-06 | Disposition: A | Discharge status: Home / Self Care | Payer: No Typology Code available for payment source | Source: Ambulatory Visit | Attending: Radiation Oncology | Admitting: Radiation Oncology

## 2021-11-06 ENCOUNTER — Inpatient Hospital Stay: Payer: No Typology Code available for payment source | Attending: Oncology | Admitting: Oncology

## 2021-11-06 ENCOUNTER — Encounter: Payer: Self-pay | Admitting: Radiation Oncology

## 2021-11-06 VITALS — BP 111/68 | HR 66 | Temp 99.0°F | Resp 12 | Ht 62.0 in | Wt 151.0 lb

## 2021-11-06 VITALS — BP 111/68 | HR 66 | Temp 99.0°F | Resp 12 | Wt 151.0 lb

## 2021-11-06 DIAGNOSIS — Z7982 Long term (current) use of aspirin: Secondary | ICD-10-CM | POA: Diagnosis not present

## 2021-11-06 DIAGNOSIS — C50412 Malignant neoplasm of upper-outer quadrant of left female breast: Secondary | ICD-10-CM | POA: Diagnosis present

## 2021-11-06 DIAGNOSIS — Z79899 Other long term (current) drug therapy: Secondary | ICD-10-CM | POA: Diagnosis not present

## 2021-11-06 DIAGNOSIS — C50811 Malignant neoplasm of overlapping sites of right female breast: Secondary | ICD-10-CM

## 2021-11-06 DIAGNOSIS — Z853 Personal history of malignant neoplasm of breast: Secondary | ICD-10-CM | POA: Diagnosis present

## 2021-11-06 DIAGNOSIS — Z170 Estrogen receptor positive status [ER+]: Secondary | ICD-10-CM | POA: Insufficient documentation

## 2021-11-06 DIAGNOSIS — F17210 Nicotine dependence, cigarettes, uncomplicated: Secondary | ICD-10-CM | POA: Insufficient documentation

## 2021-11-06 DIAGNOSIS — Z803 Family history of malignant neoplasm of breast: Secondary | ICD-10-CM

## 2021-11-06 NOTE — Progress Notes (Signed)
Hematology/Oncology Progress note [phone] [phone]) [phone]        ASSESSMENT & PLAN:   Cancer Staging  Breast cancer [facility]) Staging form: Breast, AJCC 8th Edition - Clinical stage from 09/17/2021: Stage IA (cT1b, cN0, cM0, G1, ER+, PR+, HER2-) - Signed by Earlie Server, MD on 09/26/2021 - Pathologic stage from 10/10/2021: Stage IA (pT1b, pN0, cM0, G2, ER+, PR+, HER2-, Oncotype DX score: 17) - Signed by Earlie Server, MD on 11/06/2021   Breast cancer [facility]) pathology results were reviewed and discussed with patient. 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 Oncotype Dx recurrence score 17 No chemotherapy benefit.  Refer to Radonc for adjuvant RT Discussed about adjuvant endocrine therapy with Aromatase inhibitor. Plan to start at next visit after RT  Family history of breast cancer I have referred her to genetic counselor.   No orders of the defined types were placed in this encounter.  Follow up 2 weeks after RT- late December 2023 All questions were answered. The patient knows to call the clinic with any problems, questions or concerns.  Earlie Server, MD, PhD [facility] Hematology Oncology 11/06/2021   CHIEF COMPLAINTS/PURPOSE OF CONSULTATION:  Breast cancer  HISTORY OF PRESENTING ILLNESS:  Adriana Berg 66 y.o. female presents for follow up of breast cancer  I have reviewed her chart and materials related to her cancer extensively and collaborated history with the patient. Summary of oncologic history is as follows: Oncology History  Breast cancer ([place])  08/04/2021 Imaging   Bilateral screening mammogram In the right breast, a possible asymmetry warrants further evaluation. Interval resection of RIGHT breast dystrophic calcifications.   In the left breast, possible architectural distortion warrants further evaluation.     08/28/2021 Mammogram   Bilateral diagnostic mammogram showed 1. There is a suspicious 6 mm mass in the LEFT breast at  2 o'clock. Recommend ultrasound-guided biopsy for definitive characterization. Recommend attention on post marker placement mammogram to assess for mammographic/sonographic correlation. 2. No suspicious LEFT axillary adenopathy. 3. No mammographic evidence of malignancy in the RIGHT breast.     09/17/2021 Cancer Staging   Staging form: Breast, AJCC 8th Edition - Clinical stage from 09/17/2021: Stage IA (cT1b, cN0, cM0, G1, ER+, PR+, HER2-) - Signed by Earlie Server, MD on 09/26/2021 Stage prefix: Initial diagnosis Histologic grading system: 3 grade system   09/26/2021 Initial Diagnosis   Breast cancer ([place])  09/17/2021, left breast mass core biopsy showed invasive mammary carcinoma, no special type.  Grade 1.  DCIS present, LVI not identified, ER+ 90%, PR+ 90%, HER2 negative[score 1+]    10/10/2021 Surgery   Patient underwent left breast lumpectomy and sentinel lymph node biopsy  Pathology showed pT1b pN0 A. breast, left; lumpectomy:  - 8 mm invasive carcinoma of no special type, margins negative (see  below)  B. Sentinel node #1; excision: - No evidence of malignancy in 1 sentinel node.  C.  Sentinel node  #2; excision: - No evidence of malignancy in 2 sentinel nodes.  D.  Sentinel node  #3; excision: - No evidence of malignancy in 2 sentinel nodes.   TUMOR Histologic Type: Invasive carcinoma of no special type Histologic Grade (Nottingham Histologic Score)      Glandular (Acinar)/Tubular Differentiation: 2 (of 3)      Nuclear Pleomorphism: 3 (of 3)      Mitotic Rate: 1 (of 3)      Overall Grade: 2 (of 3) Tumor Size: 8 mm Tumor Focality: Unifocal Ductal Carcinoma In  Situ (DCIS): Present      Negative for extensive intraductal component Tumor Extent: Not applicable Lymphatic and/or Vascular Invasion: Not identified Treatment Effect in the Breast: No known presurgical therapy  MARGINS Margin Status for Invasive Carcinoma: All margins negative for invasive carcinoma.      Distance  from closest margin: 2.5 mm      Specify closest margin: Anterior  Margin Status for DCIS: All margins negative for DCIS.      Distance from DCIS to closest margin: 2.8 mm      Specify closest margin: Anterior  REGIONAL LYMPH NODES Regional Lymph Node Status: All regional lymph nodes negative for tumor      Total Number of Lymph Nodes Examined (sentinel and non-sentinel): 5       Number of Sentinel Nodes Examined: 5  DISTANT METASTASIS Distant Site(s) Involved, if applicable: Not applicable    46/02/8636 Oncotype testing   Oncotype DX recurrence score 17, distant recurrence risk at 9 years with AI or tamoxifen alone- 5%, absolute chemotherapy benefit < 1%   10/10/2021 Cancer Staging   Staging form: Breast, AJCC 8th Edition - Pathologic stage from 10/10/2021: Stage IA (pT1b, pN0, cM0, G2, ER+, PR+, HER2-, Oncotype DX score: 17) - Signed by Earlie Server, MD on 11/06/2021 Stage prefix: Initial diagnosis Multigene prognostic tests performed: Oncotype DX Recurrence score range: Greater than or equal to 11 Histologic grading system: 3 grade system    Remote stage I right breast cancer in 2007, status post resection.  Patient recalls taking adjuvant endocrine therapy for 5 years.  Details unknown.  Previous pathology report is not available in EMR.  During the interval, patient has had surgery. See onc history.  She feels well today. Presents to discuss adjuvant plan. No new complaints.     MEDICAL HISTORY:  Past Medical History:  Diagnosis Date   Borderline diabetes    Breast cancer ([place]) 2007   RT LUMPECTOMY, ER +   Lab test positive for detection of COVID-19 virus 12/2018   MRSA (methicillin resistant staph aureus) culture positive 06/2015   lower abdomen/mons pubis abscess   Personal history of malignant neoplasm of breast    invasive ductal carcinoma with tubular features,Stage 1,ER pos,HER2 neg   Personal history of radiation therapy 2007   Personal history of tobacco use,  presenting hazards to health    Pneumonia 02/2015   Radiation 2007   BREAST CA   Unspecified essential hypertension     SURGICAL HISTORY: Past Surgical History:  Procedure Laterality Date   ABDOMINAL HYSTERECTOMY  1995   AXILLARY SENTINEL NODE BIOPSY Right 2007   BREAST BIOPSY Right 2007   CORE - POS   BREAST BIOPSY Right 1995   EXCISIONAL - NEG   BREAST BIOPSY Left 09/17/2021   u/s bx, heart clip. 2:00, path pending.   BREAST LUMPECTOMY Right 2007   breast ca   BREAST LUMPECTOMY,RADIO FREQ LOCALIZER,AXILLARY SENTINEL LYMPH NODE BIOPSY Left 10/10/2021   Procedure: BREAST LUMPECTOMY,RADIO FREQ LOCALIZER,AXILLARY SENTINEL LYMPH NODE BIOPSY;  Surgeon: Olean Ree, MD;  Location: [facility] ORS;  Service: General;  Laterality: Left;   BREAST SURGERY  2007   right lumpectomy   COLONOSCOPY  12/27/2012   COLONOSCOPY WITH PROPOFOL N/A 02/14/2019   Procedure: COLONOSCOPY WITH PROPOFOL;  Surgeon: Jonathon Bellows, MD;  Location[facility] ENDOSCOPY;  Service: Gastroenterology;  Laterality: N/A;   EXCISION OF BREAST BIOPSY Right 01/28/2021   Procedure: EXCISION OF BREAST abscess;  Surgeon: Olean Ree, MD;  Location: [facility] ORS;  Service: General;  Laterality: Right;    SOCIAL HISTORY: Social History   Socioeconomic History   Marital status: Single    Spouse name: Not on file   Number of children: Not on file   Years of education: Not on file   Highest education level: Not on file  Occupational History   Not on file  Tobacco Use   Smoking status: Some Days    Packs/day: 0.25    Years: 41.00    Total pack years: 10.25    Types: Cigarettes    Passive exposure: Past   Smokeless tobacco: Never  Vaping Use   Vaping Use: Never used  Substance and Sexual Activity   Alcohol use: No   Drug use: No   Sexual activity: Not on file  Other Topics Concern   Not on file  Social History Narrative   Lives at home with son, Independent at baseline   Social Determinants of Health   Financial  Resource Strain: Medium Risk (10/31/2021)   Overall Financial Resource Strain (CARDIA)    Difficulty of Paying Living Expenses: Somewhat hard  Food Insecurity: No Food Insecurity (10/31/2021)   Hunger Vital Sign    Worried About Running Out of Food in the Last Year: Never true    [place] in the Last Year: Never true  Transportation Needs: No Transportation Needs (10/31/2021)   PRAPARE - Hydrologist (Medical): No    Lack of Transportation (Non-Medical): No  Physical Activity: Insufficiently Active (10/31/2021)   Exercise Vital Sign    Days of Exercise per Week: 2 days    Minutes of Exercise per Session: 20 min  Stress: No Stress Concern Present (10/31/2021)   [place]    Feeling of Stress : Only a little  Social Connections: Moderately Isolated (10/31/2021)   Social Connection and Isolation Panel [NHANES]    Frequency of Communication with Friends and Family: More than three times a week    Frequency of Social Gatherings with Friends and Family: More than three times a week    Attends Religious Services: More than 4 times per year    Active Member of [employer] or Organizations: No    Attends Archivist Meetings: Never    Marital Status: Never married  Intimate Partner Violence: Not At Risk (10/31/2021)   Humiliation, Afraid, Rape, and Kick questionnaire    Fear of Current or Ex-Partner: No    Emotionally Abused: No    Physically Abused: No    Sexually Abused: No    FAMILY HISTORY: Family History  Problem Relation Age of Onset   Diabetes Mellitus II Mother    CAD Father    Hypertension Sister    Diabetes Sister    Cancer Sister    Breast cancer Sister 55   Stroke Brother     ALLERGIES:  is allergic to azithromycin.  MEDICATIONS:  Current Outpatient Medications  Medication Sig Dispense Refill   acetaminophen (TYLENOL) 500 MG tablet Take 2 tablets (1,000 mg  total) by mouth every 6 (six) hours as needed for mild pain.     amLODipine (NORVASC) 2.5 MG tablet Take 2.5 mg by mouth every morning.     aspirin EC 81 MG tablet Take 81 mg by mouth daily. Swallow whole.     cholecalciferol (VITAMIN D) 25 MCG (1000 UNIT) tablet Take 1,000 Units by mouth daily.     ibuprofen (ADVIL) 600 MG tablet  Take 1 tablet (600 mg total) by mouth every 8 (eight) hours as needed for moderate pain. 60 tablet 1   lisinopril (ZESTRIL) 40 MG tablet Take 40 mg by mouth every morning.     oxyCODONE (OXY IR/ROXICODONE) 5 MG immediate release tablet Take 1 tablet (5 mg total) by mouth every 4 (four) hours as needed for severe pain. 30 tablet 0   rosuvastatin (CRESTOR) 20 MG tablet Take 20 mg by mouth at bedtime.     spironolactone (ALDACTONE) 50 MG tablet Take 50 mg by mouth daily.      lidocaine-prilocaine (EMLA) cream Apply to the left areola and cover with plastic wrap one hour prior to leaving for surgery. (Patient not taking: Reported on 11/06/2021) 5 g 0   No current facility-administered medications for this visit.    Review of Systems  Constitutional:  Negative for appetite change, chills, fatigue and fever.  HENT:   Negative for hearing loss and voice change.   Eyes:  Negative for eye problems.  Respiratory:  Negative for chest tightness and cough.   Cardiovascular:  Negative for chest pain.  Gastrointestinal:  Negative for abdominal distention, abdominal pain and blood in stool.  Endocrine: Negative for hot flashes.  Genitourinary:  Negative for difficulty urinating and frequency.   Musculoskeletal:  Negative for arthralgias.  Skin:  Negative for itching and rash.  Neurological:  Negative for extremity weakness.  Hematological:  Negative for adenopathy.  Psychiatric/Behavioral:  Negative for confusion.      PHYSICAL EXAMINATION: ECOG PERFORMANCE STATUS: 0 - Asymptomatic  Vitals:   11/06/21 1039  BP: 111/68  Pulse: 66  Resp: 12  Temp: 99 F (37.2 C)    Filed Weights   11/06/21 1039  Weight: 151 lb (68.5 kg)    Physical Exam Constitutional:      General: She is not in acute distress.    Appearance: She is not diaphoretic.  HENT:     Head: Normocephalic and atraumatic.     Nose: Nose normal.     Mouth/Throat:     Pharynx: No oropharyngeal exudate.  Eyes:     General: No scleral icterus.    Pupils: Pupils are equal, round, and reactive to light.  Cardiovascular:     Rate and Rhythm: Normal rate and regular rhythm.     Heart sounds: No murmur heard. Pulmonary:     Effort: Pulmonary effort is normal. No respiratory distress.     Breath sounds: No rales.  Chest:     Chest wall: No tenderness.  Abdominal:     General: There is no distension.     Palpations: Abdomen is soft.     Tenderness: There is no abdominal tenderness.  Musculoskeletal:        General: Normal range of motion.     Cervical back: Normal range of motion and neck supple.  Skin:    General: Skin is warm and dry.     Findings: No erythema.  Neurological:     Mental Status: She is alert and oriented to person, place, and time.     Cranial Nerves: No cranial nerve deficit.     Motor: No abnormal muscle tone.     Coordination: Coordination normal.  Psychiatric:        Mood and Affect: Mood and affect normal.       LABORATORY DATA:  I have reviewed the data as listed    Latest Ref Rng & Units 09/26/2021   10:03 AM 01/22/2021  8:35 AM 06/17/2017   11:49 AM  CBC  WBC 4.0 - 10.5 K/uL 6.0  4.9  5.7   Hemoglobin 12.0 - 15.0 g/dL 15.1  15.0  14.9   Hematocrit 36.0 - 46.0 % 44.7  43.8  43.8   Platelets 150 - 400 K/uL 191  208  194       Latest Ref Rng & Units 09/26/2021   10:03 AM 01/22/2021    8:35 AM 06/17/2017   11:49 AM  CMP  Glucose 70 - 99 mg/dL 100  86  101   BUN 8 - 23 mg/dL _0 Creatinine 0.44 - 1.00 mg/dL 0.86  0.96  0.82   Sodium 135 - 145 mmol/L 140  135  139   Potassium 3.5 - 5.1 mmol/L 4.3  4.3  4.0   Chloride 98 - 111 mmol/L  105  103  103   CO2 22 - 32 mmol/L _1 Calcium 8.9 - 10.3 mg/dL 9.3  9.3  9.1   Total Protein 6.5 - 8.1 g/dL 8.1     Total Bilirubin 0.3 - 1.2 mg/dL 0.5     Alkaline Phos 38 - 126 U/L 56     AST 15 - 41 U/L 14     ALT 0 - 44 U/L 12          RADIOGRAPHIC STUDIES: I have personally reviewed the radiological images as listed and agreed with the findings in the report. MM Breast Surgical Specimen  Result Date: 10/10/2021 CLINICAL DATA:  Status post [facility] localized left breast lumpectomy. EXAM: SPECIMEN RADIOGRAPH OF THE left BREAST COMPARISON:  Previous exam(s). FINDINGS: Status post excision of the left breast. [facility] reflector and heart shaped clip are present within the specimen. IMPRESSION: Specimen radiograph of the left breast. Electronically Signed   By: Kristopher Oppenheim M.D.   On: 10/10/2021 15:23  NM Sentinel Node Inj-No Rpt (Breast)  Result Date: 10/10/2021 Sulfur Colloid was injected by the Nuclear Medicine Technologist for sentinel lymph node localization.   MM LT RADIO FREQUENCY TAG LOC MAMMO GUIDE  Result Date: 10/01/2021 CLINICAL DATA:  66 year old with a biopsy-proven invasive mammary carcinoma of no special type involving the UPPER OUTER QUADRANT of the LEFT breast. SAVI SCOUT localization is performed in anticipation of lumpectomy which is scheduled for 10/10/2021. EXAM: NEEDLE LOCALIZATION OF THE LEFT BREAST WITH MAMMOGRAPHIC TOMOSYNTHESIS GUIDANCE COMPARISON:  Previous exam(s). FINDINGS: Patient presents for needle localization prior to LEFT breast lumpectomy. I met with the patient and we discussed the procedure of needle localization including benefits and alternatives. We discussed the high likelihood of a successful procedure. We discussed the risks of the procedure, including infection, bleeding, tissue injury, and further surgery. Informed, written consent was given. The usual time-out protocol was performed immediately prior to the procedure. Using  mammographic tomosynthesis guidance, sterile technique with chlorhexidine as skin antisepsis, 1% lidocaine as local anesthesia, a 10 cm cm SAVI SCOUT needle was used to localize the heart shaped tissue marking clip associated with the mass and distortion using a superior approach. Reflector function was confirmed with an auditory signal from the SAVI SCOUT guide. Follow-up full field CC and mediolateral mammographic images were obtained to confirm placement. The SAVI SCOUT device is appropriately positioned immediately adjacent to the heart shaped clip and within the architectural distortion. The images were marked for Dr. Hampton Abbot. IMPRESSION: SAVI SCOUT radar reflector localization of the LEFT breast. No apparent complications.  Electronically Signed   By: Evangeline Dakin M.D.   On: 10/01/2021 16:27  [place] LT BREAST BX W LOC DEV 1ST LESION IMG BX SPEC US GUIDE  Addendum Date: 09/18/2021   ADDENDUM REPORT: 09/18/2021 14:45 ADDENDUM: PATHOLOGY revealed: BREAST, LEFT, MASS; CORE BIOPSIES: - INVASIVE MAMMARY CARCINOMA, NO SPECIAL TYPE. Size of invasive carcinoma: 5 mm in this sample. Grade 1. Ductal carcinoma in situ: Present. Lymphovascular invasion: Not identified. Pathology results are CONCORDANT with imaging findings, per Dr. Fidela Salisbury. Pathology results and recommendations were discussed with patient via telephone on 09/18/2021. Patient reported biopsy site doing well with no adverse symptoms, and only slight tenderness at the site. Post biopsy care instructions were reviewed, questions were answered and my direct phone number was provided. Patient was instructed to call [facility] for any additional questions or concerns related to biopsy site. RECOMMENDATIONS: Surgical consultation. Request for surgical consultation relayed to Casper Harrison RN at [facility] by Electa Sniff RN on 09/18/2021. Pathology results reported by Electa Sniff RN on 09/18/2021. Electronically Signed   By:  Fidela Salisbury M.D.   On: 09/18/2021 14:45   Result Date: 09/18/2021 CLINICAL DATA:  Left breast 2 o'clock mass. EXAM: ULTRASOUND GUIDED LEFT BREAST CORE NEEDLE BIOPSY COMPARISON:  Previous exam(s). PROCEDURE: I met with the patient and we discussed the procedure of ultrasound-guided biopsy, including benefits and alternatives. We discussed the high likelihood of a successful procedure. We discussed the risks of the procedure, including infection, bleeding, tissue injury, clip migration, and inadequate sampling. Informed written consent was given. The usual time-out protocol was performed immediately prior to the procedure. Lesion quadrant: Upper outer quadrant Using sterile technique and 1% Lidocaine as local anesthetic, under direct ultrasound visualization, a 14 gauge spring-loaded device was used to perform biopsy of left breast 2 o'clock mass using a inferior approach. At the conclusion of the procedure heart shaped tissue marker clip was deployed into the biopsy cavity. Follow up 2 view mammogram was performed and dictated separately. IMPRESSION: Ultrasound guided biopsy of the left breast. No apparent complications. Electronically Signed: By: Fidela Salisbury M.D. On: 09/17/2021 10:11   MM CLIP PLACEMENT LEFT  Result Date: 09/17/2021 CLINICAL DATA:  Post ultrasound-guided core needle biopsy of left breast 2 o'clock mass. EXAM: 3D DIAGNOSTIC LEFT MAMMOGRAM POST ULTRASOUND BIOPSY COMPARISON:  Previous exam(s). FINDINGS: 3D Mammographic images were obtained following ultrasound guided biopsy of left breast 2 o'clock. The biopsy marking clip is in expected position at the site of biopsy. IMPRESSION: Appropriate positioning of the heart shaped biopsy marking clip at the site of biopsy in the left breast 2 o'clock. Final Assessment: Post Procedure Mammograms for Marker Placement Electronically Signed   By: Fidela Salisbury M.D.   On: 09/17/2021 10:17  MM DIAG BREAST TOMO BILATERAL  Result Date:  08/28/2021 CLINICAL DATA:  RIGHT lumpectomy in 2007 with a excision of a RIGHT breast abscess in 2023. callback for RIGHT breast asymmetry and LEFT breast distortion EXAM: DIGITAL DIAGNOSTIC BILATERAL MAMMOGRAM WITH TOMOSYNTHESIS; ULTRASOUND LEFT BREAST LIMITED TECHNIQUE: Bilateral digital diagnostic mammography and breast tomosynthesis was performed.; Targeted ultrasound examination of the left breast was performed. COMPARISON:  Previous exam(s). ACR Breast Density Category b: There are scattered areas of fibroglandular density. FINDINGS: The previously described finding in the RIGHT breast does not persist with additional views, consistent with superimposed fibroglandular tissue. Stable postsurgical changes are noted. Diagnostic images of the LEFT breast confirm persistence of an irregular spiculated mass with associated distortion in the LEFT  upper outer breast at middle depth. It is best seen on ML slice 57, spot CC slice 27 and a likely correlate on MLO slice 50. Targeted ultrasound was performed LEFT upper outer breast. At 2 o'clock 5 cm from the nipple, there is an irregular hypoechoic mass with irregular margins. It measures 5 x 6 x 4 mm. This corresponds to the site of screening mammographic concern. Targeted ultrasound was performed of the LEFT axilla. No suspicious axillary lymph nodes are visualized. IMPRESSION: 1. There is a suspicious 6 mm mass in the LEFT breast at 2 o'clock. Recommend ultrasound-guided biopsy for definitive characterization. Recommend attention on post marker placement mammogram to assess for mammographic/sonographic correlation. 2. No suspicious LEFT axillary adenopathy. 3. No mammographic evidence of malignancy in the RIGHT breast. RECOMMENDATION: LEFT breast ultrasound-guided biopsy x1 I have discussed the findings and recommendations with the patient. The biopsy procedure was discussed with the patient and questions were answered. Patient expressed their understanding of the  biopsy recommendation. Patient will be scheduled for biopsy at her earliest convenience by the schedulers. Ordering provider will be notified. If applicable, a reminder letter will be sent to the patient regarding the next appointment. BI-RADS CATEGORY  4: Suspicious. Electronically Signed   By: Valentino Saxon M.D.   On: 08/28/2021 12:22  US BREAST LTD UNI LEFT INC AXILLA  Result Date: 08/28/2021 CLINICAL DATA:  RIGHT lumpectomy in 2007 with a excision of a RIGHT breast abscess in 2023. callback for RIGHT breast asymmetry and LEFT breast distortion EXAM: DIGITAL DIAGNOSTIC BILATERAL MAMMOGRAM WITH TOMOSYNTHESIS; ULTRASOUND LEFT BREAST LIMITED TECHNIQUE: Bilateral digital diagnostic mammography and breast tomosynthesis was performed.; Targeted ultrasound examination of the left breast was performed. COMPARISON:  Previous exam(s). ACR Breast Density Category b: There are scattered areas of fibroglandular density. FINDINGS: The previously described finding in the RIGHT breast does not persist with additional views, consistent with superimposed fibroglandular tissue. Stable postsurgical changes are noted. Diagnostic images of the LEFT breast confirm persistence of an irregular spiculated mass with associated distortion in the LEFT upper outer breast at middle depth. It is best seen on ML slice 57, spot CC slice 27 and a likely correlate on MLO slice 50. Targeted ultrasound was performed LEFT upper outer breast. At 2 o'clock 5 cm from the nipple, there is an irregular hypoechoic mass with irregular margins. It measures 5 x 6 x 4 mm. This corresponds to the site of screening mammographic concern. Targeted ultrasound was performed of the LEFT axilla. No suspicious axillary lymph nodes are visualized. IMPRESSION: 1. There is a suspicious 6 mm mass in the LEFT breast at 2 o'clock. Recommend ultrasound-guided biopsy for definitive characterization. Recommend attention on post marker placement mammogram to assess for  mammographic/sonographic correlation. 2. No suspicious LEFT axillary adenopathy. 3. No mammographic evidence of malignancy in the RIGHT breast. RECOMMENDATION: LEFT breast ultrasound-guided biopsy x1 I have discussed the findings and recommendations with the patient. The biopsy procedure was discussed with the patient and questions were answered. Patient expressed their understanding of the biopsy recommendation. Patient will be scheduled for biopsy at her earliest convenience by the schedulers. Ordering provider will be notified. If applicable, a reminder letter will be sent to the patient regarding the next appointment. BI-RADS CATEGORY  4: Suspicious. Electronically Signed   By: Valentino Saxon M.D.   On: 08/28/2021 12:22

## 2021-11-06 NOTE — Assessment & Plan Note (Signed)
I have referred her to genetic counselor.

## 2021-11-06 NOTE — Assessment & Plan Note (Addendum)
pathology results were reviewed and discussed with patient. 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 Oncotype Dx recurrence score 17 No chemotherapy benefit.  Refer to Radonc for adjuvant RT Discussed about adjuvant endocrine therapy with Aromatase inhibitor. Plan to start at next visit after RT

## 2021-11-06 NOTE — Consult Note (Signed)
NEW PATIENT EVALUATION  Name: Adriana Berg  MRN: [id number]  Date:   11/06/2021     DOB: 1955/10/08   This 66 y.o. female patient presents to the clinic for initial evaluation of stage Ia (T1b N0 M0) ER/PR positive invasive mammary carcinoma in the left breast and patient prior treated over 15 years prior to her right breast.  REFERRING PHYSICIAN: Denton Lank, MD  CHIEF COMPLAINT:  Chief Complaint  Patient presents with   Breast Cancer    DIAGNOSIS: The encounter diagnosis was Malignant neoplasm of areola of left breast in female, estrogen receptor positive ([place]).   PREVIOUS INVESTIGATIONS:  Mammogram and ultrasound reviewed Pathology reports reviewed Clinical notes reviewed  HPI: Patient is a 66 year old female who presented with an abnormal mammogram of her left breast showing suspicious 6 mm mass in the left breast at the 2 o'clock position.  There is was no suspicious left axillary adenopathy by ultrasound.  This prompted ultrasound-guided biopsy which was positive for invasive mammary carcinoma.  Tumor was ER/PR positive HER2/neu not overexpressed.  She went on to have a wide local excision for an overall grade two 8 mm invasive mammary carcinoma.  5 sentinel lymph nodes were all negative for metastatic disease.  Margins were clear at 2.5 mm for the invasive component and a 2.8 mm for the DCIS component.  She had an Oncotype DX showing low benefit for chemotherapy.  She is seen today for radiation oncology opinion she is doing well.  She specifically denies breast tenderness cough or bone pain.  PLANNED TREATMENT REGIMEN: Hypofractionated left whole breast radiation  PAST MEDICAL HISTORY:  has a past medical history of Borderline diabetes, Breast cancer ([place]) (2007), Lab test positive for detection of COVID-19 virus (12/2018), MRSA (methicillin resistant staph aureus) culture positive (06/2015), Personal history of malignant neoplasm of breast, Personal history of radiation therapy  (2007), Personal history of tobacco use, presenting hazards to health, Pneumonia (02/2015), Radiation (2007), and Unspecified essential hypertension.    PAST SURGICAL HISTORY:  Past Surgical History:  Procedure Laterality Date   ABDOMINAL HYSTERECTOMY  1995   AXILLARY SENTINEL NODE BIOPSY Right 2007   BREAST BIOPSY Right 2007   CORE - POS   BREAST BIOPSY Right 1995   EXCISIONAL - NEG   BREAST BIOPSY Left 09/17/2021   u/s bx, heart clip. 2:00, path pending.   BREAST LUMPECTOMY Right 2007   breast ca   BREAST LUMPECTOMY,RADIO FREQ LOCALIZER,AXILLARY SENTINEL LYMPH NODE BIOPSY Left 10/10/2021   Procedure: BREAST LUMPECTOMY,RADIO FREQ LOCALIZER,AXILLARY SENTINEL LYMPH NODE BIOPSY;  Surgeon: Olean Ree, MD;  Location: [facility] ORS;  Service: General;  Laterality: Left;   BREAST SURGERY  2007   right lumpectomy   COLONOSCOPY  12/27/2012   COLONOSCOPY WITH PROPOFOL N/A 02/14/2019   Procedure: COLONOSCOPY WITH PROPOFOL;  Surgeon: Jonathon Bellows, MD;  Location: [facility]COPY;  Service: Gastroenterology;  Laterality: N/A;   EXCISION OF BREAST BIOPSY Right 01/28/2021   Procedure: EXCISION OF BREAST abscess;  Surgeon: Olean Ree, MD;  Location: [facility] ORS;  Service: General;  Laterality: Right;    FAMILY HISTORY: family history includes Breast cancer (age of onset: 14) in her sister; CAD in her father; Cancer in her sister; Diabetes in her sister; Diabetes Mellitus II in her mother; Hypertension in her sister; Stroke in her brother.  SOCIAL HISTORY:  reports that she has been smoking cigarettes. She has a 10.25 pack-year smoking history. She has been exposed to tobacco smoke. She has never used smokeless tobacco. She  reports that she does not drink alcohol and does not use drugs.  ALLERGIES: Azithromycin  MEDICATIONS:  Current Outpatient Medications  Medication Sig Dispense Refill   acetaminophen (TYLENOL) 500 MG tablet Take 2 tablets (1,000 mg total) by mouth every 6 (six) hours as needed for  mild pain.     amLODipine (NORVASC) 2.5 MG tablet Take 2.5 mg by mouth every morning.     aspirin EC 81 MG tablet Take 81 mg by mouth daily. Swallow whole.     cholecalciferol (VITAMIN D) 25 MCG (1000 UNIT) tablet Take 1,000 Units by mouth daily.     ibuprofen (ADVIL) 600 MG tablet Take 1 tablet (600 mg total) by mouth every 8 (eight) hours as needed for moderate pain. 60 tablet 1   oxyCODONE (OXY IR/ROXICODONE) 5 MG immediate release tablet Take 1 tablet (5 mg total) by mouth every 4 (four) hours as needed for severe pain. 30 tablet 0   rosuvastatin (CRESTOR) 20 MG tablet Take 20 mg by mouth at bedtime.     spironolactone (ALDACTONE) 50 MG tablet Take 50 mg by mouth daily.      lidocaine-prilocaine (EMLA) cream Apply to the left areola and cover with plastic wrap one hour prior to leaving for surgery. (Patient not taking: Reported on 11/06/2021) 5 g 0   lisinopril (ZESTRIL) 40 MG tablet Take 40 mg by mouth every morning.     No current facility-administered medications for this encounter.    ECOG PERFORMANCE STATUS:  0 - Asymptomatic  REVIEW OF SYSTEMS: Patient denies any weight loss, fatigue, weakness, fever, chills or night sweats. Patient denies any loss of vision, blurred vision. Patient denies any ringing  of the ears or hearing loss. No irregular heartbeat. Patient denies heart murmur or history of fainting. Patient denies any chest pain or pain radiating to her upper extremities. Patient denies any shortness of breath, difficulty breathing at night, cough or hemoptysis. Patient denies any swelling in the lower legs. Patient denies any nausea vomiting, vomiting of blood, or coffee ground material in the vomitus. Patient denies any stomach pain. Patient states has had normal bowel movements no significant constipation or diarrhea. Patient denies any dysuria, hematuria or significant nocturia. Patient denies any problems walking, swelling in the joints or loss of balance. Patient denies any skin  changes, loss of hair or loss of weight. Patient denies any excessive worrying or anxiety or significant depression. Patient denies any problems with insomnia. Patient denies excessive thirst, polyuria, polydipsia. Patient denies any swollen glands, patient denies easy bruising or easy bleeding. Patient denies any recent infections, allergies or URI. Patient "s visual fields have not changed significantly in recent time.   PHYSICAL EXAM: BP 111/68 (BP Location: Right Arm, Patient Position: Sitting, Cuff Size: Normal)   Pulse 66   Temp 99 F (37.2 C) (Tympanic)   Resp 12   Ht _0  (1.575 m) Comment: Stated HT  Wt 151 lb (68.5 kg)   BMI 27.62 kg/m  Right breast shows evidence of prior surgery as well as radiation changes.  Left breast has a well-healed wide local excision scar.  No dominant masses noted in either breast.  No axillary or supraclavicular adenopathy is appreciated.  Well-developed well-nourished patient in NAD. HEENT reveals PERLA, EOMI, discs not visualized.  Oral cavity is clear. No oral mucosal lesions are identified. Neck is clear without evidence of cervical or supraclavicular adenopathy. Lungs are clear to A&P. Cardiac examination is essentially unremarkable with regular rate and rhythm without murmur  rub or thrill. Abdomen is benign with no organomegaly or masses noted. Motor sensory and DTR levels are equal and symmetric in the upper and lower extremities. Cranial nerves II through XII are grossly intact. Proprioception is intact. No peripheral adenopathy or edema is identified. No motor or sensory levels are noted. Crude visual fields are within normal range.  LABORATORY DATA: Pathology reports reviewed    RADIOLOGY RESULTS: Met [place] and ultrasound reviewed compatible with above-stated findings   IMPRESSION: Stage Ia invasive mammary carcinoma of the left breast status post wide local excision ER/PR positive in 66 year old female with prior history of lumpectomy to her  right breast and adjuvant radiation therapy.  PLAN: This time I have recommended hypofractionated course of radiation to her left breast over 3 weeks.  We will use breath-hold technique to try to minimize the radiation to her heart.  Also boost her scar another 1000 cGy using electron beam or photon therapy depending on lumpectomy cavity.  Risks and benefits of treatment including skin reaction fatigue alteration of blood counts possible inclusion of superficial lung all were discussed in detail with the patient.  She comprehends my treatment plan well.  I have personally set up and ordered CT simulation for next week.  I would like to take this opportunity to thank you for allowing me to participate in the care of your patient.Noreene Filbert, MD

## 2021-11-06 NOTE — Progress Notes (Signed)
Pt here for follow up. No new concerns voiced.   

## 2021-11-12 ENCOUNTER — Inpatient Hospital Stay: Payer: No Typology Code available for payment source | Admitting: Occupational Therapy

## 2021-11-12 DIAGNOSIS — L905 Scar conditions and fibrosis of skin: Secondary | ICD-10-CM

## 2021-11-12 NOTE — Therapy (Signed)
[facility] at [facility] [place], [place], [place], [address] Phone: [phone]   Fax:  [phone]  Occupational Therapy Screen:  Patient Details  Name: Adriana Berg MRN: [id number] Date of Birth: September 08, 1955 No data recorded  Encounter Date: 11/12/2021   OT End of Session - 11/12/21 0900     Visit Number 0             Past Medical History:  Diagnosis Date   Borderline diabetes    Breast cancer ([place]) 2007   RT LUMPECTOMY, ER +   Lab test positive for detection of COVID-19 virus 12/2018   MRSA (methicillin resistant staph aureus) culture positive 06/2015   lower abdomen/mons pubis abscess   Personal history of malignant neoplasm of breast    invasive ductal carcinoma with tubular features,Stage 1,ER pos,HER2 neg   Personal history of radiation therapy 2007   Personal history of tobacco use, presenting hazards to health    Pneumonia 02/2015   Radiation 2007   BREAST CA   Unspecified essential hypertension     Past Surgical History:  Procedure Laterality Date   ABDOMINAL HYSTERECTOMY  1995   AXILLARY SENTINEL NODE BIOPSY Right 2007   BREAST BIOPSY Right 2007   CORE - POS   BREAST BIOPSY Right 1995   EXCISIONAL - NEG   BREAST BIOPSY Left 09/17/2021   u/s bx, heart clip. 2:00, path pending.   BREAST LUMPECTOMY Right 2007   breast ca   BREAST LUMPECTOMY,RADIO FREQ LOCALIZER,AXILLARY SENTINEL LYMPH NODE BIOPSY Left 10/10/2021   Procedure: BREAST LUMPECTOMY,RADIO FREQ LOCALIZER,AXILLARY SENTINEL LYMPH NODE BIOPSY;  Surgeon: Olean Ree, MD;  Location: [facility] ORS;  Service: General;  Laterality: Left;   BREAST SURGERY  2007   right lumpectomy   COLONOSCOPY  12/27/2012   COLONOSCOPY WITH PROPOFOL N/A 02/14/2019   Procedure: COLONOSCOPY WITH PROPOFOL;  Surgeon: Jonathon Bellows, MD;  Location: [facility] ENDOSCOPY;  Service: Gastroenterology;  Laterality: N/A;   EXCISION OF BREAST BIOPSY Right 01/28/2021   Procedure: EXCISION  OF BREAST abscess;  Surgeon: Olean Ree, MD;  Location: [facility] ORS;  Service: General;  Laterality: Right;    There were no vitals filed for this visit.   Subjective Assessment - 11/12/21 0859     Subjective  I am so glad I came I thought I just had to live with my breast being hurting since surgery - even the R one after the cyst was removed    Currently in Pain? Yes    Pain Score 8     Pain Location Breast    Pain Orientation Right   L 3/10   Pain Descriptors / Indicators Sore;Tender    Pain Type Surgical pain    Pain Onset More than a month ago            DR YU visit 11/06/21: 10/10/2021 Surgery    Patient underwent left breast lumpectomy and sentinel lymph node biopsy   Pathology showed pT1b pN0 A. breast, left; lumpectomy:  - 8 mm invasive carcinoma of no special type, margins negative (see  below)  B. Sentinel node #1; excision: - No evidence of malignancy in 1 sentinel node.  C.  Sentinel node  #2; excision: - No evidence of malignancy in 2 sentinel nodes.  D.  Sentinel node  #3; excision: - No evidence of malignancy in 2 sentinel nodes.    TUMOR Histologic Type: Invasive carcinoma of no special type Histologic Grade (Nottingham Histologic Score)  Glandular (Acinar)/Tubular Differentiation: 2 (of 3)      Nuclear Pleomorphism: 3 (of 3)      Mitotic Rate: 1 (of 3)      Overall Grade: 2 (of 3) Tumor Size: 8 mm Tumor Focality: Unifocal Ductal Carcinoma In Situ (DCIS): Present      Negative for extensive intraductal component Tumor Extent: Not applicable Lymphatic and/or Vascular Invasion: Not identified Treatment Effect in the Breast: No known presurgical therapy  MARGINS Margin Status for Invasive Carcinoma: All margins negative for invasive carcinoma.      Distance from closest margin: 2.5 mm      Specify closest margin: Anterior  Margin Status for DCIS: All margins negative for DCIS.      Distance from DCIS to closest margin: 2.8 mm      Specify  closest margin: Anterior  REGIONAL LYMPH NODES Regional Lymph Node Status: All regional lymph nodes negative for tumor      Total Number of Lymph Nodes Examined (sentinel and non-sentinel): 5       Number of Sentinel Nodes Examined: 5  DISTANT METASTASIS Distant Site(s) Involved, if applicable: Not applicable     pathology results were reviewed and discussed with patient. 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 Oncotype Dx recurrence score 17 No chemotherapy benefit.  Refer to Radonc for adjuvant RT Discussed about adjuvant endocrine therapy with Aromatase inhibitor. Plan to start at next visit after RT                     OT SCREEN 11/12/21: Patient present at OT screen about 4 weeks post left lumpectomy and sentinel lymph nodes removed.  Margins was negative.  Patient with 2 scars on the left -1 in axilla  and 1 at 2:00.  Dry scabs/glue came off and patient was educated on scar massage - tenderness on the left  breast was a 4/10 with hardness and scar tissue.  Some gentle scar massage  and mobilization was done with great results. Patient reported she had a cyst removed in January on the right breast at 11:00.  Report soreness and tenderness a 8/10.  Unable to hug or lay on stomach on both breasts.  Soft tissue and scar massage done on the right breast to using the mini massager.  Again with great results. Both breasts became very soft less pain and soreness and tenderness.  At the end of session 1/10. Patient educated to do this for another week or 2 in the morning in the evening about 2 minutes on each breast. But need to stop after about the first week of radiation. Patient can follow-up with me after radiation if needed. Bilateral active range of motion in bilateral shoulders within normal limits.                Visit Diagnosis: Scar condition and fibrosis of skin    Problem List Patient Active Problem List   Diagnosis  Date Noted   Breast cancer ([place]) 09/26/2021   Goals of care, counseling/discussion 09/26/2021   Family history of breast cancer 09/26/2021   Mass of upper outer quadrant of right breast    Neck pain 11/15/2017   Numbness and tingling in left arm 11/15/2017   Tobacco abuse 01/30/2015   CAP (community acquired pneumonia) 01/27/2015   SIRS (systemic inflammatory response syndrome) ([place]) 01/27/2015   HTN (hypertension) 01/27/2015   Acute wrist pain 07/26/2013   History of breast cancer 10/27/2012   Personal  history of colonic polyps 10/27/2012    Rosalyn Gess, OTR/L,CLT 11/12/2021, 9:01 AM  [facility] at [facility] [place], [place], [place], [address] Phone: [phone]   Fax:  [phone]  Name: Adriana Berg MRN: [id number] Date of Birth: 09/20/1955

## 2021-11-13 ENCOUNTER — Ambulatory Visit
Admission: RE | Admit: 2021-11-13 | Discharge: 2021-11-13 | Disposition: A | Discharge status: Home / Self Care | Payer: No Typology Code available for payment source | Source: Ambulatory Visit | Attending: Radiation Oncology | Admitting: Radiation Oncology

## 2021-11-13 DIAGNOSIS — Z510 Encounter for antineoplastic radiation therapy: Secondary | ICD-10-CM | POA: Diagnosis present

## 2021-11-13 DIAGNOSIS — Z170 Estrogen receptor positive status [ER+]: Secondary | ICD-10-CM | POA: Insufficient documentation

## 2021-11-13 DIAGNOSIS — C50412 Malignant neoplasm of upper-outer quadrant of left female breast: Secondary | ICD-10-CM | POA: Diagnosis present

## 2021-11-14 ENCOUNTER — Encounter: Payer: Self-pay | Admitting: *Deleted

## 2021-11-14 NOTE — Progress Notes (Signed)
Called to speak with patient regarding the Adriana Berg and [employer], had to leave a voicemail asking patient to return my call.

## 2021-11-17 DIAGNOSIS — Z510 Encounter for antineoplastic radiation therapy: Secondary | ICD-10-CM | POA: Diagnosis not present

## 2021-11-24 ENCOUNTER — Ambulatory Visit
Admission: RE | Admit: 2021-11-24 | Discharge: 2021-11-24 | Disposition: A | Discharge status: Home / Self Care | Payer: No Typology Code available for payment source | Source: Ambulatory Visit | Attending: Radiation Oncology | Admitting: Radiation Oncology

## 2021-11-24 DIAGNOSIS — Z510 Encounter for antineoplastic radiation therapy: Secondary | ICD-10-CM | POA: Diagnosis not present

## 2021-11-25 ENCOUNTER — Other Ambulatory Visit: Payer: Self-pay

## 2021-11-25 ENCOUNTER — Ambulatory Visit
Admission: RE | Admit: 2021-11-25 | Discharge: 2021-11-25 | Disposition: A | Discharge status: Home / Self Care | Payer: No Typology Code available for payment source | Source: Ambulatory Visit | Attending: Radiation Oncology | Admitting: Radiation Oncology

## 2021-11-25 DIAGNOSIS — Z510 Encounter for antineoplastic radiation therapy: Secondary | ICD-10-CM | POA: Diagnosis not present

## 2021-11-25 LAB — RAD ONC ARIA SESSION SUMMARY
Course Elapsed Days: 0
Plan Fractions Treated to Date: 1
Plan Prescribed Dose Per Fraction: 2.66 Gy
Plan Total Fractions Prescribed: 16
Plan Total Prescribed Dose: 42.56 Gy
Reference Point Dosage Given to Date: 2.66 Gy
Reference Point Session Dosage Given: 2.66 Gy
Session Number: 1

## 2021-11-26 ENCOUNTER — Other Ambulatory Visit: Payer: Self-pay

## 2021-11-26 ENCOUNTER — Ambulatory Visit
Admission: RE | Admit: 2021-11-26 | Discharge: 2021-11-26 | Disposition: A | Discharge status: Home / Self Care | Payer: No Typology Code available for payment source | Source: Ambulatory Visit | Attending: Radiation Oncology | Admitting: Radiation Oncology

## 2021-11-26 DIAGNOSIS — Z510 Encounter for antineoplastic radiation therapy: Secondary | ICD-10-CM | POA: Diagnosis not present

## 2021-11-26 LAB — RAD ONC ARIA SESSION SUMMARY
Course Elapsed Days: 1
Plan Fractions Treated to Date: 2
Plan Prescribed Dose Per Fraction: 2.66 Gy
Plan Total Fractions Prescribed: 16
Plan Total Prescribed Dose: 42.56 Gy
Reference Point Dosage Given to Date: 5.32 Gy
Reference Point Session Dosage Given: 2.66 Gy
Session Number: 2

## 2021-12-01 ENCOUNTER — Ambulatory Visit: Payer: No Typology Code available for payment source

## 2021-12-01 ENCOUNTER — Other Ambulatory Visit: Payer: Self-pay | Admitting: *Deleted

## 2021-12-01 DIAGNOSIS — C50012 Malignant neoplasm of nipple and areola, left female breast: Secondary | ICD-10-CM

## 2021-12-02 ENCOUNTER — Ambulatory Visit
Admission: RE | Admit: 2021-12-02 | Discharge: 2021-12-02 | Disposition: A | Discharge status: Home / Self Care | Payer: No Typology Code available for payment source | Source: Ambulatory Visit | Attending: Radiation Oncology | Admitting: Radiation Oncology

## 2021-12-02 ENCOUNTER — Other Ambulatory Visit: Payer: Self-pay

## 2021-12-02 DIAGNOSIS — Z510 Encounter for antineoplastic radiation therapy: Secondary | ICD-10-CM | POA: Diagnosis not present

## 2021-12-02 LAB — RAD ONC ARIA SESSION SUMMARY
Course Elapsed Days: 7
Plan Fractions Treated to Date: 3
Plan Prescribed Dose Per Fraction: 2.66 Gy
Plan Total Fractions Prescribed: 16
Plan Total Prescribed Dose: 42.56 Gy
Reference Point Dosage Given to Date: 7.98 Gy
Reference Point Session Dosage Given: 2.66 Gy
Session Number: 3

## 2021-12-02 NOTE — Progress Notes (Signed)
Called to speak with patient about the Gordy Levan and [employer], left her a voicemail asking that she return my call.

## 2021-12-03 ENCOUNTER — Other Ambulatory Visit: Payer: Self-pay

## 2021-12-03 ENCOUNTER — Other Ambulatory Visit: Payer: Self-pay | Admitting: *Deleted

## 2021-12-03 ENCOUNTER — Ambulatory Visit
Admission: RE | Admit: 2021-12-03 | Discharge: 2021-12-03 | Disposition: A | Discharge status: Home / Self Care | Payer: No Typology Code available for payment source | Source: Ambulatory Visit | Attending: Radiation Oncology | Admitting: Radiation Oncology

## 2021-12-03 DIAGNOSIS — C50012 Malignant neoplasm of nipple and areola, left female breast: Secondary | ICD-10-CM

## 2021-12-03 DIAGNOSIS — Z510 Encounter for antineoplastic radiation therapy: Secondary | ICD-10-CM | POA: Diagnosis not present

## 2021-12-03 LAB — RAD ONC ARIA SESSION SUMMARY
Course Elapsed Days: 8
Plan Fractions Treated to Date: 4
Plan Prescribed Dose Per Fraction: 2.66 Gy
Plan Total Fractions Prescribed: 16
Plan Total Prescribed Dose: 42.56 Gy
Reference Point Dosage Given to Date: 10.64 Gy
Reference Point Session Dosage Given: 2.66 Gy
Session Number: 4

## 2021-12-04 ENCOUNTER — Other Ambulatory Visit: Payer: Self-pay

## 2021-12-04 ENCOUNTER — Ambulatory Visit
Admission: RE | Admit: 2021-12-04 | Discharge: 2021-12-04 | Disposition: A | Discharge status: Home / Self Care | Payer: No Typology Code available for payment source | Source: Ambulatory Visit | Attending: Radiation Oncology | Admitting: Radiation Oncology

## 2021-12-04 ENCOUNTER — Inpatient Hospital Stay: Payer: No Typology Code available for payment source

## 2021-12-04 DIAGNOSIS — Z170 Estrogen receptor positive status [ER+]: Secondary | ICD-10-CM

## 2021-12-04 DIAGNOSIS — Z510 Encounter for antineoplastic radiation therapy: Secondary | ICD-10-CM | POA: Diagnosis not present

## 2021-12-04 LAB — CBC
HCT: 44.7 % (ref 36.0–46.0)
Hemoglobin: 15 g/dL (ref 12.0–15.0)
MCH: 31.1 pg (ref 26.0–34.0)
MCHC: 33.6 g/dL (ref 30.0–36.0)
MCV: 92.7 fL (ref 80.0–100.0)
Platelets: 177 10*3/uL (ref 150–400)
RBC: 4.82 MIL/uL (ref 3.87–5.11)
RDW: 11.8 % (ref 11.5–15.5)
WBC: 5.2 10*3/uL (ref 4.0–10.5)
nRBC: 0 % (ref 0.0–0.2)

## 2021-12-04 LAB — RAD ONC ARIA SESSION SUMMARY
Course Elapsed Days: 9
Plan Fractions Treated to Date: 5
Plan Prescribed Dose Per Fraction: 2.66 Gy
Plan Total Fractions Prescribed: 16
Plan Total Prescribed Dose: 42.56 Gy
Reference Point Dosage Given to Date: 13.3 Gy
Reference Point Session Dosage Given: 2.66 Gy
Session Number: 5

## 2021-12-05 ENCOUNTER — Other Ambulatory Visit: Payer: Self-pay

## 2021-12-05 ENCOUNTER — Ambulatory Visit
Admission: RE | Admit: 2021-12-05 | Discharge: 2021-12-05 | Disposition: A | Discharge status: Home / Self Care | Payer: No Typology Code available for payment source | Source: Ambulatory Visit | Attending: Radiation Oncology | Admitting: Radiation Oncology

## 2021-12-05 DIAGNOSIS — C50412 Malignant neoplasm of upper-outer quadrant of left female breast: Secondary | ICD-10-CM | POA: Insufficient documentation

## 2021-12-05 DIAGNOSIS — Z510 Encounter for antineoplastic radiation therapy: Secondary | ICD-10-CM | POA: Insufficient documentation

## 2021-12-05 DIAGNOSIS — Z170 Estrogen receptor positive status [ER+]: Secondary | ICD-10-CM | POA: Insufficient documentation

## 2021-12-05 LAB — RAD ONC ARIA SESSION SUMMARY
Course Elapsed Days: 10
Plan Fractions Treated to Date: 6
Plan Prescribed Dose Per Fraction: 2.66 Gy
Plan Total Fractions Prescribed: 16
Plan Total Prescribed Dose: 42.56 Gy
Reference Point Dosage Given to Date: 15.96 Gy
Reference Point Session Dosage Given: 2.66 Gy
Session Number: 6

## 2021-12-08 ENCOUNTER — Other Ambulatory Visit: Payer: Self-pay

## 2021-12-08 ENCOUNTER — Ambulatory Visit
Admission: RE | Admit: 2021-12-08 | Discharge: 2021-12-08 | Disposition: A | Discharge status: Home / Self Care | Payer: No Typology Code available for payment source | Source: Ambulatory Visit | Attending: Radiation Oncology | Admitting: Radiation Oncology

## 2021-12-08 DIAGNOSIS — Z510 Encounter for antineoplastic radiation therapy: Secondary | ICD-10-CM | POA: Diagnosis not present

## 2021-12-08 LAB — RAD ONC ARIA SESSION SUMMARY
Course Elapsed Days: 13
Plan Fractions Treated to Date: 7
Plan Prescribed Dose Per Fraction: 2.66 Gy
Plan Total Fractions Prescribed: 16
Plan Total Prescribed Dose: 42.56 Gy
Reference Point Dosage Given to Date: 18.62 Gy
Reference Point Session Dosage Given: 2.66 Gy
Session Number: 7

## 2021-12-09 ENCOUNTER — Ambulatory Visit
Admission: RE | Admit: 2021-12-09 | Discharge: 2021-12-09 | Disposition: A | Discharge status: Home / Self Care | Payer: No Typology Code available for payment source | Source: Ambulatory Visit | Attending: Radiation Oncology | Admitting: Radiation Oncology

## 2021-12-09 ENCOUNTER — Other Ambulatory Visit: Payer: Self-pay

## 2021-12-09 DIAGNOSIS — Z510 Encounter for antineoplastic radiation therapy: Secondary | ICD-10-CM | POA: Diagnosis not present

## 2021-12-09 LAB — RAD ONC ARIA SESSION SUMMARY
Course Elapsed Days: 14
Plan Fractions Treated to Date: 8
Plan Prescribed Dose Per Fraction: 2.66 Gy
Plan Total Fractions Prescribed: 16
Plan Total Prescribed Dose: 42.56 Gy
Reference Point Dosage Given to Date: 21.28 Gy
Reference Point Session Dosage Given: 2.66 Gy
Session Number: 8

## 2021-12-10 ENCOUNTER — Other Ambulatory Visit: Payer: Self-pay

## 2021-12-10 ENCOUNTER — Ambulatory Visit
Admission: RE | Admit: 2021-12-10 | Discharge: 2021-12-10 | Disposition: A | Discharge status: Home / Self Care | Payer: No Typology Code available for payment source | Source: Ambulatory Visit | Attending: Radiation Oncology | Admitting: Radiation Oncology

## 2021-12-10 DIAGNOSIS — Z510 Encounter for antineoplastic radiation therapy: Secondary | ICD-10-CM | POA: Diagnosis not present

## 2021-12-10 LAB — RAD ONC ARIA SESSION SUMMARY
Course Elapsed Days: 15
Plan Fractions Treated to Date: 9
Plan Prescribed Dose Per Fraction: 2.66 Gy
Plan Total Fractions Prescribed: 16
Plan Total Prescribed Dose: 42.56 Gy
Reference Point Dosage Given to Date: 23.94 Gy
Reference Point Session Dosage Given: 2.66 Gy
Session Number: 9

## 2021-12-11 ENCOUNTER — Ambulatory Visit
Admission: RE | Admit: 2021-12-11 | Discharge: 2021-12-11 | Disposition: A | Discharge status: Home / Self Care | Payer: No Typology Code available for payment source | Source: Ambulatory Visit | Attending: Radiation Oncology | Admitting: Radiation Oncology

## 2021-12-11 ENCOUNTER — Other Ambulatory Visit: Payer: Self-pay

## 2021-12-11 DIAGNOSIS — Z510 Encounter for antineoplastic radiation therapy: Secondary | ICD-10-CM | POA: Diagnosis not present

## 2021-12-11 LAB — RAD ONC ARIA SESSION SUMMARY
Course Elapsed Days: 16
Plan Fractions Treated to Date: 10
Plan Prescribed Dose Per Fraction: 2.66 Gy
Plan Total Fractions Prescribed: 16
Plan Total Prescribed Dose: 42.56 Gy
Reference Point Dosage Given to Date: 26.6 Gy
Reference Point Session Dosage Given: 2.66 Gy
Session Number: 10

## 2021-12-12 ENCOUNTER — Ambulatory Visit
Admission: RE | Admit: 2021-12-12 | Discharge: 2021-12-12 | Disposition: A | Discharge status: Home / Self Care | Payer: No Typology Code available for payment source | Source: Ambulatory Visit | Attending: Radiation Oncology | Admitting: Radiation Oncology

## 2021-12-12 ENCOUNTER — Other Ambulatory Visit: Payer: Self-pay

## 2021-12-12 ENCOUNTER — Emergency Department: Payer: No Typology Code available for payment source

## 2021-12-12 DIAGNOSIS — Z20822 Contact with and (suspected) exposure to covid-19: Secondary | ICD-10-CM | POA: Diagnosis not present

## 2021-12-12 DIAGNOSIS — J40 Bronchitis, not specified as acute or chronic: Secondary | ICD-10-CM | POA: Diagnosis not present

## 2021-12-12 DIAGNOSIS — J069 Acute upper respiratory infection, unspecified: Secondary | ICD-10-CM | POA: Diagnosis not present

## 2021-12-12 DIAGNOSIS — R059 Cough, unspecified: Secondary | ICD-10-CM | POA: Diagnosis present

## 2021-12-12 DIAGNOSIS — Z510 Encounter for antineoplastic radiation therapy: Secondary | ICD-10-CM | POA: Diagnosis not present

## 2021-12-12 LAB — RAD ONC ARIA SESSION SUMMARY
Course Elapsed Days: 17
Plan Fractions Treated to Date: 11
Plan Prescribed Dose Per Fraction: 2.66 Gy
Plan Total Fractions Prescribed: 16
Plan Total Prescribed Dose: 42.56 Gy
Reference Point Dosage Given to Date: 29.26 Gy
Reference Point Session Dosage Given: 2.66 Gy
Session Number: 11

## 2021-12-12 NOTE — ED Triage Notes (Signed)
Pt presents via POV with complaints of a dry cough that started today with chest congestion. She notes taking OTC cough medication PTA without improvement. Pt is a breast cancer (left) patient actively getting radiation - last treated today and that's when the sx began Denies CP or SOB.

## 2021-12-13 ENCOUNTER — Emergency Department
Admission: EM | Admit: 2021-12-13 | Discharge: 2021-12-13 | Disposition: A | Discharge status: Home / Self Care | Payer: No Typology Code available for payment source | Attending: Emergency Medicine | Admitting: Emergency Medicine

## 2021-12-13 DIAGNOSIS — J069 Acute upper respiratory infection, unspecified: Secondary | ICD-10-CM

## 2021-12-13 DIAGNOSIS — R051 Acute cough: Secondary | ICD-10-CM

## 2021-12-13 DIAGNOSIS — J40 Bronchitis, not specified as acute or chronic: Secondary | ICD-10-CM

## 2021-12-13 LAB — RESP PANEL BY RT-PCR (FLU A&B, COVID) ARPGX2
Influenza A by PCR: NEGATIVE
Influenza B by PCR: NEGATIVE
SARS Coronavirus 2 by RT PCR: NEGATIVE

## 2021-12-13 NOTE — ED Provider Notes (Signed)
[facility] Provider Note    Event Date/Time   First MD Initiated Contact with Patient 12/13/21 [phone]     (approximate)   History   Cough   HPI  Adriana Berg is a 66 y.o. female who presents to the ED for evaluation of Cough   Patient presents to the ED for evaluation of a few hours of an acute nonproductive cough that is just been present today.  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  No fever, productive cough, sore throat, chest pain.  Does report some respiratory congestion and postnasal drip.  Physical Exam   Triage Vital Signs: ED Triage Vitals  Enc Vitals Group     BP 12/12/21 2322 131/65     Pulse Rate 12/12/21 2322 89     Resp 12/12/21 2322 20     Temp 12/12/21 2322 98.4 F (36.9 C)     Temp Source 12/12/21 2322 Oral     SpO2 12/12/21 2322 100 %     Weight 12/12/21 2321 151 lb 3.8 oz (68.6 kg)     Height 12/12/21 2321 '5\' 2"'$  (1.575 m)     Head Circumference --      Peak Flow --      Pain Score 12/12/21 2321 6     Pain Loc --      Pain Edu? --      Excl. in [place]? --     Most recent vital signs: Vitals:   12/12/21 2322 12/13/21 0348  BP: 131/65 131/65  Pulse: 89 89  Resp: 20 16  Temp: 98.4 F (36.9 C) 98.4 F (36.9 C)  SpO2: 100% 100%    General: Awake, no distress.  Looks well, conversational.  No coughing throughout my evaluation. CV:  Good peripheral perfusion. RRR Resp:  Normal effort.  No wheezing Abd:  No distention.  MSK:  No deformity noted.  Neuro:  No focal deficits appreciated. Other:     ED Results / Procedures / Treatments   Labs (all labs ordered are listed, but only abnormal results are displayed) Labs Reviewed  RESP PANEL BY RT-PCR (FLU A&B, COVID) [id number]    EKG   RADIOLOGY CXR interpreted by me without evidence of acute cardiopulmonary pathology.  Official radiology report(s): DG Chest 2 View  Result Date: 12/12/2021 CLINICAL DATA:  Cough and  chest congestion. EXAM: CHEST - 2 VIEW COMPARISON:  Radiograph 01/30/2015, CT 04/29/2021 FINDINGS: Chronic eventration of left hemidiaphragm with adjacent linear atelectasis or scarring at the left lung base. No acute airspace disease. Normal heart size with stable mediastinal contours. No pulmonary edema. No pleural effusion or pneumothorax. Coarse calcifications in the right breast. No acute osseous abnormalities. IMPRESSION: 1. No acute abnormality. 2. Chronic eventration of left hemidiaphragm with adjacent linear atelectasis or scarring. Electronically Signed   By: Keith Rake M.D.   On: 12/12/2021 23:50    PROCEDURES and INTERVENTIONS:  Procedures  Medications - No data to display   IMPRESSION / MDM / [place] / ED COURSE  I reviewed the triage vital signs and the nursing notes.  Differential diagnosis includes, but is not limited to, bronchitis, pneumothorax, asthma exacerbation, COPD, pneumonia, ACS  66 year old female presents to the ED with a few hours of acute nonproductive cough, is consistent with acute viral bronchitis and suitable for outpatient management.  CXR is clear and she looks well.  Testing negative for flu and COVID.  FINAL CLINICAL IMPRESSION(S) / ED DIAGNOSES   Final diagnoses:  Acute cough  Bronchitis  Viral URI with cough     Rx / DC Orders   ED Discharge Orders     None        Note:  This document was prepared using Dragon voice recognition software and may include unintentional dictation errors.   Vladimir Crofts, MD 12/13/21 [phone]

## 2021-12-15 ENCOUNTER — Ambulatory Visit
Admission: RE | Admit: 2021-12-15 | Discharge: 2021-12-15 | Disposition: A | Discharge status: Home / Self Care | Payer: No Typology Code available for payment source | Source: Ambulatory Visit | Attending: Radiation Oncology | Admitting: Radiation Oncology

## 2021-12-15 ENCOUNTER — Other Ambulatory Visit: Payer: Self-pay

## 2021-12-15 DIAGNOSIS — Z510 Encounter for antineoplastic radiation therapy: Secondary | ICD-10-CM | POA: Diagnosis not present

## 2021-12-15 LAB — RAD ONC ARIA SESSION SUMMARY
Course Elapsed Days: 20
Plan Fractions Treated to Date: 12
Plan Prescribed Dose Per Fraction: 2.66 Gy
Plan Total Fractions Prescribed: 16
Plan Total Prescribed Dose: 42.56 Gy
Reference Point Dosage Given to Date: 31.92 Gy
Reference Point Session Dosage Given: 2.66 Gy
Session Number: 12

## 2021-12-16 ENCOUNTER — Encounter: Payer: Self-pay | Admitting: Radiation Oncology

## 2021-12-16 ENCOUNTER — Other Ambulatory Visit: Payer: Self-pay

## 2021-12-16 ENCOUNTER — Ambulatory Visit
Admission: RE | Admit: 2021-12-16 | Discharge: 2021-12-16 | Disposition: A | Discharge status: Home / Self Care | Payer: No Typology Code available for payment source | Source: Ambulatory Visit | Attending: Radiation Oncology | Admitting: Radiation Oncology

## 2021-12-16 ENCOUNTER — Other Ambulatory Visit: Payer: Self-pay | Admitting: *Deleted

## 2021-12-16 DIAGNOSIS — Z510 Encounter for antineoplastic radiation therapy: Secondary | ICD-10-CM | POA: Diagnosis not present

## 2021-12-16 LAB — RAD ONC ARIA SESSION SUMMARY
Course Elapsed Days: 21
Plan Fractions Treated to Date: 13
Plan Prescribed Dose Per Fraction: 2.66 Gy
Plan Total Fractions Prescribed: 16
Plan Total Prescribed Dose: 42.56 Gy
Reference Point Dosage Given to Date: 34.58 Gy
Reference Point Session Dosage Given: 2.66 Gy
Session Number: 13

## 2021-12-16 MED ORDER — SILVER SULFADIAZINE 1 % EX CREA: 1.0000 | TOPICAL_CREAM | Freq: Every day | CUTANEOUS | 0 refills | Status: AC

## 2021-12-17 ENCOUNTER — Ambulatory Visit: Admission: RE | Admit: 2021-12-17 | Payer: No Typology Code available for payment source | Source: Ambulatory Visit

## 2021-12-17 DIAGNOSIS — Z510 Encounter for antineoplastic radiation therapy: Secondary | ICD-10-CM | POA: Diagnosis not present

## 2021-12-18 ENCOUNTER — Ambulatory Visit
Admission: RE | Admit: 2021-12-18 | Discharge: 2021-12-18 | Disposition: A | Discharge status: Home / Self Care | Payer: No Typology Code available for payment source | Source: Ambulatory Visit | Attending: Radiation Oncology | Admitting: Radiation Oncology

## 2021-12-18 ENCOUNTER — Other Ambulatory Visit: Payer: Self-pay

## 2021-12-18 ENCOUNTER — Inpatient Hospital Stay: Payer: No Typology Code available for payment source | Attending: Oncology

## 2021-12-18 DIAGNOSIS — C50012 Malignant neoplasm of nipple and areola, left female breast: Secondary | ICD-10-CM

## 2021-12-18 DIAGNOSIS — Z510 Encounter for antineoplastic radiation therapy: Secondary | ICD-10-CM | POA: Diagnosis not present

## 2021-12-18 DIAGNOSIS — Z170 Estrogen receptor positive status [ER+]: Secondary | ICD-10-CM | POA: Insufficient documentation

## 2021-12-18 DIAGNOSIS — C50412 Malignant neoplasm of upper-outer quadrant of left female breast: Secondary | ICD-10-CM | POA: Insufficient documentation

## 2021-12-18 LAB — RAD ONC ARIA SESSION SUMMARY
Course Elapsed Days: 23
Plan Fractions Treated to Date: 1
Plan Prescribed Dose Per Fraction: 2 Gy
Plan Total Fractions Prescribed: 5
Plan Total Prescribed Dose: 10 Gy
Reference Point Dosage Given to Date: 2 Gy
Reference Point Session Dosage Given: 2 Gy
Session Number: 14

## 2021-12-18 LAB — CBC
HCT: 42.8 % (ref 36.0–46.0)
Hemoglobin: 14.7 g/dL (ref 12.0–15.0)
MCH: 31.3 pg (ref 26.0–34.0)
MCHC: 34.3 g/dL (ref 30.0–36.0)
MCV: 91.1 fL (ref 80.0–100.0)
Platelets: 176 10*3/uL (ref 150–400)
RBC: 4.7 MIL/uL (ref 3.87–5.11)
RDW: 11.5 % (ref 11.5–15.5)
WBC: 4 10*3/uL (ref 4.0–10.5)
nRBC: 0 % (ref 0.0–0.2)

## 2021-12-19 ENCOUNTER — Ambulatory Visit: Payer: No Typology Code available for payment source

## 2021-12-19 ENCOUNTER — Ambulatory Visit
Admission: RE | Admit: 2021-12-19 | Discharge: 2021-12-19 | Disposition: A | Discharge status: Home / Self Care | Payer: No Typology Code available for payment source | Source: Ambulatory Visit | Attending: Radiation Oncology | Admitting: Radiation Oncology

## 2021-12-19 ENCOUNTER — Other Ambulatory Visit: Payer: Self-pay

## 2021-12-19 DIAGNOSIS — Z510 Encounter for antineoplastic radiation therapy: Secondary | ICD-10-CM | POA: Diagnosis not present

## 2021-12-19 LAB — RAD ONC ARIA SESSION SUMMARY
Course Elapsed Days: 24
Plan Fractions Treated to Date: 2
Plan Prescribed Dose Per Fraction: 2 Gy
Plan Total Fractions Prescribed: 5
Plan Total Prescribed Dose: 10 Gy
Reference Point Dosage Given to Date: 4 Gy
Reference Point Session Dosage Given: 2 Gy
Session Number: 15

## 2021-12-22 ENCOUNTER — Ambulatory Visit
Admission: RE | Admit: 2021-12-22 | Discharge: 2021-12-22 | Disposition: A | Discharge status: Home / Self Care | Payer: No Typology Code available for payment source | Source: Ambulatory Visit | Attending: Radiation Oncology | Admitting: Radiation Oncology

## 2021-12-22 ENCOUNTER — Other Ambulatory Visit: Payer: Self-pay

## 2021-12-22 DIAGNOSIS — Z510 Encounter for antineoplastic radiation therapy: Secondary | ICD-10-CM | POA: Diagnosis not present

## 2021-12-22 LAB — RAD ONC ARIA SESSION SUMMARY
Course Elapsed Days: 27
Plan Fractions Treated to Date: 3
Plan Prescribed Dose Per Fraction: 2 Gy
Plan Total Fractions Prescribed: 5
Plan Total Prescribed Dose: 10 Gy
Reference Point Dosage Given to Date: 6 Gy
Reference Point Session Dosage Given: 2 Gy
Session Number: 16

## 2021-12-23 ENCOUNTER — Other Ambulatory Visit: Payer: Self-pay

## 2021-12-23 ENCOUNTER — Ambulatory Visit
Admission: RE | Admit: 2021-12-23 | Discharge: 2021-12-23 | Disposition: A | Discharge status: Home / Self Care | Payer: No Typology Code available for payment source | Source: Ambulatory Visit | Attending: Radiation Oncology | Admitting: Radiation Oncology

## 2021-12-23 DIAGNOSIS — Z510 Encounter for antineoplastic radiation therapy: Secondary | ICD-10-CM | POA: Diagnosis not present

## 2021-12-23 LAB — RAD ONC ARIA SESSION SUMMARY
Course Elapsed Days: 28
Plan Fractions Treated to Date: 4
Plan Prescribed Dose Per Fraction: 2 Gy
Plan Total Fractions Prescribed: 5
Plan Total Prescribed Dose: 10 Gy
Reference Point Dosage Given to Date: 8 Gy
Reference Point Session Dosage Given: 2 Gy
Session Number: 17

## 2021-12-24 ENCOUNTER — Other Ambulatory Visit: Payer: Self-pay

## 2021-12-24 ENCOUNTER — Ambulatory Visit
Admission: RE | Admit: 2021-12-24 | Discharge: 2021-12-24 | Disposition: A | Discharge status: Home / Self Care | Payer: No Typology Code available for payment source | Source: Ambulatory Visit | Attending: Radiation Oncology | Admitting: Radiation Oncology

## 2021-12-24 DIAGNOSIS — Z510 Encounter for antineoplastic radiation therapy: Secondary | ICD-10-CM | POA: Diagnosis not present

## 2021-12-24 LAB — RAD ONC ARIA SESSION SUMMARY
Course Elapsed Days: 29
Plan Fractions Treated to Date: 5
Plan Prescribed Dose Per Fraction: 2 Gy
Plan Total Fractions Prescribed: 5
Plan Total Prescribed Dose: 10 Gy
Reference Point Dosage Given to Date: 10 Gy
Reference Point Session Dosage Given: 2 Gy
Session Number: 18

## 2021-12-25 ENCOUNTER — Other Ambulatory Visit: Payer: Self-pay

## 2021-12-25 ENCOUNTER — Ambulatory Visit
Admission: RE | Admit: 2021-12-25 | Discharge: 2021-12-25 | Disposition: A | Discharge status: Home / Self Care | Payer: No Typology Code available for payment source | Source: Ambulatory Visit | Attending: Radiation Oncology | Admitting: Radiation Oncology

## 2021-12-25 ENCOUNTER — Ambulatory Visit: Payer: No Typology Code available for payment source

## 2021-12-25 DIAGNOSIS — Z510 Encounter for antineoplastic radiation therapy: Secondary | ICD-10-CM | POA: Diagnosis not present

## 2021-12-25 LAB — RAD ONC ARIA SESSION SUMMARY
Course Elapsed Days: 30
Plan Fractions Treated to Date: 1
Plan Prescribed Dose Per Fraction: 2.66 Gy
Plan Total Fractions Prescribed: 3
Plan Total Prescribed Dose: 7.98 Gy
Reference Point Dosage Given to Date: 37.24 Gy
Reference Point Session Dosage Given: 2.66 Gy
Session Number: 19

## 2021-12-26 ENCOUNTER — Other Ambulatory Visit: Payer: Self-pay

## 2021-12-26 ENCOUNTER — Ambulatory Visit: Payer: No Typology Code available for payment source

## 2021-12-26 ENCOUNTER — Ambulatory Visit
Admission: RE | Admit: 2021-12-26 | Discharge: 2021-12-26 | Disposition: A | Discharge status: Home / Self Care | Payer: No Typology Code available for payment source | Source: Ambulatory Visit | Attending: Radiation Oncology | Admitting: Radiation Oncology

## 2021-12-26 DIAGNOSIS — Z510 Encounter for antineoplastic radiation therapy: Secondary | ICD-10-CM | POA: Diagnosis not present

## 2021-12-26 LAB — RAD ONC ARIA SESSION SUMMARY
Course Elapsed Days: 31
Plan Fractions Treated to Date: 2
Plan Prescribed Dose Per Fraction: 2.66 Gy
Plan Total Fractions Prescribed: 3
Plan Total Prescribed Dose: 7.98 Gy
Reference Point Dosage Given to Date: 39.9 Gy
Reference Point Session Dosage Given: 2.66 Gy
Session Number: 20

## 2021-12-30 ENCOUNTER — Ambulatory Visit
Admission: RE | Admit: 2021-12-30 | Discharge: 2021-12-30 | Disposition: A | Discharge status: Home / Self Care | Payer: No Typology Code available for payment source | Source: Ambulatory Visit | Attending: Radiation Oncology | Admitting: Radiation Oncology

## 2021-12-30 ENCOUNTER — Other Ambulatory Visit: Payer: Self-pay

## 2021-12-30 DIAGNOSIS — Z510 Encounter for antineoplastic radiation therapy: Secondary | ICD-10-CM | POA: Diagnosis not present

## 2021-12-30 LAB — RAD ONC ARIA SESSION SUMMARY
Course Elapsed Days: 35
Plan Fractions Treated to Date: 3
Plan Prescribed Dose Per Fraction: 2.66 Gy
Plan Total Fractions Prescribed: 3
Plan Total Prescribed Dose: 7.98 Gy
Reference Point Dosage Given to Date: 42.56 Gy
Reference Point Session Dosage Given: 2.66 Gy
Session Number: 21

## 2022-01-01 ENCOUNTER — Encounter: Payer: Self-pay | Admitting: *Deleted

## 2022-01-01 ENCOUNTER — Encounter: Payer: Self-pay | Admitting: Oncology

## 2022-01-01 ENCOUNTER — Inpatient Hospital Stay (HOSPITAL_BASED_OUTPATIENT_CLINIC_OR_DEPARTMENT_OTHER): Payer: No Typology Code available for payment source | Admitting: Oncology

## 2022-01-01 VITALS — BP 131/76 | HR 78 | Temp 97.9°F | Wt 151.9 lb

## 2022-01-01 DIAGNOSIS — C50811 Malignant neoplasm of overlapping sites of right female breast: Secondary | ICD-10-CM

## 2022-01-01 DIAGNOSIS — Z803 Family history of malignant neoplasm of breast: Secondary | ICD-10-CM | POA: Diagnosis not present

## 2022-01-01 DIAGNOSIS — Z853 Personal history of malignant neoplasm of breast: Secondary | ICD-10-CM | POA: Diagnosis not present

## 2022-01-01 DIAGNOSIS — Z79811 Long term (current) use of aromatase inhibitors: Secondary | ICD-10-CM | POA: Insufficient documentation

## 2022-01-01 DIAGNOSIS — M8580 Other specified disorders of bone density and structure, unspecified site: Secondary | ICD-10-CM | POA: Insufficient documentation

## 2022-01-01 MED ORDER — ANASTROZOLE 1 MG PO TABS: 1.0000 mg | ORAL_TABLET | Freq: Every day | ORAL | 2 refills | Status: DC

## 2022-01-01 NOTE — Progress Notes (Signed)
Hematology/Oncology Progress note [phone] [phone]        ASSESSMENT & PLAN:   Cancer Staging  Breast cancer [facility]) Staging form: Breast, AJCC 8th Edition - Clinical stage from 09/17/2021: Stage IA (cT1b, cN0, cM0, G1, ER+, PR+, HER2-) - Signed by Earlie Server, MD on 09/26/2021 - Pathologic stage from 10/10/2021: Stage IA (pT1b, pN0, cM0, G2, ER+, PR+, HER2-, Oncotype DX score: 17) - Signed by Earlie Server, MD on 11/06/2021   Breast cancer [facility]) pathology results were reviewed and discussed with patient. 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 Oncotype Dx recurrence score 17 No chemotherapy benefit.  S/p adjuvant RT Rationale of using aromatase inhibitor -Arimidex  discussed with patient.  Side effects of Arimidex including but not limited to hot flush, joint pain, fatigue, mood swing, osteoporosis discussed with patient. Patient voices understanding and willing to proceed.  Prescription sent to pharmacy   Family history of breast cancer I have referred her to genetic counselor.  Patient did not establish care. We discussed about this option today again and if she declined.  Aromatase inhibitor use Recommend patient to take calcium and vitamin D supplementation. 03/21/2021 DEXA at [facility] showed osteopenia.  Plan to repeat DEXA in 2 years.  History of breast cancer Remote right breast history of cancer status post surgery.-2007 Patient took endocrine therapy for 5 years.  Details unknown.  Orders Placed This Encounter  Procedures   CBC with Differential/Platelet    Standing Status:   Future    Standing Expiration Date:   01/01/2023   Comprehensive metabolic panel    Standing Status:   Future    Standing Expiration Date:   01/01/2023    Follow up  3 months.  All questions were answered. The patient knows to call the clinic with any problems, questions or concerns.  Earlie Server, MD, PhD [facility] Hematology Oncology 01/01/2022    CHIEF COMPLAINTS/PURPOSE OF CONSULTATION:  Breast cancer  HISTORY OF PRESENTING ILLNESS:  Adriana Berg 66 y.o. female presents for follow up of breast cancer  I have reviewed her chart and materials related to her cancer extensively and collaborated history with the patient. Summary of oncologic history is as follows: Oncology History  Breast cancer ([place])  08/04/2021 Imaging   Bilateral screening mammogram In the right breast, a possible asymmetry warrants further evaluation. Interval resection of RIGHT breast dystrophic calcifications.   In the left breast, possible architectural distortion warrants further evaluation.     08/28/2021 Mammogram   Bilateral diagnostic mammogram showed 1. There is a suspicious 6 mm mass in the LEFT breast at 2 o'clock. Recommend ultrasound-guided biopsy for definitive characterization. Recommend attention on post marker placement mammogram to assess for mammographic/sonographic correlation. 2. No suspicious LEFT axillary adenopathy. 3. No mammographic evidence of malignancy in the RIGHT breast.     09/17/2021 Cancer Staging   Staging form: Breast, AJCC 8th Edition - Clinical stage from 09/17/2021: Stage IA (cT1b, cN0, cM0, G1, ER+, PR+, HER2-) - Signed by Earlie Server, MD on 09/26/2021 Stage prefix: Initial diagnosis Histologic grading system: 3 grade system   09/26/2021 Initial Diagnosis   Breast cancer ([place])  09/17/2021, left breast mass core biopsy showed invasive mammary carcinoma, no special type.  Grade 1.  DCIS present, LVI not identified, ER+ 90%, PR+ 90%, HER2 negative[score 1+]    10/10/2021 Surgery   Patient underwent left breast lumpectomy and sentinel lymph node biopsy  Pathology showed pT1b pN0 A. breast, left; lumpectomy:  -  8 mm invasive carcinoma of no special type, margins negative (see  below)  B. Sentinel node #1; excision: - No evidence of malignancy in 1 sentinel node.  C.  Sentinel node  #2; excision: - No evidence of  malignancy in 2 sentinel nodes.  D.  Sentinel node  #3; excision: - No evidence of malignancy in 2 sentinel nodes.   TUMOR Histologic Type: Invasive carcinoma of no special type Histologic Grade (Nottingham Histologic Score)      Glandular (Acinar)/Tubular Differentiation: 2 (of 3)      Nuclear Pleomorphism: 3 (of 3)      Mitotic Rate: 1 (of 3)      Overall Grade: 2 (of 3) Tumor Size: 8 mm Tumor Focality: Unifocal Ductal Carcinoma In Situ (DCIS): Present      Negative for extensive intraductal component Tumor Extent: Not applicable Lymphatic and/or Vascular Invasion: Not identified Treatment Effect in the Breast: No known presurgical therapy  MARGINS Margin Status for Invasive Carcinoma: All margins negative for invasive carcinoma.      Distance from closest margin: 2.5 mm      Specify closest margin: Anterior  Margin Status for DCIS: All margins negative for DCIS.      Distance from DCIS to closest margin: 2.8 mm      Specify closest margin: Anterior  REGIONAL LYMPH NODES Regional Lymph Node Status: All regional lymph nodes negative for tumor      Total Number of Lymph Nodes Examined (sentinel and non-sentinel): 5       Number of Sentinel Nodes Examined: 5  DISTANT METASTASIS Distant Site(s) Involved, if applicable: Not applicable    93/02/3555 Oncotype testing   Oncotype DX recurrence score 17, distant recurrence risk at 9 years with AI or tamoxifen alone- 5%, absolute chemotherapy benefit < 1%   10/10/2021 Cancer Staging   Staging form: Breast, AJCC 8th Edition - Pathologic stage from 10/10/2021: Stage IA (pT1b, pN0, cM0, G2, ER+, PR+, HER2-, Oncotype DX score: 17) - Signed by Earlie Server, MD on 11/06/2021 Stage prefix: Initial diagnosis Multigene prognostic tests performed: Oncotype DX Recurrence score range: Greater than or equal to 11 Histologic grading system: 3 grade system   11/25/2021 - 12/30/2021 Radiation Therapy   Adjuvant Radiation to breast    Remote stage I  right breast cancer in 2007, status post resection.  Patient recalls taking adjuvant endocrine therapy for 5 years.  Details unknown.  Previous pathology report is not available in EMR.  Status post radiation of her breast.  She reports feeling well and tolerates radiation.  No new complaints today.    MEDICAL HISTORY:  Past Medical History:  Diagnosis Date   Borderline diabetes    Breast cancer ([place]) 2007   RT LUMPECTOMY, ER +   Lab test positive for detection of COVID-19 virus 12/2018   MRSA (methicillin resistant staph aureus) culture positive 06/2015   lower abdomen/mons pubis abscess   Personal history of malignant neoplasm of breast    invasive ductal carcinoma with tubular features,Stage 1,ER pos,HER2 neg   Personal history of radiation therapy 2007   Personal history of tobacco use, presenting hazards to health    Pneumonia 02/2015   Radiation 2007   BREAST CA   Unspecified essential hypertension     SURGICAL HISTORY: Past Surgical History:  Procedure Laterality Date   ABDOMINAL HYSTERECTOMY  1995   AXILLARY SENTINEL NODE BIOPSY Right 2007   BREAST BIOPSY Right 2007   CORE - POS   BREAST BIOPSY Right  1995   EXCISIONAL - NEG   BREAST BIOPSY Left 09/17/2021   u/s bx, heart clip. 2:00, path pending.   BREAST LUMPECTOMY Right 2007   breast ca   BREAST LUMPECTOMY,RADIO FREQ LOCALIZER,AXILLARY SENTINEL LYMPH NODE BIOPSY Left 10/10/2021   Procedure: BREAST LUMPECTOMY,RADIO FREQ LOCALIZER,AXILLARY SENTINEL LYMPH NODE BIOPSY;  Surgeon: Olean Ree, MD;  Location: [facility] ORS;  Service: General;  Laterality: Left;   BREAST SURGERY  2007   right lumpectomy   COLONOSCOPY  12/27/2012   COLONOSCOPY WITH PROPOFOL N/A 02/14/2019   Procedure: COLONOSCOPY WITH PROPOFOL;  Surgeon: Jonathon Bellows, MD;  Location: [facility] ENDOSCOPY;  Service: Gastroenterology;  Laterality: N/A;   EXCISION OF BREAST BIOPSY Right 01/28/2021   Procedure: EXCISION OF BREAST abscess;  Surgeon: Olean Ree, MD;   Location: [facility] ORS;  Service: General;  Laterality: Right;    SOCIAL HISTORY: Social History   Socioeconomic History   Marital status: Single    Spouse name: Not on file   Number of children: Not on file   Years of education: Not on file   Highest education level: Not on file  Occupational History   Not on file  Tobacco Use   Smoking status: Some Days    Packs/day: 0.25    Years: 41.00    Total pack years: 10.25    Types: Cigarettes    Passive exposure: Past   Smokeless tobacco: Never  Vaping Use   Vaping Use: Never used  Substance and Sexual Activity   Alcohol use: No   Drug use: No   Sexual activity: Not on file  Other Topics Concern   Not on file  Social History Narrative   Lives at home with son, Independent at baseline   Social Determinants of Health   Financial Resource Strain: Medium Risk (10/31/2021)   Overall Financial Resource Strain (CARDIA)    Difficulty of Paying Living Expenses: Somewhat hard  Food Insecurity: No Food Insecurity (10/31/2021)   Hunger Vital Sign    Worried About Running Out of Food in the Last Year: Never true    Ran Out of Food in the Last Year: Never true  Transportation Needs: No Transportation Needs (10/31/2021)   PRAPARE - Hydrologist (Medical): No    Lack of Transportation (Non-Medical): No  Physical Activity: Insufficiently Active (10/31/2021)   Exercise Vital Sign    Days of Exercise per Week: 2 days    Minutes of Exercise per Session: 20 min  Stress: No Stress Concern Present (10/31/2021)   [place]    Feeling of Stress : Only a little  Social Connections: Moderately Isolated (10/31/2021)   Social Connection and Isolation Panel [NHANES]    Frequency of Communication with Friends and Family: More than three times a week    Frequency of Social Gatherings with Friends and Family: More than three times a week    Attends Religious  Services: More than 4 times per year    Active Member of [employer] or Organizations: No    Attends Archivist Meetings: Never    Marital Status: Never married  Intimate Partner Violence: Not At Risk (10/31/2021)   Humiliation, Afraid, Rape, and Kick questionnaire    Fear of Current or Ex-Partner: No    Emotionally Abused: No    Physically Abused: No    Sexually Abused: No    FAMILY HISTORY: Family History  Problem Relation Age of Onset   Diabetes Mellitus II  Mother    CAD Father    Hypertension Sister    Diabetes Sister    Cancer Sister    Breast cancer Sister 60   Stroke Brother     ALLERGIES:  is allergic to azithromycin.  MEDICATIONS:  Current Outpatient Medications  Medication Sig Dispense Refill   acetaminophen (TYLENOL) 500 MG tablet Take 2 tablets (1,000 mg total) by mouth every 6 (six) hours as needed for mild pain.     amLODipine (NORVASC) 2.5 MG tablet Take 2.5 mg by mouth every morning.     anastrozole (ARIMIDEX) 1 MG tablet Take 1 tablet (1 mg total) by mouth daily. 30 tablet 2   aspirin EC 81 MG tablet Take 81 mg by mouth daily. Swallow whole.     cholecalciferol (VITAMIN D) 25 MCG (1000 UNIT) tablet Take 1,000 Units by mouth daily.     gabapentin (NEURONTIN) 100 MG capsule Take 100 mg by mouth 3 (three) times daily.     ibuprofen (ADVIL) 600 MG tablet Take 1 tablet (600 mg total) by mouth every 8 (eight) hours as needed for moderate pain. 60 tablet 1   lisinopril (ZESTRIL) 40 MG tablet Take 40 mg by mouth every morning.     rosuvastatin (CRESTOR) 20 MG tablet Take 20 mg by mouth at bedtime.     silver sulfADIAZINE (SILVADENE) 1 % cream Apply 1 Application topically daily. 50 g 0   spironolactone (ALDACTONE) 50 MG tablet Take 50 mg by mouth daily.      fluconazole (DIFLUCAN) 150 MG tablet Take by mouth. (Patient not taking: Reported on 01/01/2022)     lidocaine-prilocaine (EMLA) cream Apply to the left areola and cover with plastic wrap one hour prior to  leaving for surgery. (Patient not taking: Reported on 11/06/2021) 5 g 0   oxyCODONE (OXY IR/ROXICODONE) 5 MG immediate release tablet Take 1 tablet (5 mg total) by mouth every 4 (four) hours as needed for severe pain. (Patient not taking: Reported on 01/01/2022) 30 tablet 0   No current facility-administered medications for this visit.    Review of Systems  Constitutional:  Negative for appetite change, chills, fatigue and fever.  HENT:   Negative for hearing loss and voice change.   Eyes:  Negative for eye problems.  Respiratory:  Negative for chest tightness and cough.   Cardiovascular:  Negative for chest pain.  Gastrointestinal:  Negative for abdominal distention, abdominal pain and blood in stool.  Endocrine: Negative for hot flashes.  Genitourinary:  Negative for difficulty urinating and frequency.   Musculoskeletal:  Negative for arthralgias.  Skin:  Negative for itching and rash.  Neurological:  Negative for extremity weakness.  Hematological:  Negative for adenopathy.  Psychiatric/Behavioral:  Negative for confusion.      PHYSICAL EXAMINATION: ECOG PERFORMANCE STATUS: 0 - Asymptomatic  Vitals:   01/01/22 1043  BP: 131/76  Pulse: 78  Temp: 97.9 F (36.6 C)  SpO2: 100%   Filed Weights   01/01/22 1043  Weight: 151 lb 14.4 oz (68.9 kg)    Physical Exam Constitutional:      General: She is not in acute distress.    Appearance: She is not diaphoretic.  HENT:     Head: Normocephalic and atraumatic.     Nose: Nose normal.     Mouth/Throat:     Pharynx: No oropharyngeal exudate.  Eyes:     General: No scleral icterus.    Pupils: Pupils are equal, round, and reactive to light.  Cardiovascular:  Rate and Rhythm: Normal rate and regular rhythm.     Heart sounds: No murmur heard. Pulmonary:     Effort: Pulmonary effort is normal. No respiratory distress.     Breath sounds: No rales.  Chest:     Chest wall: No tenderness.  Abdominal:     General: There is no  distension.     Palpations: Abdomen is soft.     Tenderness: There is no abdominal tenderness.  Musculoskeletal:        General: Normal range of motion.     Cervical back: Normal range of motion and neck supple.  Skin:    General: Skin is warm and dry.     Findings: No erythema.  Neurological:     Mental Status: She is alert and oriented to person, place, and time.     Cranial Nerves: No cranial nerve deficit.     Motor: No abnormal muscle tone.     Coordination: Coordination normal.  Psychiatric:        Mood and Affect: Mood and affect normal.       LABORATORY DATA:  I have reviewed the data as listed    Latest Ref Rng & Units 12/18/2021   11:03 AM 12/04/2021   11:04 AM 09/26/2021   10:03 AM  CBC  WBC 4.0 - 10.5 K/uL 4.0  5.2  6.0   Hemoglobin 12.0 - 15.0 g/dL 14.7  15.0  15.1   Hematocrit 36.0 - 46.0 % 42.8  44.7  44.7   Platelets 150 - 400 K/uL 176  177  191       Latest Ref Rng & Units 09/26/2021   10:03 AM 01/22/2021    8:35 AM 06/17/2017   11:49 AM  CMP  Glucose 70 - 99 mg/dL 100  86  101   BUN 8 - 23 mg/dL _0 Creatinine 0.44 - 1.00 mg/dL 0.86  0.96  0.82   Sodium 135 - 145 mmol/L 140  135  139   Potassium 3.5 - 5.1 mmol/L 4.3  4.3  4.0   Chloride 98 - 111 mmol/L 105  103  103   CO2 22 - 32 mmol/L _1 Calcium 8.9 - 10.3 mg/dL 9.3  9.3  9.1   Total Protein 6.5 - 8.1 g/dL 8.1     Total Bilirubin 0.3 - 1.2 mg/dL 0.5     Alkaline Phos 38 - 126 U/L 56     AST 15 - 41 U/L 14     ALT 0 - 44 U/L 12          RADIOGRAPHIC STUDIES: I have personally reviewed the radiological images as listed and agreed with the findings in the report. DG Chest 2 View  Result Date: 12/12/2021 CLINICAL DATA:  Cough and chest congestion. EXAM: CHEST - 2 VIEW COMPARISON:  Radiograph 01/30/2015, CT 04/29/2021 FINDINGS: Chronic eventration of left hemidiaphragm with adjacent linear atelectasis or scarring at the left lung base. No acute airspace disease. Normal heart  size with stable mediastinal contours. No pulmonary edema. No pleural effusion or pneumothorax. Coarse calcifications in the right breast. No acute osseous abnormalities. IMPRESSION: 1. No acute abnormality. 2. Chronic eventration of left hemidiaphragm with adjacent linear atelectasis or scarring. Electronically Signed   By: Keith Rake M.D.   On: 12/12/2021 23:50   MM Breast Surgical Specimen  Result Date: 10/10/2021 CLINICAL DATA:  Status post [facility] localized left breast lumpectomy. EXAM:  SPECIMEN RADIOGRAPH OF THE left BREAST COMPARISON:  Previous exam(s). FINDINGS: Status post excision of the left breast. The [facility] reflector and heart shaped clip are present within the specimen. IMPRESSION: Specimen radiograph of the left breast. Electronically Signed   By: Kristopher Oppenheim M.D.   On: 10/10/2021 15:23  NM Sentinel Node Inj-No Rpt (Breast)  Result Date: 10/10/2021 Sulfur Colloid was injected by the Nuclear Medicine Technologist for sentinel lymph node localization.

## 2022-01-01 NOTE — Assessment & Plan Note (Addendum)
pathology results were reviewed and discussed with patient. 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 Oncotype Dx recurrence score 17 No chemotherapy benefit.  S/p adjuvant RT Rationale of using aromatase inhibitor -Arimidex  discussed with patient.  Side effects of Arimidex including but not limited to hot flush, joint pain, fatigue, mood swing, osteoporosis discussed with patient. Patient voices understanding and willing to proceed.  Prescription sent to pharmacy

## 2022-01-01 NOTE — Assessment & Plan Note (Signed)
Remote right breast history of cancer status post surgery.-2007 Patient took endocrine therapy for 5 years.  Details unknown.

## 2022-01-01 NOTE — Assessment & Plan Note (Signed)
Recommend patient to take calcium and vitamin D supplementation. 03/21/2021 DEXA at [facility] showed osteopenia.  Plan to repeat DEXA in 2 years.

## 2022-01-01 NOTE — Assessment & Plan Note (Addendum)
I have referred her to genetic counselor.  Patient did not establish care. We discussed about this option today again and if she declined.

## 2022-01-19 ENCOUNTER — Ambulatory Visit: Payer: No Typology Code available for payment source | Admitting: Surgery

## 2022-01-21 ENCOUNTER — Ambulatory Visit (INDEPENDENT_AMBULATORY_CARE_PROVIDER_SITE_OTHER): Payer: No Typology Code available for payment source | Admitting: Surgery

## 2022-01-21 ENCOUNTER — Encounter: Payer: Self-pay | Admitting: Surgery

## 2022-01-21 VITALS — BP 147/77 | HR 72 | Temp 98.3°F | Ht 62.0 in | Wt 151.4 lb

## 2022-01-21 DIAGNOSIS — N611 Abscess of the breast and nipple: Secondary | ICD-10-CM

## 2022-01-21 DIAGNOSIS — C50412 Malignant neoplasm of upper-outer quadrant of left female breast: Secondary | ICD-10-CM | POA: Diagnosis not present

## 2022-01-21 DIAGNOSIS — Z08 Encounter for follow-up examination after completed treatment for malignant neoplasm: Secondary | ICD-10-CM | POA: Diagnosis not present

## 2022-01-21 DIAGNOSIS — Z170 Estrogen receptor positive status [ER+]: Secondary | ICD-10-CM | POA: Diagnosis not present

## 2022-01-21 DIAGNOSIS — Z09 Encounter for follow-up examination after completed treatment for conditions other than malignant neoplasm: Secondary | ICD-10-CM

## 2022-01-21 NOTE — Progress Notes (Signed)
01/21/2022  HPI: Adriana Berg is a 67 y.o. female s/p left breast lumpectomy and SLNBx on 10/10/21 for left breast cancer.  Final pathology showed negative margins and negative lymph nodes, stage T1b, N0.  She completed radiation therapy and is currently on Arimidex.  Patient reports that she's doing well, without any pain complaints.  She reports that the skin over left breast is peeling.  Vital signs: BP (!) 147/77   Pulse 72   Temp 98.3 F (36.8 C) (Oral)   Ht '5\' 2"'$  (1.575 m)   Wt 151 lb 6.4 oz (68.7 kg)   BMI 27.69 kg/m    Physical Exam: Constitutional:  No acute distress Breast:  Left breast lumpectomy scar healing well, with thin scab, no drainage, no palpable masses.  There are radiation skin changes throughout still, but nothing suspicious.  Left axillary incision is healing well, without palpable lymphadenopathy.  Right breast with prior I&D site with a central scab area of about 1 cm.  No drainage or open wound.  Scab cleaned out.  Assessment/Plan: This is a 67 y.o. female s/p left breast lumpectomy and SLNBx.  --Patient is healing well, without complications.  She has completed radiation and is currently on Arimidex.  Recommended that she use moisturizing lotion to help with the peeling skin and changes from radiation.  She can also apply that to the right breast at the scab area so the wound can continue healing better. --Follow up in August with new mammogram.   Melvyn Neth, MD [facility]

## 2022-01-21 NOTE — Patient Instructions (Signed)
If you have any concerns or questions, please feel free to call our office.   Breast Self-Awareness Breast self-awareness is knowing how your breasts look and feel. You need to: Check your breasts on a regular basis. Tell your doctor about any changes. Become familiar with the look and feel of your breasts. This can help you catch a breast problem while it is still small and can be treated. You should do breast self-exams even if you have breast implants. What you need: A mirror. A well-lit room. A pillow or other soft object. How to do a breast self-exam Follow these steps to do a breast self-exam: Look for changes  Take off all the clothes above your waist. Stand in front of a mirror in a room with good lighting. Put your hands down at your sides. Compare your breasts in the mirror. Look for any difference between them, such as: A difference in shape. A difference in size. Wrinkles, dips, and bumps in one breast and not the other. Look at each breast for changes in the skin, such as: Redness. Scaly areas. Skin that has gotten thicker. Dimpling. Open sores (ulcers). Look for changes in your nipples, such as: Fluid coming out of a nipple. Fluid around a nipple. Bleeding. Dimpling. Redness. A nipple that looks pushed in (retracted), or that has changed position. Feel for changes Lie on your back. Feel each breast. To do this: Pick a breast to feel. Place a pillow under the shoulder closest to that breast. Put the arm closest to that breast behind your head. Feel the nipple area of that breast using the hand of your other arm. Feel the area with the pads of your three middle fingers by making small circles with your fingers. Use light, medium, and firm pressure. Continue the overlapping circles, moving downward over the breast. Keep making circles with your fingers. Stop when you feel your ribs. Start making circles with your fingers again, this time going upward until you  reach your collarbone. Then, make circles outward across your breast and into your armpit area. Squeeze your nipple. Check for discharge and lumps. Repeat these steps to check your other breast. Sit or stand in the tub or shower. With soapy water on your skin, feel each breast the same way you did when you were lying down. Write down what you find Writing down what you find can help you remember what to tell your doctor. Write down: What is normal for each breast. Any changes you find in each breast. These include: The kind of changes you find. A tender or painful breast. Any lump you find. Write down its size and where it is. When you last had your monthly period (menstrual cycle). General tips If you are breastfeeding, the best time to check your breasts is after you feed your baby or after you use a breast pump. If you get monthly bleeding, the best time to check your breasts is 5-7 days after your monthly cycle ends. With time, you will become comfortable with the self-exam. You will also start to know if there are changes in your breasts. Contact a doctor if: You see a change in the shape or size of your breasts or nipples. You see a change in the skin of your breast or nipples, such as red or scaly skin. You have fluid coming from your nipples that is not normal. You find a new lump or thick area. You have breast pain. You have any concerns about your   breast health. Summary Breast self-awareness includes looking for changes in your breasts and feeling for changes within your breasts. You should do breast self-awareness in front of a mirror in a well-lit room. If you get monthly periods (menstrual cycles), the best time to check your breasts is 5-7 days after your period ends. Tell your doctor about any changes you see in your breasts. Changes include changes in size, changes on the skin, painful or tender breasts, or fluid from your nipples that is not normal. This information is  not intended to replace advice given to you by your health care provider. Make sure you discuss any questions you have with your health care provider. Document Revised: 05/29/2021 Document Reviewed: 10/24/2020 Elsevier Patient Education  2023 [employer]  

## 2022-01-28 ENCOUNTER — Ambulatory Visit: Payer: No Typology Code available for payment source | Admitting: Radiation Oncology

## 2022-02-18 ENCOUNTER — Ambulatory Visit: Payer: No Typology Code available for payment source | Admitting: Radiation Oncology

## 2022-02-18 DIAGNOSIS — C50012 Malignant neoplasm of nipple and areola, left female breast: Secondary | ICD-10-CM | POA: Insufficient documentation

## 2022-02-18 DIAGNOSIS — Z170 Estrogen receptor positive status [ER+]: Secondary | ICD-10-CM | POA: Insufficient documentation

## 2022-03-04 ENCOUNTER — Encounter: Payer: Self-pay | Admitting: Radiation Oncology

## 2022-03-04 ENCOUNTER — Ambulatory Visit
Admission: RE | Admit: 2022-03-04 | Discharge: 2022-03-04 | Disposition: A | Discharge status: Home / Self Care | Payer: No Typology Code available for payment source | Source: Ambulatory Visit | Attending: Oncology | Admitting: Oncology

## 2022-03-04 VITALS — BP 118/70 | HR 85 | Temp 98.0°F | Resp 16 | Ht 62.0 in | Wt 148.0 lb

## 2022-03-04 DIAGNOSIS — C50012 Malignant neoplasm of nipple and areola, left female breast: Secondary | ICD-10-CM

## 2022-03-04 DIAGNOSIS — Z170 Estrogen receptor positive status [ER+]: Secondary | ICD-10-CM | POA: Diagnosis present

## 2022-03-04 NOTE — Progress Notes (Signed)
Radiation Oncology Follow up Note  Name: Adriana Berg   Date:   03/04/2022 MRN:  [id number] DOB: 01-Jul-1955    This 67 y.o. 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  REFERRING PROVIDER: Denton Lank, MD  HPI: Patient is a 67 year old female now out 1 month having completed whole breast radiation to her left breast for stage Ia (T1b N0 M0) ER/PR positive invasive mammary carcinoma status post wide local excision.  She is seen today in follow-up 1 month out and is doing well she still has some slight tenderness in the breast.  Do not detect any evidence of mastitis.  She otherwise specifically denies cough or bone pain..  COMPLICATIONS OF TREATMENT: none  FOLLOW UP COMPLIANCE: keeps appointments   PHYSICAL EXAM:  BP 118/70 (BP Location: Right Arm, Patient Position: Sitting, Cuff Size: Normal)   Pulse 85   Temp 98 F (36.7 C)   Resp 16   Ht '5\' 2"'$  (1.575 m)   Wt 148 lb (67.1 kg)   BMI 27.07 kg/m  Left breast has some hyperpigmentation no evidence of dominant mass or nodularity is noted in either breast.  No axillary or supraclavicular adenopathy is identified.  Left breast still has some hyperpigmentation no evidence of mastitis.  Well-developed well-nourished patient in NAD. HEENT reveals PERLA, EOMI, discs not visualized.  Oral cavity is clear. No oral mucosal lesions are identified. Neck is clear without evidence of cervical or supraclavicular adenopathy. Lungs are clear to A&P. Cardiac examination is essentially unremarkable with regular rate and rhythm without murmur rub or thrill. Abdomen is benign with no organomegaly or masses noted. Motor sensory and DTR levels are equal and symmetric in the upper and lower extremities. Cranial nerves II through XII are grossly intact. Proprioception is  intact. No peripheral adenopathy or edema is identified. No motor or sensory levels are noted. Crude visual fields are within normal range.  RADIOLOGY RESULTS: No current films for review  PLAN: Present time patient is doing well 1 month out from whole breast radiation and pleased with her overall progress.  I have asked to see her back in 6 months for follow-up.Patient is to call with any concerns.  I would like to take this opportunity to thank you for allowing me to participate in the care of your patient.Noreene Filbert, MD

## 2022-03-29 ENCOUNTER — Other Ambulatory Visit: Payer: Self-pay | Admitting: Acute Care

## 2022-03-29 DIAGNOSIS — Z87891 Personal history of nicotine dependence: Secondary | ICD-10-CM

## 2022-03-29 DIAGNOSIS — F17210 Nicotine dependence, cigarettes, uncomplicated: Secondary | ICD-10-CM

## 2022-03-29 DIAGNOSIS — Z122 Encounter for screening for malignant neoplasm of respiratory organs: Secondary | ICD-10-CM

## 2022-04-02 ENCOUNTER — Encounter: Payer: Self-pay | Admitting: Oncology

## 2022-04-02 ENCOUNTER — Inpatient Hospital Stay: Payer: No Typology Code available for payment source | Attending: Oncology

## 2022-04-02 ENCOUNTER — Inpatient Hospital Stay (HOSPITAL_BASED_OUTPATIENT_CLINIC_OR_DEPARTMENT_OTHER): Payer: No Typology Code available for payment source | Admitting: Oncology

## 2022-04-02 ENCOUNTER — Telehealth: Payer: Self-pay

## 2022-04-02 VITALS — BP 131/78 | HR 69 | Temp 97.2°F | Resp 18 | Wt 148.7 lb

## 2022-04-02 DIAGNOSIS — Z79899 Other long term (current) drug therapy: Secondary | ICD-10-CM | POA: Insufficient documentation

## 2022-04-02 DIAGNOSIS — Z79811 Long term (current) use of aromatase inhibitors: Secondary | ICD-10-CM | POA: Diagnosis not present

## 2022-04-02 DIAGNOSIS — Z87891 Personal history of nicotine dependence: Secondary | ICD-10-CM | POA: Insufficient documentation

## 2022-04-02 DIAGNOSIS — Z90710 Acquired absence of both cervix and uterus: Secondary | ICD-10-CM | POA: Diagnosis not present

## 2022-04-02 DIAGNOSIS — Z7982 Long term (current) use of aspirin: Secondary | ICD-10-CM | POA: Diagnosis not present

## 2022-04-02 DIAGNOSIS — C50811 Malignant neoplasm of overlapping sites of right female breast: Secondary | ICD-10-CM

## 2022-04-02 DIAGNOSIS — Z923 Personal history of irradiation: Secondary | ICD-10-CM | POA: Diagnosis not present

## 2022-04-02 DIAGNOSIS — Z853 Personal history of malignant neoplasm of breast: Secondary | ICD-10-CM

## 2022-04-02 DIAGNOSIS — M8580 Other specified disorders of bone density and structure, unspecified site: Secondary | ICD-10-CM | POA: Diagnosis not present

## 2022-04-02 DIAGNOSIS — I10 Essential (primary) hypertension: Secondary | ICD-10-CM | POA: Insufficient documentation

## 2022-04-02 DIAGNOSIS — Z170 Estrogen receptor positive status [ER+]: Secondary | ICD-10-CM | POA: Diagnosis not present

## 2022-04-02 DIAGNOSIS — C50412 Malignant neoplasm of upper-outer quadrant of left female breast: Secondary | ICD-10-CM | POA: Diagnosis present

## 2022-04-02 DIAGNOSIS — N6459 Other signs and symptoms in breast: Secondary | ICD-10-CM

## 2022-04-02 DIAGNOSIS — L905 Scar conditions and fibrosis of skin: Secondary | ICD-10-CM

## 2022-04-02 LAB — COMPREHENSIVE METABOLIC PANEL
ALT: 14 U/L (ref 0–44)
AST: 14 U/L — ABNORMAL LOW (ref 15–41)
Albumin: 4 g/dL (ref 3.5–5.0)
Alkaline Phosphatase: 53 U/L (ref 38–126)
Anion gap: 6 (ref 5–15)
BUN: 17 mg/dL (ref 8–23)
CO2: 27 mmol/L (ref 22–32)
Calcium: 9.6 mg/dL (ref 8.9–10.3)
Chloride: 107 mmol/L (ref 98–111)
Creatinine, Ser: 1.05 mg/dL — ABNORMAL HIGH (ref 0.44–1.00)
GFR, Estimated: 58 mL/min — ABNORMAL LOW (ref >60)
Glucose, Bld: 97 mg/dL (ref 70–99)
Potassium: 4.4 mmol/L (ref 3.5–5.1)
Sodium: 140 mmol/L (ref 135–145)
Total Bilirubin: 0.6 mg/dL (ref 0.3–1.2)
Total Protein: 7.2 g/dL (ref 6.5–8.1)

## 2022-04-02 LAB — CBC WITH DIFFERENTIAL/PLATELET
Abs Immature Granulocytes: 0.01 10*3/uL (ref 0.00–0.07)
Basophils Absolute: 0 10*3/uL (ref 0.0–0.1)
Basophils Relative: 1 %
Eosinophils Absolute: 0.1 10*3/uL (ref 0.0–0.5)
Eosinophils Relative: 2 %
HCT: 42.2 % (ref 36.0–46.0)
Hemoglobin: 14.2 g/dL (ref 12.0–15.0)
Immature Granulocytes: 0 %
Lymphocytes Relative: 40 %
Lymphs Abs: 1.9 10*3/uL (ref 0.7–4.0)
MCH: 31.6 pg (ref 26.0–34.0)
MCHC: 33.6 g/dL (ref 30.0–36.0)
MCV: 93.8 fL (ref 80.0–100.0)
Monocytes Absolute: 0.5 10*3/uL (ref 0.1–1.0)
Monocytes Relative: 10 %
Neutro Abs: 2.3 10*3/uL (ref 1.7–7.7)
Neutrophils Relative %: 47 %
Platelets: 185 10*3/uL (ref 150–400)
RBC: 4.5 MIL/uL (ref 3.87–5.11)
RDW: 11.9 % (ref 11.5–15.5)
WBC: 4.8 10*3/uL (ref 4.0–10.5)
nRBC: 0 % (ref 0.0–0.2)

## 2022-04-02 NOTE — Progress Notes (Signed)
Hematology/Oncology Progress note [phone] [phone]       CHIEF COMPLAINTS/PURPOSE OF CONSULTATION:  Stage 1A Left breast cancer [2023], history of stage 1 right breast cancer [2007]  ASSESSMENT & PLAN:   Cancer Staging  Breast cancer [facility]) Staging form: Breast, AJCC 8th Edition - Clinical stage from 09/17/2021: Stage IA (cT1b, cN0, cM0, G1, ER+, PR+, HER2-) - Signed by Earlie Server, MD on 09/26/2021 - Pathologic stage from 10/10/2021: Stage IA (pT1b, pN0, cM0, G2, ER+, PR+, HER2-, Oncotype DX score: 17) - Signed by Earlie Server, MD on 11/06/2021   Breast cancer [facility]) pathology results were reviewed and discussed with patient. 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 Oncotype Dx recurrence score 17 No chemotherapy benefit.  S/p adjuvant RT.  Patient declined genetic counseling. Continue Arimidex  1mg  daily, refill sent to pharmacy. Will obtain annual mammogram for surveillance.  August 2024  Focal skin edema, mass like tissue thickening  could be due to previous radiation. Previous surgery site is 2 o'clock. I will obtain diagnostic mammogram of left breast for further evaluation.   History of breast cancer Remote right breast history of cancer status post surgery.-2007 Patient took endocrine therapy for 5 years.  Details unknown.  Aromatase inhibitor use Recommend patient to take calcium and vitamin D supplementation. 03/21/2021 DEXA at [facility] showed osteopenia.  Plan to repeat DEXA in 2 years.  Orders Placed This Encounter  Procedures   MM 3D DIAGNOSTIC MAMMOGRAM BILATERAL BREAST    Standing Status:   Future    Standing Expiration Date:   04/02/2023    Order Specific Question:   Reason for Exam (SYMPTOM  OR DIAGNOSIS REQUIRED)    Answer:   hx breast cancer    Order Specific Question:   Preferred imaging location?    Answer:   [facility]   [place] LIMITED ULTRASOUND INCLUDING AXILLA LEFT BREAST     Standing Status:   Future     Standing Expiration Date:   04/02/2023    Order Specific Question:   Reason for Exam (SYMPTOM  OR DIAGNOSIS REQUIRED)    Answer:   hx breast cancer    Order Specific Question:   Preferred imaging location?    Answer:   [facility]   [place] LIMITED ULTRASOUND INCLUDING AXILLA RIGHT BREAST    Standing Status:   Future    Standing Expiration Date:   04/02/2023    Order Specific Question:   Reason for Exam (SYMPTOM  OR DIAGNOSIS REQUIRED)    Answer:   hx breast cancer    Order Specific Question:   Preferred imaging location?    Answer:   [facility]   CBC with Differential ([place] Only)    Standing Status:   Future    Standing Expiration Date:   04/02/2023   CMP ([place] only)    Standing Status:   Future    Standing Expiration Date:   04/02/2023    Follow up  6 months.  All questions were answered. The patient knows to call the clinic with any problems, questions or concerns.  Earlie Server, MD, PhD [facility] Hematology Oncology 04/02/2022     HISTORY OF PRESENTING ILLNESS:  Adriana Berg 67 y.o. female presents for follow up of Stage 1A Left breast cancer [2023], history of stage 1 right breast cancer [2007] I have reviewed her chart and materials related to her cancer extensively and collaborated history with the patient. Summary of oncologic history is  as follows: Oncology History  Breast cancer ([place])  08/04/2021 Imaging   Bilateral screening mammogram In the right breast, a possible asymmetry warrants further evaluation. Interval resection of RIGHT breast dystrophic calcifications.   In the left breast, possible architectural distortion warrants further evaluation.     08/28/2021 Mammogram   Bilateral diagnostic mammogram showed 1. There is a suspicious 6 mm mass in the LEFT breast at 2 o'clock. Recommend ultrasound-guided biopsy for definitive characterization. Recommend attention on post marker placement mammogram to assess for mammographic/sonographic  correlation. 2. No suspicious LEFT axillary adenopathy. 3. No mammographic evidence of malignancy in the RIGHT breast.     09/17/2021 Cancer Staging   Staging form: Breast, AJCC 8th Edition - Clinical stage from 09/17/2021: Stage IA (cT1b, cN0, cM0, G1, ER+, PR+, HER2-) - Signed by Earlie Server, MD on 09/26/2021 Stage prefix: Initial diagnosis Histologic grading system: 3 grade system   09/26/2021 Initial Diagnosis   Breast cancer ([place])  09/17/2021, left breast mass core biopsy showed invasive mammary carcinoma, no special type.  Grade 1.  DCIS present, LVI not identified, ER+ 90%, PR+ 90%, HER2 negative[score 1+]    10/10/2021 Surgery   Patient underwent left breast lumpectomy and sentinel lymph node biopsy  Pathology showed pT1b pN0 A. breast, left; lumpectomy:  - 8 mm invasive carcinoma of no special type, margins negative (see  below)  B. Sentinel node #1; excision: - No evidence of malignancy in 1 sentinel node.  C.  Sentinel node  #2; excision: - No evidence of malignancy in 2 sentinel nodes.  D.  Sentinel node  #3; excision: - No evidence of malignancy in 2 sentinel nodes.   TUMOR Histologic Type: Invasive carcinoma of no special type Histologic Grade (Not[place] Histologic Score)      Glandular (Acinar)/Tubular Differentiation: 2 (of 3)      Nuclear Pleomorphism: 3 (of 3)      Mitotic Rate: 1 (of 3)      Overall Grade: 2 (of 3) Tumor Size: 8 mm Tumor Focality: Unifocal Ductal Carcinoma In Situ (DCIS): Present      Negative for extensive intraductal component Tumor Extent: Not applicable Lymphatic and/or Vascular Invasion: Not identified Treatment Effect in the Breast: No known presurgical therapy  MARGINS Margin Status for Invasive Carcinoma: All margins negative for invasive carcinoma.      Distance from closest margin: 2.5 mm      Specify closest margin: Anterior  Margin Status for DCIS: All margins negative for DCIS.      Distance from DCIS to closest margin: 2.8  mm      Specify closest margin: Anterior  REGIONAL LYMPH NODES Regional Lymph Node Status: All regional lymph nodes negative for tumor      Total Number of Lymph Nodes Examined (sentinel and non-sentinel): 5       Number of Sentinel Nodes Examined: 5  DISTANT METASTASIS Distant Site(s) Involved, if applicable: Not applicable    99991111 Oncotype testing   Oncotype DX recurrence score 17, distant recurrence risk at 9 years with AI or tamoxifen alone- 5%, absolute chemotherapy benefit < 1%   10/10/2021 Cancer Staging   Staging form: Breast, AJCC 8th Edition - Pathologic stage from 10/10/2021: Stage IA (pT1b, pN0, cM0, G2, ER+, PR+, HER2-, Oncotype DX score: 17) - Signed by Earlie Server, MD on 11/06/2021 Stage prefix: Initial diagnosis Multigene prognostic tests performed: Oncotype DX Recurrence score range: Greater than or equal to 11 Histologic grading system: 3 grade system   11/25/2021 - 12/30/2021  Radiation Therapy   Adjuvant Radiation to breast    Remote stage I right breast cancer in 2007, status post resection.  Patient recalls taking adjuvant endocrine therapy for 5 years.  Details unknown.  Previous pathology report is not available in EMR.  Status post radiation of her breast.  She reports feeling well and tolerates radiation.  No new complaints today.  INTERVAL HISTORY Adriana Berg is a 67 y.o. female who has above history reviewed by me today presents for follow up visit for  Stage 1A Left breast cancer [2023], history of stage 1 right breast cancer [2007] Patient reports feeling well.  She tolerates Arimidex 1 mg daily.  Initially she had some hot flash which has now resolved. She has some skin changes at the previous site of radiation/surgery.   MEDICAL HISTORY:  Past Medical History:  Diagnosis Date   Borderline diabetes    Breast cancer ([place]) 2007   RT LUMPECTOMY, ER +   Lab test positive for detection of COVID-19 virus 12/2018   MRSA (methicillin resistant staph  aureus) culture positive 06/2015   lower abdomen/mons pubis abscess   Personal history of malignant neoplasm of breast    invasive ductal carcinoma with tubular features,Stage 1,ER pos,HER2 neg   Personal history of radiation therapy 2007   Personal history of tobacco use, presenting hazards to health    Pneumonia 02/2015   Radiation 2007   BREAST CA   Unspecified essential hypertension     SURGICAL HISTORY: Past Surgical History:  Procedure Laterality Date   ABDOMINAL HYSTERECTOMY  1995   AXILLARY SENTINEL NODE BIOPSY Right 2007   BREAST BIOPSY Right 2007   CORE - POS   BREAST BIOPSY Right 1995   EXCISIONAL - NEG   BREAST BIOPSY Left 09/17/2021   u/s bx, heart clip. 2:00, path pending.   BREAST LUMPECTOMY Right 2007   breast ca   BREAST LUMPECTOMY,RADIO FREQ LOCALIZER,AXILLARY SENTINEL LYMPH NODE BIOPSY Left 10/10/2021   Procedure: BREAST LUMPECTOMY,RADIO FREQ LOCALIZER,AXILLARY SENTINEL LYMPH NODE BIOPSY;  Surgeon: Olean Ree, MD;  Location: [facility] ORS;  Service: General;  Laterality: Left;   BREAST SURGERY  2007   right lumpectomy   COLONOSCOPY  12/27/2012   COLONOSCOPY WITH PROPOFOL N/A 02/14/2019   Procedure: COLONOSCOPY WITH PROPOFOL;  Surgeon: Jonathon Bellows, MD;  Location: [facility] ENDOSCOPY;  Service: Gastroenterology;  Laterality: N/A;   EXCISION OF BREAST BIOPSY Right 01/28/2021   Procedure: EXCISION OF BREAST abscess;  Surgeon: Olean Ree, MD;  Location: [facility] ORS;  Service: General;  Laterality: Right;    SOCIAL HISTORY: Social History   Socioeconomic History   Marital status: Single    Spouse name: Not on file   Number of children: Not on file   Years of education: Not on file   Highest education level: Not on file  Occupational History   Not on file  Tobacco Use   Smoking status: Some Days    Packs/day: 0.25    Years: 41.00    Additional pack years: 0.00    Total pack years: 10.25    Types: Cigarettes    Passive exposure: Past   Smokeless tobacco:  Never  Vaping Use   Vaping Use: Never used  Substance and Sexual Activity   Alcohol use: No   Drug use: No   Sexual activity: Not on file  Other Topics Concern   Not on file  Social History Narrative   Lives at home with son, Independent at baseline   Social Determinants  of Health   Financial Resource Strain: Medium Risk (10/31/2021)   Overall Financial Resource Strain (CARDIA)    Difficulty of Paying Living Expenses: Somewhat hard  Food Insecurity: No Food Insecurity (10/31/2021)   Hunger Vital Sign    Worried About Running Out of Food in the Last Year: Never true    Ran Out of Food in the Last Year: Never true  Transportation Needs: No Transportation Needs (10/31/2021)   PRAPARE - Hydrologist (Medical): No    Lack of Transportation (Non-Medical): No  Physical Activity: Insufficiently Active (10/31/2021)   Exercise Vital Sign    Days of Exercise per Week: 2 days    Minutes of Exercise per Session: 20 min  Stress: No Stress Concern Present (10/31/2021)   [place]    Feeling of Stress : Only a little  Social Connections: Moderately Isolated (10/31/2021)   Social Connection and Isolation Panel [NHANES]    Frequency of Communication with Friends and Family: More than three times a week    Frequency of Social Gatherings with Friends and Family: More than three times a week    Attends Religious Services: More than 4 times per year    Active Member of [employer] or Organizations: No    Attends Archivist Meetings: Never    Marital Status: Never married  Intimate Partner Violence: Not At Risk (10/31/2021)   Humiliation, Afraid, Rape, and Kick questionnaire    Fear of Current or Ex-Partner: No    Emotionally Abused: No    Physically Abused: No    Sexually Abused: No    FAMILY HISTORY: Family History  Problem Relation Age of Onset   Diabetes Mellitus II Mother    CAD Father     Hypertension Sister    Diabetes Sister    Cancer Sister    Breast cancer Sister 18   Stroke Brother     ALLERGIES:  is allergic to azithromycin.  MEDICATIONS:  Current Outpatient Medications  Medication Sig Dispense Refill   acetaminophen (TYLENOL) 500 MG tablet Take 2 tablets (1,000 mg total) by mouth every 6 (six) hours as needed for mild pain.     amLODipine (NORVASC) 2.5 MG tablet Take 2.5 mg by mouth every morning.     anastrozole (ARIMIDEX) 1 MG tablet Take 1 tablet (1 mg total) by mouth daily. 30 tablet 2   aspirin EC 81 MG tablet Take 81 mg by mouth daily. Swallow whole.     cholecalciferol (VITAMIN D) 25 MCG (1000 UNIT) tablet Take 1,000 Units by mouth daily.     gabapentin (NEURONTIN) 100 MG capsule Take 100 mg by mouth 3 (three) times daily.     ibuprofen (ADVIL) 600 MG tablet Take 1 tablet (600 mg total) by mouth every 8 (eight) hours as needed for moderate pain. 60 tablet 1   lisinopril (ZESTRIL) 40 MG tablet Take 40 mg by mouth every morning.     rosuvastatin (CRESTOR) 20 MG tablet Take 20 mg by mouth at bedtime.     silver sulfADIAZINE (SILVADENE) 1 % cream Apply 1 Application topically daily. 50 g 0   spironolactone (ALDACTONE) 50 MG tablet Take 50 mg by mouth daily.      No current facility-administered medications for this visit.    Review of Systems  Constitutional:  Negative for appetite change, chills, fatigue and fever.  HENT:   Negative for hearing loss and voice change.   Eyes:  Negative for eye problems.  Respiratory:  Negative for chest tightness and cough.   Cardiovascular:  Negative for chest pain.  Gastrointestinal:  Negative for abdominal distention, abdominal pain and blood in stool.  Endocrine: Negative for hot flashes.  Genitourinary:  Negative for difficulty urinating and frequency.   Musculoskeletal:  Negative for arthralgias.  Skin:  Negative for itching and rash.  Neurological:  Negative for extremity weakness.  Hematological:  Negative  for adenopathy.  Psychiatric/Behavioral:  Negative for confusion.      PHYSICAL EXAMINATION: ECOG PERFORMANCE STATUS: 0 - Asymptomatic  Vitals:   04/02/22 0954  BP: 131/78  Pulse: 69  Resp: 18  Temp: (!) 97.2 F (36.2 C)  SpO2: 100%   Filed Weights   04/02/22 0954  Weight: 148 lb 11.2 oz (67.4 kg)    Physical Exam Constitutional:      General: She is not in acute distress.    Appearance: She is not diaphoretic.  HENT:     Head: Normocephalic and atraumatic.     Nose: Nose normal.     Mouth/Throat:     Pharynx: No oropharyngeal exudate.  Eyes:     General: No scleral icterus.    Pupils: Pupils are equal, round, and reactive to light.  Cardiovascular:     Rate and Rhythm: Normal rate and regular rhythm.     Heart sounds: No murmur heard. Pulmonary:     Effort: Pulmonary effort is normal. No respiratory distress.     Breath sounds: No rales.  Chest:     Chest wall: No tenderness.  Abdominal:     General: There is no distension.     Palpations: Abdomen is soft.     Tenderness: There is no abdominal tenderness.  Musculoskeletal:        General: Normal range of motion.     Cervical back: Normal range of motion and neck supple.  Skin:    General: Skin is warm and dry.     Findings: No erythema.  Neurological:     Mental Status: She is alert and oriented to person, place, and time.     Cranial Nerves: No cranial nerve deficit.     Motor: No abnormal muscle tone.     Coordination: Coordination normal.  Psychiatric:        Mood and Affect: Mood and affect normal.    Breast exam was performed in seated and lying down position. Right breast status post lumpectomy with a well-healed surgical scar/indentation close to nipple  Left breast s/p lumpectomy with well healed surgical scar. Left breast 8 o'clock focal skin edema/ tissue thickening and hyperpigmentation.  No palpable axillary lymphadenopathy bilaterally   LABORATORY DATA:  I have reviewed the data as  listed    Latest Ref Rng & Units 04/02/2022    9:43 AM 12/18/2021   11:03 AM 12/04/2021   11:04 AM  CBC  WBC 4.0 - 10.5 K/uL 4.8  4.0  5.2   Hemoglobin 12.0 - 15.0 g/dL 14.2  14.7  15.0   Hematocrit 36.0 - 46.0 % 42.2  42.8  44.7   Platelets 150 - 400 K/uL 185  176  177       Latest Ref Rng & Units 09/26/2021   10:03 AM 01/22/2021    8:35 AM 06/17/2017   11:49 AM  CMP  Glucose 70 - 99 mg/dL 100  86  101   BUN 8 - 23 mg/dL 17  12  9    Creatinine 0.44 -  1.00 mg/dL 0.86  0.96  0.82   Sodium 135 - 145 mmol/L 140  135  139   Potassium 3.5 - 5.1 mmol/L 4.3  4.3  4.0   Chloride 98 - 111 mmol/L 105  103  103   CO2 22 - 32 mmol/L 29  26  26    Calcium 8.9 - 10.3 mg/dL 9.3  9.3  9.1   Total Protein 6.5 - 8.1 g/dL 8.1     Total Bilirubin 0.3 - 1.2 mg/dL 0.5     Alkaline Phos 38 - 126 U/L 56     AST 15 - 41 U/L 14     ALT 0 - 44 U/L 12          RADIOGRAPHIC STUDIES: I have personally reviewed the radiological images as listed and agreed with the findings in the report. No results found.

## 2022-04-02 NOTE — Assessment & Plan Note (Addendum)
pathology results were reviewed and discussed with patient. 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 Oncotype Dx recurrence score 17 No chemotherapy benefit.  S/p adjuvant RT.  Patient declined genetic counseling. Continue Arimidex  1mg  daily, refill sent to pharmacy. Will obtain annual mammogram for surveillance.  August 2024  Focal skin edema, mass like tissue thickening  could be due to previous radiation. Previous surgery site is 2 o'clock. I will obtain diagnostic mammogram of left breast for further evaluation.

## 2022-04-02 NOTE — Assessment & Plan Note (Signed)
Recommend patient to take calcium and vitamin D supplementation. 03/21/2021 DEXA at [facility] showed osteopenia.  Plan to repeat DEXA in 2 years. 

## 2022-04-02 NOTE — Assessment & Plan Note (Signed)
Remote right breast history of cancer status post surgery.-2007 Patient took endocrine therapy for 5 years.  Details unknown. 

## 2022-04-02 NOTE — Telephone Encounter (Signed)
Dr. Tasia Catchings has updated patient on additional mammogram plan:   Please schedule next available and inform pt of appt:   left dag mamo with [place] (area of concern at left breast 8 o clock) Ok to keep future mammo/ [place] as scheduled

## 2022-04-02 NOTE — Progress Notes (Signed)
Pt here for follow up. States she is concerned about scar tissue on left breast.

## 2022-04-02 NOTE — Telephone Encounter (Signed)
-----   Message from Earlie Server, MD sent at 04/02/2022 12:23 PM EDT ----- She has some concern of left breast.  I thought it was related to her surgery, which I thought was due to previous surgery and RT.  again just now I reviewed his previous mammogram just now, her left breast surgery site was at the 2:00, the area of concern she has is 8:00.  I recommend to obtain left diagnostic mammogram with ultrasound for further evaluation.  I called the patient and she is in agreement.  Please arrange.  Thank you

## 2022-04-06 ENCOUNTER — Ambulatory Visit
Admission: RE | Admit: 2022-04-06 | Discharge: 2022-04-06 | Disposition: A | Discharge status: Home / Self Care | Payer: No Typology Code available for payment source | Source: Ambulatory Visit | Attending: Oncology | Admitting: Oncology

## 2022-04-06 DIAGNOSIS — Z853 Personal history of malignant neoplasm of breast: Secondary | ICD-10-CM | POA: Insufficient documentation

## 2022-04-06 DIAGNOSIS — N6459 Other signs and symptoms in breast: Secondary | ICD-10-CM

## 2022-04-09 ENCOUNTER — Other Ambulatory Visit: Payer: Self-pay | Admitting: *Deleted

## 2022-04-09 ENCOUNTER — Telehealth: Payer: Self-pay

## 2022-04-09 MED ORDER — ANASTROZOLE 1 MG PO TABS: 1.0000 mg | ORAL_TABLET | Freq: Every day | ORAL | 1 refills | Status: DC

## 2022-04-09 NOTE — Telephone Encounter (Signed)
-----   Message from Earlie Server, MD sent at 04/08/2022  8:48 PM EDT ----- Mamogram result is good.  Please arrange her to get bilateral diagnostic mammogram  in August 2024.

## 2022-04-09 NOTE — Telephone Encounter (Signed)
Pt informed of Recommendation.   Please schedule bilat diag mammo in Aug and inform pt of appt.

## 2022-05-01 ENCOUNTER — Ambulatory Visit: Admission: RE | Admit: 2022-05-01 | Payer: No Typology Code available for payment source | Source: Ambulatory Visit

## 2022-05-26 ENCOUNTER — Ambulatory Visit: Payer: No Typology Code available for payment source | Attending: Ophthalmology

## 2022-08-27 ENCOUNTER — Other Ambulatory Visit: Payer: Self-pay | Admitting: Oncology

## 2022-08-31 ENCOUNTER — Ambulatory Visit: Admission: RE | Admit: 2022-08-31 | Payer: 59 | Source: Ambulatory Visit

## 2022-08-31 ENCOUNTER — Other Ambulatory Visit: Payer: No Typology Code available for payment source

## 2022-08-31 ENCOUNTER — Ambulatory Visit
Admission: RE | Admit: 2022-08-31 | Discharge: 2022-08-31 | Disposition: A | Discharge status: Home / Self Care | Payer: 59 | Source: Ambulatory Visit | Attending: Oncology | Admitting: Oncology

## 2022-08-31 DIAGNOSIS — C50811 Malignant neoplasm of overlapping sites of right female breast: Secondary | ICD-10-CM | POA: Diagnosis present

## 2022-09-02 ENCOUNTER — Encounter: Payer: Self-pay | Admitting: Radiation Oncology

## 2022-09-02 ENCOUNTER — Ambulatory Visit
Admission: RE | Admit: 2022-09-02 | Discharge: 2022-09-02 | Disposition: A | Discharge status: Home / Self Care | Payer: 59 | Source: Ambulatory Visit | Attending: Radiation Oncology | Admitting: Radiation Oncology

## 2022-09-02 VITALS — BP 116/67 | HR 67 | Temp 97.3°F | Resp 16 | Ht 62.0 in | Wt 148.2 lb

## 2022-09-02 DIAGNOSIS — Z79811 Long term (current) use of aromatase inhibitors: Secondary | ICD-10-CM | POA: Insufficient documentation

## 2022-09-02 DIAGNOSIS — C50012 Malignant neoplasm of nipple and areola, left female breast: Secondary | ICD-10-CM | POA: Diagnosis present

## 2022-09-02 DIAGNOSIS — Z170 Estrogen receptor positive status [ER+]: Secondary | ICD-10-CM | POA: Insufficient documentation

## 2022-09-02 DIAGNOSIS — Z923 Personal history of irradiation: Secondary | ICD-10-CM | POA: Insufficient documentation

## 2022-09-02 NOTE — Progress Notes (Signed)
Radiation Oncology Follow up Note  Name: Adriana Berg   Date:   09/02/2022 MRN:  [id number] DOB: [birth year]    This 67 y.o. female presents to the clinic today for 60-month follow-up status post whole breast radiation to her left breast for stage Ia ER/PR positive invasive mammary carcinoma.  REFERRING PROVIDER: Hillery Aldo, MD  HPI: Patient is a 67 year old female now out 7 months having completed whole breast radiation to her left breast for stage Ia invasive mammary carcinoma.  She is out over 15 years with radiation to her right breast for early stage disease also.  Seen today in routine follow-up.  She is doing well she specifically denies breast tenderness cough or bone pain.  She is currently on Arimidex tolerating it well without side effect.  She had mammograms of this month which I have reviewed were BI-RADS Category 2 benign  COMPLICATIONS OF TREATMENT: none  FOLLOW UP COMPLIANCE: keeps appointments   PHYSICAL EXAM:  BP 116/67   Pulse 67   Temp (!) 97.3 F (36.3 C)   Resp 16   Ht 5\' 2"  (1.575 m)   Wt 148 lb 3.2 oz (67.2 kg)   BMI 27.11 kg/m  Lungs are clear to A&P cardiac examination essentially unremarkable with regular rate and rhythm. No dominant mass or nodularity is noted in either breast in 2 positions examined. Incision is well-healed. No axillary or supraclavicular adenopathy is appreciated. Cosmetic result is good.  Well-developed well-nourished patient in NAD. HEENT reveals PERLA, EOMI, discs not visualized.  Oral cavity is clear. No oral mucosal lesions are identified. Neck is clear without evidence of cervical or supraclavicular adenopathy. Lungs are clear to A&P. Cardiac examination is essentially unremarkable with regular rate and rhythm without murmur rub or thrill. Abdomen is benign with no organomegaly or masses noted. Motor sensory and DTR levels are equal and symmetric in the upper and lower extremities. Cranial nerves II through XII are grossly intact.  Proprioception is intact. No peripheral adenopathy or edema is identified. No motor or sensory levels are noted. Crude visual fields are within normal range.  RADIOLOGY RESULTS: Mammograms reviewed compatible with above-stated findings  PLAN: Present time patient is doing well 7 months out from whole breast radiation and pleased with her overall progress.  I have asked to see her back in 6 months for follow-up.  She continues on Arimidex without side effect.  Patient is to call with any concerns.  I would like to take this opportunity to thank you for allowing me to participate in the care of your patient.Carmina Miller, MD

## 2022-09-14 ENCOUNTER — Encounter: Payer: Self-pay | Admitting: Surgery

## 2022-09-14 ENCOUNTER — Ambulatory Visit: Payer: 59 | Admitting: Surgery

## 2022-09-14 VITALS — BP 134/76 | HR 73 | Temp 98.0°F | Ht 62.0 in | Wt 147.0 lb

## 2022-09-14 DIAGNOSIS — C50412 Malignant neoplasm of upper-outer quadrant of left female breast: Secondary | ICD-10-CM

## 2022-09-14 DIAGNOSIS — Z170 Estrogen receptor positive status [ER+]: Secondary | ICD-10-CM | POA: Diagnosis not present

## 2022-09-14 DIAGNOSIS — N611 Abscess of the breast and nipple: Secondary | ICD-10-CM

## 2022-09-14 NOTE — Patient Instructions (Addendum)
The patient has been asked to return to the office in one year with a bilateral diagnostic mammogram.  We will send you a letter about these appointments.   Continue self breast exams. Call office for any new breast issues or concerns.

## 2022-09-14 NOTE — Progress Notes (Signed)
09/14/2022  History of Present Illness: Adriana Berg is a 67 y.o. female status post left breast lumpectomy and sentinel lymph node biopsy on 10/10/2021 for invasive breast cancer and DCIS.  Lymph nodes were negative and she completed radiation and is currently on Arimidex.  She presents today for a 1 year follow-up.  She had a mammogram on 08/31/2022 which have personally reviewed.  There is only evidence of fat necrosis at the left lumpectomy site but otherwise no suspicious findings.  The patient reports that she has been doing well with some intermittent soreness in the left breast.  Denies any new issues.  Past Medical History: Past Medical History:  Diagnosis Date   Borderline diabetes    Breast cancer (HCC) 2007   RT LUMPECTOMY, ER +   Lab test positive for detection of COVID-19 virus 12/2018   MRSA (methicillin resistant staph aureus) culture positive 06/2015   lower abdomen/mons pubis abscess   Personal history of malignant neoplasm of breast    invasive ductal carcinoma with tubular features,Stage 1,ER pos,HER2 neg   Personal history of radiation therapy 2007   Personal history of tobacco use, presenting hazards to health    Pneumonia 02/2015   Radiation 2007   BREAST CA   Unspecified essential hypertension      Past Surgical History: Past Surgical History:  Procedure Laterality Date   ABDOMINAL HYSTERECTOMY  1995   AXILLARY SENTINEL NODE BIOPSY Right 2007   BREAST BIOPSY Right 2007   CORE - POS   BREAST BIOPSY Right 1995   EXCISIONAL - NEG   BREAST BIOPSY Left 09/17/2021   u/s bx, heart clip. 2:00, path pending.   BREAST LUMPECTOMY Right 2007   breast ca   BREAST LUMPECTOMY Left 10/10/2021   breast cancer   BREAST LUMPECTOMY,RADIO FREQ LOCALIZER,AXILLARY SENTINEL LYMPH NODE BIOPSY Left 10/10/2021   Procedure: BREAST LUMPECTOMY,RADIO FREQ LOCALIZER,AXILLARY SENTINEL LYMPH NODE BIOPSY;  Surgeon: Henrene Dodge, MD;  Location: [facility] ORS;  Service: General;  Laterality:  Left;   BREAST SURGERY  2007   right lumpectomy   COLONOSCOPY  12/27/2012   COLONOSCOPY WITH PROPOFOL N/A 02/14/2019   Procedure: COLONOSCOPY WITH PROPOFOL;  Surgeon: Wyline Mood, MD;  Location: [facility] ENDOSCOPY;  Service: Gastroenterology;  Laterality: N/A;   EXCISION OF BREAST BIOPSY Right 01/28/2021   Procedure: EXCISION OF BREAST abscess;  Surgeon: Henrene Dodge, MD;  Location: [facility] ORS;  Service: General;  Laterality: Right;    Home Medications: Prior to Admission medications   Medication Sig Start Date End Date Taking? Authorizing Provider  acetaminophen (TYLENOL) 500 MG tablet Take 2 tablets (1,000 mg total) by mouth every 6 (six) hours as needed for mild pain. 10/10/21   Roniesha Hollingshead, Elita Quick, MD  amLODipine (NORVASC) 2.5 MG tablet Take 2.5 mg by mouth every morning.    [provider]  anastrozole (ARIMIDEX) 1 MG tablet TAKE 1 TABLET BY MOUTH EVERY DAY 08/27/22   Rickard Patience, MD  aspirin EC 81 MG tablet Take 81 mg by mouth daily. Swallow whole.    [provider]  cholecalciferol (VITAMIN D) 25 MCG (1000 UNIT) tablet Take 1,000 Units by mouth daily. 11/14/20   [provider]  gabapentin (NEURONTIN) 100 MG capsule Take 100 mg by mouth 3 (three) times daily. 11/28/21   [provider]  ibuprofen (ADVIL) 600 MG tablet Take 1 tablet (600 mg total) by mouth every 8 (eight) hours as needed for moderate pain. 10/10/21   Henrene Dodge, MD  lisinopril (ZESTRIL) 40 MG  tablet Take 40 mg by mouth every morning. 01/23/21   [provider]  rosuvastatin (CRESTOR) 20 MG tablet Take 20 mg by mouth at bedtime. 10/13/21   [provider]  silver sulfADIAZINE (SILVADENE) 1 % cream Apply 1 Application topically daily. 12/16/21   Carmina Miller, MD  spironolactone (ALDACTONE) 50 MG tablet Take 50 mg by mouth daily.  06/17/17   [provider]  albuterol (PROVENTIL) (2.5 MG/3ML) 0.083% nebulizer solution Take 3 mLs (2.5 mg total) by nebulization every 6 (six)  hours as needed for wheezing or shortness of breath. 01/19/17 12/18/18  Enid Derry, PA-C    Allergies: Allergies  Allergen Reactions   Azithromycin Swelling    Review of Systems: Review of Systems  Constitutional:  Negative for chills and fever.  Respiratory:  Negative for shortness of breath.   Cardiovascular:  Negative for chest pain.  Gastrointestinal:  Negative for abdominal pain, nausea and vomiting.  Skin:  Negative for rash.    Physical Exam BP 134/76   Pulse 73   Temp 98 F (36.7 C)   Ht 5\' 2"  (1.575 m)   Wt 147 lb (66.7 kg)   SpO2 99%   BMI 26.89 kg/m  CONSTITUTIONAL: Acute distress, well-nourished HEENT:  Normocephalic, atraumatic, extraocular motion intact. RESPIRATORY:  Normal respiratory effort without pathologic use of accessory muscles. CARDIOVASCULAR: Regular rhythm and rate. BREAST: Left breast status post lumpectomy with incision healing well.  There are no palpable masses, skin changes other than radiation changes, or nipple drainage.  Left axillary incision is healed well without any palpable lymphadenopathy.  Right breast status post I&D in the past with incision well-healed.  There are some palpable scar tissue but otherwise no masses, skin changes, or nipple changes.  No right axillary lymphadenopathy. NEUROLOGIC:  Motor and sensation is grossly normal.  Cranial nerves are grossly intact. PSYCH:  Alert and oriented to person, place and time. Affect is normal.  Labs/Imaging: Mammogram on 08/31/2022: FINDINGS: The fat necrosis at the site of her left breast lumpectomy has slightly decreased in size from prior. No suspicious changes are seen at her surgical site. The right breast lumpectomy site is stable. No suspicious calcifications, masses or areas of distortion are seen in the bilateral breasts.   IMPRESSION: Stable bilateral lumpectomy sites. No evidence of malignancy in the bilateral breasts.   RECOMMENDATION: Diagnostic mammogram is  suggested in 1 year. (Code:DM-B-01Y)   I have discussed the findings and recommendations with the patient. If applicable, a reminder letter will be sent to the patient regarding the next appointment.   BI-RADS CATEGORY  2: Benign.  Assessment and Plan: This is a 67 y.o. female status post left breast lumpectomy and sentinel lymph node biopsy.  - Discussed with patient the findings on exam today are reassuring with only some radiation skin changes.  Discussed with her that the soreness is not uncommon from the surgery and scar tissue that create some pulling.  Radiation changes can also contribute to this.  This may improve with time but there may always be some degree of issues going forwards.  Mammogram last month is also reassuring without any suspicious findings. - Patient will follow-up with me in 6 months for another breast exam.  I spent 20 minutes dedicated to the care of this patient on the date of this encounter to include pre-visit review of records, face-to-face time with the patient discussing diagnosis and management, and any post-visit coordination of care.   Howie Ill, MD [facility]

## 2022-09-16 ENCOUNTER — Encounter: Payer: Self-pay | Admitting: Emergency Medicine

## 2022-10-06 ENCOUNTER — Encounter: Payer: Self-pay | Admitting: Oncology

## 2022-10-06 ENCOUNTER — Inpatient Hospital Stay (HOSPITAL_BASED_OUTPATIENT_CLINIC_OR_DEPARTMENT_OTHER): Payer: 59 | Admitting: Oncology

## 2022-10-06 ENCOUNTER — Inpatient Hospital Stay: Payer: 59 | Attending: Oncology

## 2022-10-06 VITALS — BP 125/71 | HR 62 | Temp 96.8°F | Resp 18 | Wt 148.9 lb

## 2022-10-06 DIAGNOSIS — C50412 Malignant neoplasm of upper-outer quadrant of left female breast: Secondary | ICD-10-CM | POA: Insufficient documentation

## 2022-10-06 DIAGNOSIS — Z79811 Long term (current) use of aromatase inhibitors: Secondary | ICD-10-CM | POA: Diagnosis not present

## 2022-10-06 DIAGNOSIS — M8580 Other specified disorders of bone density and structure, unspecified site: Secondary | ICD-10-CM | POA: Diagnosis not present

## 2022-10-06 DIAGNOSIS — C50811 Malignant neoplasm of overlapping sites of right female breast: Secondary | ICD-10-CM | POA: Diagnosis not present

## 2022-10-06 DIAGNOSIS — F17210 Nicotine dependence, cigarettes, uncomplicated: Secondary | ICD-10-CM | POA: Insufficient documentation

## 2022-10-06 DIAGNOSIS — Z170 Estrogen receptor positive status [ER+]: Secondary | ICD-10-CM | POA: Insufficient documentation

## 2022-10-06 DIAGNOSIS — Z923 Personal history of irradiation: Secondary | ICD-10-CM | POA: Diagnosis not present

## 2022-10-06 LAB — CMP (CANCER CENTER ONLY)
ALT: 12 U/L (ref 0–44)
AST: 13 U/L — ABNORMAL LOW (ref 15–41)
Albumin: 3.9 g/dL (ref 3.5–5.0)
Alkaline Phosphatase: 54 U/L (ref 38–126)
Anion gap: 7 (ref 5–15)
BUN: 18 mg/dL (ref 8–23)
CO2: 28 mmol/L (ref 22–32)
Calcium: 9.2 mg/dL (ref 8.9–10.3)
Chloride: 102 mmol/L (ref 98–111)
Creatinine: 0.94 mg/dL (ref 0.44–1.00)
GFR, Estimated: >60 mL/min (ref >60)
Glucose, Bld: 95 mg/dL (ref 70–99)
Potassium: 4.5 mmol/L (ref 3.5–5.1)
Sodium: 137 mmol/L (ref 135–145)
Total Bilirubin: 0.5 mg/dL (ref 0.3–1.2)
Total Protein: 7.2 g/dL (ref 6.5–8.1)

## 2022-10-06 LAB — CBC WITH DIFFERENTIAL (CANCER CENTER ONLY)
Abs Immature Granulocytes: 0.01 10*3/uL (ref 0.00–0.07)
Basophils Absolute: 0 10*3/uL (ref 0.0–0.1)
Basophils Relative: 0 %
Eosinophils Absolute: 0.1 10*3/uL (ref 0.0–0.5)
Eosinophils Relative: 1 %
HCT: 41.8 % (ref 36.0–46.0)
Hemoglobin: 14 g/dL (ref 12.0–15.0)
Immature Granulocytes: 0 %
Lymphocytes Relative: 42 %
Lymphs Abs: 2 10*3/uL (ref 0.7–4.0)
MCH: 31 pg (ref 26.0–34.0)
MCHC: 33.5 g/dL (ref 30.0–36.0)
MCV: 92.7 fL (ref 80.0–100.0)
Monocytes Absolute: 0.4 10*3/uL (ref 0.1–1.0)
Monocytes Relative: 9 %
Neutro Abs: 2.3 10*3/uL (ref 1.7–7.7)
Neutrophils Relative %: 48 %
Platelet Count: 177 10*3/uL (ref 150–400)
RBC: 4.51 MIL/uL (ref 3.87–5.11)
RDW: 11.8 % (ref 11.5–15.5)
WBC Count: 4.8 10*3/uL (ref 4.0–10.5)
nRBC: 0 % (ref 0.0–0.2)

## 2022-10-06 NOTE — Assessment & Plan Note (Addendum)
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 Oncotype Dx recurrence score 17 No chemotherapy benefit.  S/p adjuvant RT.  Patient declined genetic counseling. Continue Arimidex  1mg  daily, started at end of Dec 2023 Will obtain annual mammogram for surveillance.  August 2025

## 2022-10-06 NOTE — Assessment & Plan Note (Signed)
Recommend patient to take calcium and vitamin D supplementation. 03/21/2021 DEXA at [facility] showed osteopenia.  Plan to repeat DEXA in 2 years. 

## 2022-10-06 NOTE — Progress Notes (Signed)
Hematology/Oncology Progress note [phone] [phone]       CHIEF COMPLAINTS/PURPOSE OF CONSULTATION:  Stage 1A Left breast cancer [2023], history of stage 1 right breast cancer [2007]  ASSESSMENT & PLAN:   Cancer Staging  Breast cancer [facility]) Staging form: Breast, AJCC 8th Edition - Clinical stage from 09/17/2021: Stage IA (cT1b, cN0, cM0, G1, ER+, PR+, HER2-) - Signed by Rickard Patience, MD on 09/26/2021 - Pathologic stage from 10/10/2021: Stage IA (pT1b, pN0, cM0, G2, ER+, PR+, HER2-, Oncotype DX score: 17) - Signed by Rickard Patience, MD on 11/06/2021   Breast cancer (HCC) 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 Oncotype Dx recurrence score 17 No chemotherapy benefit.  S/p adjuvant RT.  Patient declined genetic counseling. Continue Arimidex  1mg  daily, started at end of Dec 2023 Will obtain annual mammogram for surveillance.  August 2025    Aromatase inhibitor use Recommend patient to take calcium and vitamin D supplementation. 03/21/2021 DEXA at [facility] showed osteopenia.  Plan to repeat DEXA in 2 years.  Orders Placed This Encounter  Procedures   CBC with Differential (Cancer Center Only)    Standing Status:   Future    Standing Expiration Date:   10/06/2023   CMP (Cancer Center only)    Standing Status:   Future    Standing Expiration Date:   10/06/2023    Follow up  6 months.  All questions were answered. The patient knows to call the clinic with any problems, questions or concerns.  Rickard Patience, MD, PhD [facility] Hematology Oncology 10/06/2022     HISTORY OF PRESENTING ILLNESS:  Adriana Berg 67 y.o. female presents for follow up of Stage 1A Left breast cancer [2023], history of stage 1 right breast cancer [2007] I have reviewed her chart and materials related to her cancer extensively and collaborated history with the patient. Summary of oncologic history is as follows: Oncology History  Breast cancer (HCC)  08/04/2021 Imaging    Bilateral screening mammogram In the right breast, a possible asymmetry warrants further evaluation. Interval resection of RIGHT breast dystrophic calcifications.   In the left breast, possible architectural distortion warrants further evaluation.     08/28/2021 Mammogram   Bilateral diagnostic mammogram showed 1. There is a suspicious 6 mm mass in the LEFT breast at 2 o'clock. Recommend ultrasound-guided biopsy for definitive characterization. Recommend attention on post marker placement mammogram to assess for mammographic/sonographic correlation. 2. No suspicious LEFT axillary adenopathy. 3. No mammographic evidence of malignancy in the RIGHT breast.     09/17/2021 Cancer Staging   Staging form: Breast, AJCC 8th Edition - Clinical stage from 09/17/2021: Stage IA (cT1b, cN0, cM0, G1, ER+, PR+, HER2-) - Signed by Rickard Patience, MD on 09/26/2021 Stage prefix: Initial diagnosis Histologic grading system: 3 grade system   09/26/2021 Initial Diagnosis   Breast cancer (HCC)  09/17/2021, left breast mass core biopsy showed invasive mammary carcinoma, no special type.  Grade 1.  DCIS present, LVI not identified, ER+ 90%, PR+ 90%, HER2 negative[score 1+]    10/10/2021 Surgery   Patient underwent left breast lumpectomy and sentinel lymph node biopsy  Pathology showed pT1b pN0 A. breast, left; lumpectomy:  - 8 mm invasive carcinoma of no special type, margins negative (see  below)  B. Sentinel node #1; excision: - No evidence of malignancy in 1 sentinel node.  C.  Sentinel node  #2; excision: - No evidence of malignancy in 2 sentinel nodes.  D.  Sentinel node  #  3; excision: - No evidence of malignancy in 2 sentinel nodes.   TUMOR Histologic Type: Invasive carcinoma of no special type Histologic Grade (Nottingham Histologic Score)      Glandular (Acinar)/Tubular Differentiation: 2 (of 3)      Nuclear Pleomorphism: 3 (of 3)      Mitotic Rate: 1 (of 3)      Overall Grade: 2 (of 3) Tumor Size:  8 mm Tumor Focality: Unifocal Ductal Carcinoma In Situ (DCIS): Present      Negative for extensive intraductal component Tumor Extent: Not applicable Lymphatic and/or Vascular Invasion: Not identified Treatment Effect in the Breast: No known presurgical therapy  MARGINS Margin Status for Invasive Carcinoma: All margins negative for invasive carcinoma.      Distance from closest margin: 2.5 mm      Specify closest margin: Anterior  Margin Status for DCIS: All margins negative for DCIS.      Distance from DCIS to closest margin: 2.8 mm      Specify closest margin: Anterior  REGIONAL LYMPH NODES Regional Lymph Node Status: All regional lymph nodes negative for tumor      Total Number of Lymph Nodes Examined (sentinel and non-sentinel): 5       Number of Sentinel Nodes Examined: 5  DISTANT METASTASIS Distant Site(s) Involved, if applicable: Not applicable    10/10/2021 Oncotype testing   Oncotype DX recurrence score 17, distant recurrence risk at 9 years with AI or tamoxifen alone- 5%, absolute chemotherapy benefit < 1%   10/10/2021 Cancer Staging   Staging form: Breast, AJCC 8th Edition - Pathologic stage from 10/10/2021: Stage IA (pT1b, pN0, cM0, G2, ER+, PR+, HER2-, Oncotype DX score: 17) - Signed by Rickard Patience, MD on 11/06/2021 Stage prefix: Initial diagnosis Multigene prognostic tests performed: Oncotype DX Recurrence score range: Greater than or equal to 11 Histologic grading system: 3 grade system   11/25/2021 - 12/30/2021 Radiation Therapy   Adjuvant Radiation to breast   08/31/2022 Mammogram   Bilateral diagnostic mammogram showed Stable bilateral lumpectomy sites. No evidence of malignancy in the bilateral breasts.   01/02/2023 -  Anti-estrogen oral therapy   Started on Arimidex 1 mg daily.    Remote stage I right breast cancer in 2007, status post resection.  Patient recalls taking adjuvant endocrine therapy for 5 years.  Details unknown.  Previous pathology report is  not available in EMR.  Status post radiation of her breast.  She reports feeling well and tolerates radiation.  No new complaints today.  INTERVAL HISTORY Adriana Berg is a 67 y.o. female who has above history reviewed by me today presents for follow up visit for  Stage 1A Left breast cancer [2023], history of stage 1 right breast cancer [2007] She tolerates Arimidex 1 mg daily.  Initially she had some hot flash which has now resolved. Patient has no new breast concerns.   MEDICAL HISTORY:  Past Medical History:  Diagnosis Date   Borderline diabetes    Breast cancer (HCC) 2007   RT LUMPECTOMY, ER +   Lab test positive for detection of COVID-19 virus 12/2018   MRSA (methicillin resistant staph aureus) culture positive 06/2015   lower abdomen/mons pubis abscess   Personal history of malignant neoplasm of breast    invasive ductal carcinoma with tubular features,Stage 1,ER pos,HER2 neg   Personal history of radiation therapy 2007   Personal history of tobacco use, presenting hazards to health    Pneumonia 02/2015   Radiation 2007   BREAST  CA   Unspecified essential hypertension     SURGICAL HISTORY: Past Surgical History:  Procedure Laterality Date   ABDOMINAL HYSTERECTOMY  1995   AXILLARY SENTINEL NODE BIOPSY Right 2007   BREAST BIOPSY Right 2007   CORE - POS   BREAST BIOPSY Right 1995   EXCISIONAL - NEG   BREAST BIOPSY Left 09/17/2021   u/s bx, heart clip. 2:00, path pending.   BREAST LUMPECTOMY Right 2007   breast ca   BREAST LUMPECTOMY Left 10/10/2021   breast cancer   BREAST LUMPECTOMY,RADIO FREQ LOCALIZER,AXILLARY SENTINEL LYMPH NODE BIOPSY Left 10/10/2021   Procedure: BREAST LUMPECTOMY,RADIO FREQ LOCALIZER,AXILLARY SENTINEL LYMPH NODE BIOPSY;  Surgeon: Henrene Dodge, MD;  Location: [facility] ORS;  Service: General;  Laterality: Left;   BREAST SURGERY  2007   right lumpectomy   COLONOSCOPY  12/27/2012   COLONOSCOPY WITH PROPOFOL N/A 02/14/2019   Procedure: COLONOSCOPY  WITH PROPOFOL;  Surgeon: Wyline Mood, MD;  Location: [facility] ENDOSCOPY;  Service: Gastroenterology;  Laterality: N/A;   EXCISION OF BREAST BIOPSY Right 01/28/2021   Procedure: EXCISION OF BREAST abscess;  Surgeon: Henrene Dodge, MD;  Location: [facility] ORS;  Service: General;  Laterality: Right;    SOCIAL HISTORY: Social History   Socioeconomic History   Marital status: Single    Spouse name: Not on file   Number of children: Not on file   Years of education: Not on file   Highest education level: Not on file  Occupational History   Not on file  Tobacco Use   Smoking status: Some Days    Current packs/day: 0.25    Average packs/day: 0.3 packs/day for 41.0 years (10.3 ttl pk-yrs)    Types: Cigarettes    Passive exposure: Past   Smokeless tobacco: Never  Vaping Use   Vaping status: Never Used  Substance and Sexual Activity   Alcohol use: No   Drug use: No   Sexual activity: Not on file  Other Topics Concern   Not on file  Social History Narrative   Lives at home with son, Independent at baseline   Social Determinants of Health   Financial Resource Strain: Medium Risk (10/31/2021)   Overall Financial Resource Strain (CARDIA)    Difficulty of Paying Living Expenses: Somewhat hard  Food Insecurity: No Food Insecurity (10/31/2021)   Hunger Vital Sign    Worried About Running Out of Food in the Last Year: Never true    Ran Out of Food in the Last Year: Never true  Transportation Needs: No Transportation Needs (10/31/2021)   PRAPARE - Administrator, Civil Service (Medical): No    Lack of Transportation (Non-Medical): No  Physical Activity: Insufficiently Active (10/31/2021)   Exercise Vital Sign    Days of Exercise per Week: 2 days    Minutes of Exercise per Session: 20 min  Stress: No Stress Concern Present (10/31/2021)   [employer] of Occupational Health - Occupational Stress Questionnaire    Feeling of Stress : Only a little  Social Connections: Moderately  Isolated (10/31/2021)   Social Connection and Isolation Panel [NHANES]    Frequency of Communication with Friends and Family: More than three times a week    Frequency of Social Gatherings with Friends and Family: More than three times a week    Attends Religious Services: More than 4 times per year    Active Member of [employer] or Organizations: No    Attends Banker Meetings: Never    Marital Status: Never married  Intimate Partner Violence: Not At Risk (10/31/2021)   Humiliation, Afraid, Rape, and Kick questionnaire    Fear of Current or Ex-Partner: No    Emotionally Abused: No    Physically Abused: No    Sexually Abused: No    FAMILY HISTORY: Family History  Problem Relation Age of Onset   Diabetes Mellitus II Mother    CAD Father    Hypertension Sister    Diabetes Sister    Cancer Sister    Breast cancer Sister 9   Stroke Brother     ALLERGIES:  is allergic to azithromycin.  MEDICATIONS:  Current Outpatient Medications  Medication Sig Dispense Refill   acetaminophen (TYLENOL) 500 MG tablet Take 2 tablets (1,000 mg total) by mouth every 6 (six) hours as needed for mild pain.     amLODipine (NORVASC) 2.5 MG tablet Take 2.5 mg by mouth every morning.     anastrozole (ARIMIDEX) 1 MG tablet TAKE 1 TABLET BY MOUTH EVERY DAY 90 tablet 1   aspirin EC 81 MG tablet Take 81 mg by mouth daily. Swallow whole.     cholecalciferol (VITAMIN D) 25 MCG (1000 UNIT) tablet Take 1,000 Units by mouth daily.     gabapentin (NEURONTIN) 100 MG capsule Take 100 mg by mouth 3 (three) times daily.     ibuprofen (ADVIL) 600 MG tablet Take 1 tablet (600 mg total) by mouth every 8 (eight) hours as needed for moderate pain. 60 tablet 1   lisinopril (ZESTRIL) 40 MG tablet Take 40 mg by mouth every morning.     rosuvastatin (CRESTOR) 20 MG tablet Take 20 mg by mouth at bedtime.     silver sulfADIAZINE (SILVADENE) 1 % cream Apply 1 Application topically daily. 50 g 0   spironolactone  (ALDACTONE) 50 MG tablet Take 50 mg by mouth daily.      No current facility-administered medications for this visit.    Review of Systems  Constitutional:  Negative for appetite change, chills, fatigue and fever.  HENT:   Negative for hearing loss and voice change.   Eyes:  Negative for eye problems.  Respiratory:  Negative for chest tightness and cough.   Cardiovascular:  Negative for chest pain.  Gastrointestinal:  Negative for abdominal distention, abdominal pain and blood in stool.  Endocrine: Negative for hot flashes.  Genitourinary:  Negative for difficulty urinating and frequency.   Musculoskeletal:  Negative for arthralgias.  Skin:  Negative for itching and rash.  Neurological:  Negative for extremity weakness.  Hematological:  Negative for adenopathy.  Psychiatric/Behavioral:  Negative for confusion.      PHYSICAL EXAMINATION: ECOG PERFORMANCE STATUS: 0 - Asymptomatic  Vitals:   10/06/22 0948  BP: 125/71  Pulse: 62  Resp: 18  Temp: (!) 96.8 F (36 C)   Filed Weights   10/06/22 0948  Weight: 148 lb 14.4 oz (67.5 kg)    Physical Exam Constitutional:      General: She is not in acute distress.    Appearance: She is not diaphoretic.  HENT:     Head: Normocephalic and atraumatic.     Nose: Nose normal.     Mouth/Throat:     Pharynx: No oropharyngeal exudate.  Eyes:     General: No scleral icterus.    Pupils: Pupils are equal, round, and reactive to light.  Cardiovascular:     Rate and Rhythm: Normal rate and regular rhythm.     Heart sounds: No murmur heard. Pulmonary:     Effort: Pulmonary  effort is normal. No respiratory distress.     Breath sounds: No rales.  Chest:     Chest wall: No tenderness.  Abdominal:     General: There is no distension.     Palpations: Abdomen is soft.     Tenderness: There is no abdominal tenderness.  Musculoskeletal:        General: Normal range of motion.     Cervical back: Normal range of motion and neck supple.   Skin:    General: Skin is warm and dry.     Findings: No erythema.  Neurological:     Mental Status: She is alert and oriented to person, place, and time.     Cranial Nerves: No cranial nerve deficit.     Motor: No abnormal muscle tone.     Coordination: Coordination normal.  Psychiatric:        Mood and Affect: Mood and affect normal.       LABORATORY DATA:  I have reviewed the data as listed    Latest Ref Rng & Units 10/06/2022    9:34 AM 04/02/2022    9:43 AM 12/18/2021   11:03 AM  CBC  WBC 4.0 - 10.5 K/uL 4.8  4.8  4.0   Hemoglobin 12.0 - 15.0 g/dL 16.1  09.6  04.5   Hematocrit 36.0 - 46.0 % 41.8  42.2  42.8   Platelets 150 - 400 K/uL 177  185  176       Latest Ref Rng & Units 10/06/2022    9:33 AM 04/02/2022    9:43 AM 09/26/2021   10:03 AM  CMP  Glucose 70 - 99 mg/dL 95  97  409   BUN 8 - 23 mg/dL 18  17  17    Creatinine 0.44 - 1.00 mg/dL 8.11  9.14  7.82   Sodium 135 - 145 mmol/L 137  140  140   Potassium 3.5 - 5.1 mmol/L 4.5  4.4  4.3   Chloride 98 - 111 mmol/L 102  107  105   CO2 22 - 32 mmol/L 28  27  29    Calcium 8.9 - 10.3 mg/dL 9.2  9.6  9.3   Total Protein 6.5 - 8.1 g/dL 7.2  7.2  8.1   Total Bilirubin 0.3 - 1.2 mg/dL 0.5  0.6  0.5   Alkaline Phos 38 - 126 U/L 54  53  56   AST 15 - 41 U/L 13  14  14    ALT 0 - 44 U/L 12  14  12         RADIOGRAPHIC STUDIES: I have personally reviewed the radiological images as listed and agreed with the findings in the report. MM 3D DIAGNOSTIC MAMMOGRAM BILATERAL BREAST  Result Date: 08/31/2022 CLINICAL DATA:  67 year old female presenting for history of left breast lumpectomy in October of 2023. She also has history of remote right breast lumpectomy. EXAM: DIGITAL DIAGNOSTIC BILATERAL MAMMOGRAM WITH TOMOSYNTHESIS AND CAD TECHNIQUE: Bilateral digital diagnostic mammography and breast tomosynthesis was performed. The images were evaluated with computer-aided detection. COMPARISON:  Previous exam(s). ACR Breast Density  Category b: There are scattered areas of fibroglandular density. FINDINGS: The fat necrosis at the site of her left breast lumpectomy has slightly decreased in size from prior. No suspicious changes are seen at her surgical site. The right breast lumpectomy site is stable. No suspicious calcifications, masses or areas of distortion are seen in the bilateral breasts. IMPRESSION: Stable bilateral lumpectomy sites. No evidence of malignancy in  the bilateral breasts. RECOMMENDATION: Diagnostic mammogram is suggested in 1 year. (Code:DM-B-01Y) I have discussed the findings and recommendations with the patient. If applicable, a reminder letter will be sent to the patient regarding the next appointment. BI-RADS CATEGORY  2: Benign. Electronically Signed   By: Frederico Hamman M.D.   On: 08/31/2022 10:20

## 2022-10-07 ENCOUNTER — Telehealth: Payer: Self-pay | Admitting: Acute Care

## 2022-10-07 NOTE — Telephone Encounter (Signed)
Patient received a letter in the mail for a lung cancer screening appointment. Please call back to get appointment scheduled.

## 2022-10-07 NOTE — Telephone Encounter (Signed)
Scheduled for 10/20/22 and confirmed

## 2022-10-20 ENCOUNTER — Ambulatory Visit
Admission: RE | Admit: 2022-10-20 | Discharge: 2022-10-20 | Disposition: A | Discharge status: Home / Self Care | Payer: 59 | Source: Ambulatory Visit | Attending: Acute Care | Admitting: Acute Care

## 2022-10-20 DIAGNOSIS — F17210 Nicotine dependence, cigarettes, uncomplicated: Secondary | ICD-10-CM | POA: Diagnosis present

## 2022-10-20 DIAGNOSIS — Z87891 Personal history of nicotine dependence: Secondary | ICD-10-CM | POA: Diagnosis present

## 2022-10-20 DIAGNOSIS — Z122 Encounter for screening for malignant neoplasm of respiratory organs: Secondary | ICD-10-CM | POA: Diagnosis present

## 2022-10-30 ENCOUNTER — Other Ambulatory Visit: Payer: Self-pay | Admitting: Radiation Oncology

## 2022-11-04 ENCOUNTER — Other Ambulatory Visit: Payer: Self-pay | Admitting: Acute Care

## 2022-11-04 DIAGNOSIS — Z122 Encounter for screening for malignant neoplasm of respiratory organs: Secondary | ICD-10-CM

## 2022-11-04 DIAGNOSIS — Z87891 Personal history of nicotine dependence: Secondary | ICD-10-CM

## 2022-11-04 DIAGNOSIS — F17210 Nicotine dependence, cigarettes, uncomplicated: Secondary | ICD-10-CM

## 2023-03-15 ENCOUNTER — Ambulatory Visit: Payer: 59 | Admitting: Radiation Oncology

## 2023-03-24 ENCOUNTER — Encounter: Payer: Self-pay | Admitting: Radiation Oncology

## 2023-03-24 ENCOUNTER — Ambulatory Visit
Admission: RE | Admit: 2023-03-24 | Discharge: 2023-03-24 | Disposition: A | Discharge status: Home / Self Care | Payer: 59 | Source: Ambulatory Visit | Attending: Radiation Oncology | Admitting: Radiation Oncology

## 2023-03-24 VITALS — BP 137/72 | HR 70 | Temp 97.4°F | Resp 16

## 2023-03-24 DIAGNOSIS — Z923 Personal history of irradiation: Secondary | ICD-10-CM | POA: Diagnosis not present

## 2023-03-24 DIAGNOSIS — Z170 Estrogen receptor positive status [ER+]: Secondary | ICD-10-CM | POA: Diagnosis not present

## 2023-03-24 DIAGNOSIS — C50012 Malignant neoplasm of nipple and areola, left female breast: Secondary | ICD-10-CM | POA: Diagnosis present

## 2023-03-24 DIAGNOSIS — Z79811 Long term (current) use of aromatase inhibitors: Secondary | ICD-10-CM | POA: Insufficient documentation

## 2023-03-24 NOTE — Progress Notes (Signed)
 Radiation Oncology Follow up Note  Name: Adriana Berg   Date:   03/24/2023 MRN:  [id number] DOB: [birth year]    This 68 y.o. 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  REFERRING PROVIDER: Hillery Aldo, MD  HPI: Patient is a 68 year old female now out 13 months having pleated whole breast radiation to her left breast for stage Ia ER/PR positive invasive mammary carcinoma.  She is out over 16 years of radiation to her right breast for early stage breast cancer.  She is seen today and is without complaint.  She still has some slight tenderness in her left breast which is most likely postsurgical.  She had mammograms back in August which I have reviewed.  Were BI-RADS 2 benign.  She also had CT scan of her chest for lung screening which was lung RADS 2 by and benign appearance.  She is currently on Arimidex tolerating that well without side effect.  COMPLICATIONS OF TREATMENT: none  FOLLOW UP COMPLIANCE: keeps appointments   PHYSICAL EXAM:  BP 137/72   Pulse 70   Temp (!) 97.4 F (36.3 C) (Tympanic)   Resp 16  On breast examination there is still some solid scar tissue most likely calcified blood in her previous old right lumpectomy scar.  This is unchanged over time no other dominant masses noted in either breast no axillary or supraclavicular adenopathy is appreciated.  Well-developed well-nourished patient in NAD. HEENT reveals PERLA, EOMI, discs not visualized.  Oral cavity is clear. No oral mucosal lesions are identified. Neck is clear without evidence of cervical or supraclavicular adenopathy. Lungs are clear to A&P. Cardiac examination is essentially unremarkable with regular rate and rhythm without murmur rub or thrill. Abdomen is benign with no organomegaly or masses noted. Motor sensory and DTR levels are equal and symmetric in the upper and lower extremities.  Cranial nerves II through XII are grossly intact. Proprioception is intact. No peripheral adenopathy or edema is identified. No motor or sensory levels are noted. Crude visual fields are within normal range. RADIOLOGY RESULTS: Mammograms and CT scan reviewed compatible with above-stated findings  PLAN: At the present time patient is doing well with no evidence of disease now out 13 months from whole breast radiation.  And pleased with her overall progress.  Of asked to see her back in 1 year for follow-up.  Patient knows to call sooner with any concerns.  I would like to take this opportunity to thank you for allowing me to participate in the care of your patient.Carmina Miller, MD

## 2023-04-06 ENCOUNTER — Inpatient Hospital Stay (HOSPITAL_BASED_OUTPATIENT_CLINIC_OR_DEPARTMENT_OTHER): Payer: 59 | Admitting: Oncology

## 2023-04-06 ENCOUNTER — Encounter: Payer: Self-pay | Admitting: Oncology

## 2023-04-06 ENCOUNTER — Inpatient Hospital Stay: Payer: 59 | Attending: Oncology

## 2023-04-06 VITALS — BP 138/72 | HR 63 | Temp 97.4°F | Resp 18 | Wt 147.1 lb

## 2023-04-06 DIAGNOSIS — C50412 Malignant neoplasm of upper-outer quadrant of left female breast: Secondary | ICD-10-CM | POA: Diagnosis present

## 2023-04-06 DIAGNOSIS — Z1721 Progesterone receptor positive status: Secondary | ICD-10-CM | POA: Insufficient documentation

## 2023-04-06 DIAGNOSIS — Z923 Personal history of irradiation: Secondary | ICD-10-CM | POA: Insufficient documentation

## 2023-04-06 DIAGNOSIS — Z79811 Long term (current) use of aromatase inhibitors: Secondary | ICD-10-CM | POA: Diagnosis not present

## 2023-04-06 DIAGNOSIS — Z853 Personal history of malignant neoplasm of breast: Secondary | ICD-10-CM

## 2023-04-06 DIAGNOSIS — M8580 Other specified disorders of bone density and structure, unspecified site: Secondary | ICD-10-CM

## 2023-04-06 DIAGNOSIS — C50811 Malignant neoplasm of overlapping sites of right female breast: Secondary | ICD-10-CM

## 2023-04-06 DIAGNOSIS — Z170 Estrogen receptor positive status [ER+]: Secondary | ICD-10-CM | POA: Diagnosis not present

## 2023-04-06 DIAGNOSIS — C50312 Malignant neoplasm of lower-inner quadrant of left female breast: Secondary | ICD-10-CM | POA: Insufficient documentation

## 2023-04-06 DIAGNOSIS — Z1732 Human epidermal growth factor receptor 2 negative status: Secondary | ICD-10-CM | POA: Diagnosis not present

## 2023-04-06 LAB — CMP (CANCER CENTER ONLY)
ALT: 14 U/L (ref 0–44)
AST: 14 U/L — ABNORMAL LOW (ref 15–41)
Albumin: 4 g/dL (ref 3.5–5.0)
Alkaline Phosphatase: 58 U/L (ref 38–126)
Anion gap: 5 (ref 5–15)
BUN: 17 mg/dL (ref 8–23)
CO2: 27 mmol/L (ref 22–32)
Calcium: 9.4 mg/dL (ref 8.9–10.3)
Chloride: 105 mmol/L (ref 98–111)
Creatinine: 1.12 mg/dL — ABNORMAL HIGH (ref 0.44–1.00)
GFR, Estimated: 53 mL/min — ABNORMAL LOW (ref >60)
Glucose, Bld: 82 mg/dL (ref 70–99)
Potassium: 4.6 mmol/L (ref 3.5–5.1)
Sodium: 137 mmol/L (ref 135–145)
Total Bilirubin: 0.6 mg/dL (ref 0.0–1.2)
Total Protein: 7.4 g/dL (ref 6.5–8.1)

## 2023-04-06 LAB — CBC WITH DIFFERENTIAL (CANCER CENTER ONLY)
Abs Immature Granulocytes: 0.01 10*3/uL (ref 0.00–0.07)
Basophils Absolute: 0 10*3/uL (ref 0.0–0.1)
Basophils Relative: 0 %
Eosinophils Absolute: 0.1 10*3/uL (ref 0.0–0.5)
Eosinophils Relative: 2 %
HCT: 44.9 % (ref 36.0–46.0)
Hemoglobin: 14.8 g/dL (ref 12.0–15.0)
Immature Granulocytes: 0 %
Lymphocytes Relative: 51 %
Lymphs Abs: 2.4 10*3/uL (ref 0.7–4.0)
MCH: 31.2 pg (ref 26.0–34.0)
MCHC: 33 g/dL (ref 30.0–36.0)
MCV: 94.7 fL (ref 80.0–100.0)
Monocytes Absolute: 0.4 10*3/uL (ref 0.1–1.0)
Monocytes Relative: 9 %
Neutro Abs: 1.8 10*3/uL (ref 1.7–7.7)
Neutrophils Relative %: 38 %
Platelet Count: 192 10*3/uL (ref 150–400)
RBC: 4.74 MIL/uL (ref 3.87–5.11)
RDW: 12.1 % (ref 11.5–15.5)
WBC Count: 4.8 10*3/uL (ref 4.0–10.5)
nRBC: 0 % (ref 0.0–0.2)

## 2023-04-06 MED ORDER — ANASTROZOLE 1 MG PO TABS: 1.0000 mg | ORAL_TABLET | Freq: Every day | ORAL | 1 refills | Status: DC

## 2023-04-06 NOTE — Progress Notes (Signed)
 Hematology/Oncology Progress note [phone] [phone]       CHIEF COMPLAINTS/PURPOSE OF CONSULTATION:  Stage 1A Left breast cancer [2023], history of stage 1 right breast cancer [2007]  ASSESSMENT & PLAN:   Cancer Staging  Breast cancer [facility]) Staging form: Breast, AJCC 8th Edition - Clinical stage from 09/17/2021: Stage IA (cT1b, cN0, cM0, G1, ER+, PR+, HER2-) - Signed by Rickard Patience, MD on 09/26/2021 - Pathologic stage from 10/10/2021: Stage IA (pT1b, pN0, cM0, G2, ER+, PR+, HER2-, Oncotype DX score: 17) - Signed by Rickard Patience, MD on 11/06/2021   Breast cancer (HCC) 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 Oncotype Dx recurrence score 17 No chemotherapy benefit.  S/p adjuvant RT.  Patient declined genetic counseling. Continue Arimidex  1mg  daily [since Dec 2023] Will obtain annual mammogram for surveillance.  August 2025    Osteopenia Recommend patient to take calcium 1200mg  and vitamin D supplementation. 03/21/2021 DEXA at [facility] showed osteopenia.  Plan to repeat DEXA in 2 years.  History of breast cancer Remote right breast history of cancer status post surgery.-2007 Patient took endocrine therapy for 5 years.  Details unknown.  Orders Placed This Encounter  Procedures   MM 3D SCREENING MAMMOGRAM BILATERAL BREAST    Standing Status:   Future    Expected Date:   08/06/2023    Expiration Date:   04/05/2024    Reason for Exam (SYMPTOM  OR DIAGNOSIS REQUIRED):   hx breast cancer    Preferred imaging location?:   [facility]   CMP (Cancer Center only)    Standing Status:   Future    Expected Date:   10/06/2023    Expiration Date:   04/05/2024   CBC with Differential (Cancer Center Only)    Standing Status:   Future    Expected Date:   10/06/2023    Expiration Date:   04/05/2024    Follow up  6 months.  All questions were answered. The patient knows to call the clinic with any problems, questions or concerns.  Rickard Patience, MD,  PhD [facility] Hematology Oncology 04/06/2023     HISTORY OF PRESENTING ILLNESS:  Adriana Berg 68 y.o. female presents for follow up of Stage 1A Left breast cancer [2023], history of stage 1 right breast cancer [2007] I have reviewed her chart and materials related to her cancer extensively and collaborated history with the patient. Summary of oncologic history is as follows: Oncology History  Breast cancer (HCC)  08/04/2021 Imaging   Bilateral screening mammogram In the right breast, a possible asymmetry warrants further evaluation. Interval resection of RIGHT breast dystrophic calcifications.   In the left breast, possible architectural distortion warrants further evaluation.     08/28/2021 Mammogram   Bilateral diagnostic mammogram showed 1. There is a suspicious 6 mm mass in the LEFT breast at 2 o'clock. Recommend ultrasound-guided biopsy for definitive characterization. Recommend attention on post marker placement mammogram to assess for mammographic/sonographic correlation. 2. No suspicious LEFT axillary adenopathy. 3. No mammographic evidence of malignancy in the RIGHT breast.     09/17/2021 Cancer Staging   Staging form: Breast, AJCC 8th Edition - Clinical stage from 09/17/2021: Stage IA (cT1b, cN0, cM0, G1, ER+, PR+, HER2-) - Signed by Rickard Patience, MD on 09/26/2021 Stage prefix: Initial diagnosis Histologic grading system: 3 grade system   09/26/2021 Initial Diagnosis   Breast cancer (HCC)  09/17/2021, left breast mass core biopsy showed invasive mammary carcinoma, no special type.  Grade  1.  DCIS present, LVI not identified, ER+ 90%, PR+ 90%, HER2 negative[score 1+]    10/10/2021 Surgery   Patient underwent left breast lumpectomy and sentinel lymph node biopsy  Pathology showed pT1b pN0 A. breast, left; lumpectomy:  - 8 mm invasive carcinoma of no special type, margins negative (see  below)  B. Sentinel node #1; excision: - No evidence of malignancy in 1 sentinel node.   C.  Sentinel node  #2; excision: - No evidence of malignancy in 2 sentinel nodes.  D.  Sentinel node  #3; excision: - No evidence of malignancy in 2 sentinel nodes.   TUMOR Histologic Type: Invasive carcinoma of no special type Histologic Grade (Nottingham Histologic Score)      Glandular (Acinar)/Tubular Differentiation: 2 (of 3)      Nuclear Pleomorphism: 3 (of 3)      Mitotic Rate: 1 (of 3)      Overall Grade: 2 (of 3) Tumor Size: 8 mm Tumor Focality: Unifocal Ductal Carcinoma In Situ (DCIS): Present      Negative for extensive intraductal component Tumor Extent: Not applicable Lymphatic and/or Vascular Invasion: Not identified Treatment Effect in the Breast: No known presurgical therapy  MARGINS Margin Status for Invasive Carcinoma: All margins negative for invasive carcinoma.      Distance from closest margin: 2.5 mm      Specify closest margin: Anterior  Margin Status for DCIS: All margins negative for DCIS.      Distance from DCIS to closest margin: 2.8 mm      Specify closest margin: Anterior  REGIONAL LYMPH NODES Regional Lymph Node Status: All regional lymph nodes negative for tumor      Total Number of Lymph Nodes Examined (sentinel and non-sentinel): 5       Number of Sentinel Nodes Examined: 5  DISTANT METASTASIS Distant Site(s) Involved, if applicable: Not applicable    10/10/2021 Oncotype testing   Oncotype DX recurrence score 17, distant recurrence risk at 9 years with AI or tamoxifen alone- 5%, absolute chemotherapy benefit < 1%   10/10/2021 Cancer Staging   Staging form: Breast, AJCC 8th Edition - Pathologic stage from 10/10/2021: Stage IA (pT1b, pN0, cM0, G2, ER+, PR+, HER2-, Oncotype DX score: 17) - Signed by Rickard Patience, MD on 11/06/2021 Stage prefix: Initial diagnosis Multigene prognostic tests performed: Oncotype DX Recurrence score range: Greater than or equal to 11 Histologic grading system: 3 grade system   11/25/2021 - 12/30/2021 Radiation Therapy    Adjuvant Radiation to breast   08/31/2022 Mammogram   Bilateral diagnostic mammogram showed Stable bilateral lumpectomy sites. No evidence of malignancy in the bilateral breasts.   01/02/2023 -  Anti-estrogen oral therapy   Started on Arimidex 1 mg daily.    Remote stage I right breast cancer in 2007, status post resection.  Patient recalls taking adjuvant endocrine therapy for 5 years.  Details unknown.  Previous pathology report is not available in EMR.  Status post radiation of her breast.  She reports feeling well and tolerates radiation.  No new complaints today.  INTERVAL HISTORY Adriana Berg is a 68 y.o. female who has above history reviewed by me today presents for follow up visit for  Stage 1A Left breast cancer [2023], history of stage 1 right breast cancer [2007] She tolerates Arimidex 1 mg daily.  Initially she had some hot flash which has now resolved. Patient has no new breast concerns.   MEDICAL HISTORY:  Past Medical History:  Diagnosis Date   Borderline  diabetes    Breast cancer (HCC) 2007   RT LUMPECTOMY, ER +   Lab test positive for detection of COVID-19 virus 12/2018   MRSA (methicillin resistant staph aureus) culture positive 06/2015   lower abdomen/mons pubis abscess   Personal history of malignant neoplasm of breast    invasive ductal carcinoma with tubular features,Stage 1,ER pos,HER2 neg   Personal history of radiation therapy 2007   Personal history of tobacco use, presenting hazards to health    Pneumonia 02/2015   Radiation 2007   BREAST CA   Unspecified essential hypertension     SURGICAL HISTORY: Past Surgical History:  Procedure Laterality Date   ABDOMINAL HYSTERECTOMY  1995   AXILLARY SENTINEL NODE BIOPSY Right 2007   BREAST BIOPSY Right 2007   CORE - POS   BREAST BIOPSY Right 1995   EXCISIONAL - NEG   BREAST BIOPSY Left 09/17/2021   u/s bx, heart clip. 2:00, path pending.   BREAST LUMPECTOMY Right 2007   breast ca   BREAST  LUMPECTOMY Left 10/10/2021   breast cancer   BREAST LUMPECTOMY,RADIO FREQ LOCALIZER,AXILLARY SENTINEL LYMPH NODE BIOPSY Left 10/10/2021   Procedure: BREAST LUMPECTOMY,RADIO FREQ LOCALIZER,AXILLARY SENTINEL LYMPH NODE BIOPSY;  Surgeon: Henrene Dodge, MD;  Location: [facility] ORS;  Service: General;  Laterality: Left;   BREAST SURGERY  2007   right lumpectomy   COLONOSCOPY  12/27/2012   COLONOSCOPY WITH PROPOFOL N/A 02/14/2019   Procedure: COLONOSCOPY WITH PROPOFOL;  Surgeon: Wyline Mood, MD;  Location[facility] ENDOSCOPY;  Service: Gastroenterology;  Laterality: N/A;   EXCISION OF BREAST BIOPSY Right 01/28/2021   Procedure: EXCISION OF BREAST abscess;  Surgeon: Henrene Dodge, MD;  Location: [facility] ORS;  Service: General;  Laterality: Right;    SOCIAL HISTORY: Social History   Socioeconomic History   Marital status: Single    Spouse name: Not on file   Number of children: Not on file   Years of education: Not on file   Highest education level: Not on file  Occupational History   Not on file  Tobacco Use   Smoking status: Some Days    Current packs/day: 0.25    Average packs/day: 0.3 packs/day for 41.0 years (10.3 ttl pk-yrs)    Types: Cigarettes    Passive exposure: Past   Smokeless tobacco: Never  Vaping Use   Vaping status: Never Used  Substance and Sexual Activity   Alcohol use: No   Drug use: No   Sexual activity: Not on file  Other Topics Concern   Not on file  Social History Narrative   Lives at home with son, Independent at baseline   Social Drivers of Health   Financial Resource Strain: Medium Risk (10/31/2021)   Overall Financial Resource Strain (CARDIA)    Difficulty of Paying Living Expenses: Somewhat hard  Food Insecurity: No Food Insecurity (10/31/2021)   Hunger Vital Sign    Worried About Running Out of Food in the Last Year: Never true    Ran Out of Food in the Last Year: Never true  Transportation Needs: No Transportation Needs (10/31/2021)   PRAPARE -  Administrator, Civil Service (Medical): No    Lack of Transportation (Non-Medical): No  Physical Activity: Insufficiently Active (10/31/2021)   Exercise Vital Sign    Days of Exercise per Week: 2 days    Minutes of Exercise per Session: 20 min  Stress: No Stress Concern Present (10/31/2021)   [employer] of Occupational Health - Occupational Stress Questionnaire  Feeling of Stress : Only a little  Social Connections: Moderately Isolated (10/31/2021)   Social Connection and Isolation Panel [NHANES]    Frequency of Communication with Friends and Family: More than three times a week    Frequency of Social Gatherings with Friends and Family: More than three times a week    Attends Religious Services: More than 4 times per year    Active Member of [employer] or Organizations: No    Attends Banker Meetings: Never    Marital Status: Never married  Intimate Partner Violence: Not At Risk (10/31/2021)   Humiliation, Afraid, Rape, and Kick questionnaire    Fear of Current or Ex-Partner: No    Emotionally Abused: No    Physically Abused: No    Sexually Abused: No    FAMILY HISTORY: Family History  Problem Relation Age of Onset   Diabetes Mellitus II Mother    CAD Father    Hypertension Sister    Diabetes Sister    Cancer Sister    Breast cancer Sister 72   Stroke Brother     ALLERGIES:  is allergic to azithromycin.  MEDICATIONS:  Current Outpatient Medications  Medication Sig Dispense Refill   acetaminophen (TYLENOL) 500 MG tablet Take 2 tablets (1,000 mg total) by mouth every 6 (six) hours as needed for mild pain.     amLODipine (NORVASC) 2.5 MG tablet Take 2.5 mg by mouth every morning.     aspirin EC 81 MG tablet Take 81 mg by mouth daily. Swallow whole.     cholecalciferol (VITAMIN D) 25 MCG (1000 UNIT) tablet Take 1,000 Units by mouth daily.     gabapentin (NEURONTIN) 100 MG capsule Take 100 mg by mouth 3 (three) times daily.     ibuprofen (ADVIL)  600 MG tablet Take 1 tablet (600 mg total) by mouth every 8 (eight) hours as needed for moderate pain. 60 tablet 1   lisinopril (ZESTRIL) 40 MG tablet Take 40 mg by mouth every morning.     rosuvastatin (CRESTOR) 20 MG tablet Take 20 mg by mouth at bedtime.     silver sulfADIAZINE (SILVADENE) 1 % cream Apply 1 Application topically daily. 50 g 0   spironolactone (ALDACTONE) 50 MG tablet Take 50 mg by mouth daily.      anastrozole (ARIMIDEX) 1 MG tablet Take 1 tablet (1 mg total) by mouth daily. 90 tablet 1   No current facility-administered medications for this visit.    Review of Systems  Constitutional:  Negative for appetite change, chills, fatigue and fever.  HENT:   Negative for hearing loss and voice change.   Eyes:  Negative for eye problems.  Respiratory:  Negative for chest tightness and cough.   Cardiovascular:  Negative for chest pain.  Gastrointestinal:  Negative for abdominal distention, abdominal pain and blood in stool.  Endocrine: Negative for hot flashes.  Genitourinary:  Negative for difficulty urinating and frequency.   Musculoskeletal:  Negative for arthralgias.  Skin:  Negative for itching and rash.  Neurological:  Negative for extremity weakness.  Hematological:  Negative for adenopathy.  Psychiatric/Behavioral:  Negative for confusion.      PHYSICAL EXAMINATION: ECOG PERFORMANCE STATUS: 0 - Asymptomatic  Vitals:   04/06/23 1017  BP: 138/72  Pulse: 63  Resp: 18  Temp: (!) 97.4 F (36.3 C)  SpO2: 100%   Filed Weights   04/06/23 1017  Weight: 147 lb 1.6 oz (66.7 kg)    Physical Exam Constitutional:  General: She is not in acute distress.    Appearance: She is not diaphoretic.  HENT:     Head: Normocephalic and atraumatic.     Mouth/Throat:     Pharynx: No oropharyngeal exudate.  Eyes:     General: No scleral icterus.    Pupils: Pupils are equal, round, and reactive to light.  Cardiovascular:     Rate and Rhythm: Normal rate and regular  rhythm.     Heart sounds: No murmur heard. Pulmonary:     Effort: Pulmonary effort is normal. No respiratory distress.     Breath sounds: No wheezing.  Abdominal:     General: There is no distension.     Palpations: Abdomen is soft.     Tenderness: There is no abdominal tenderness.  Musculoskeletal:        General: Normal range of motion.     Cervical back: Normal range of motion and neck supple.  Skin:    General: Skin is warm and dry.     Findings: No erythema.  Neurological:     Mental Status: She is alert and oriented to person, place, and time. Mental status is at baseline.     Motor: No abnormal muscle tone.  Psychiatric:        Mood and Affect: Mood and affect normal.    Breast exam was performed in seated and lying down position. Right breast status post lumpectomy with a well-healed mass like surgical scar and indentation close to nipple  Left breast s/p lumpectomy with well healed surgical scar. Left breast 8 o'clock focal skin edema  No palpable axillary lymphadenopathy bilaterally   LABORATORY DATA:  I have reviewed the data as listed    Latest Ref Rng & Units 04/06/2023   10:10 AM 10/06/2022    9:34 AM 04/02/2022    9:43 AM  CBC  WBC 4.0 - 10.5 K/uL 4.8  4.8  4.8   Hemoglobin 12.0 - 15.0 g/dL 16.1  09.6  04.5   Hematocrit 36.0 - 46.0 % 44.9  41.8  42.2   Platelets 150 - 400 K/uL 192  177  185       Latest Ref Rng & Units 04/06/2023   10:10 AM 10/06/2022    9:33 AM 04/02/2022    9:43 AM  CMP  Glucose 70 - 99 mg/dL 82  95  97   BUN 8 - 23 mg/dL 17  18  17    Creatinine 0.44 - 1.00 mg/dL 4.09  8.11  9.14   Sodium 135 - 145 mmol/L 137  137  140   Potassium 3.5 - 5.1 mmol/L 4.6  4.5  4.4   Chloride 98 - 111 mmol/L 105  102  107   CO2 22 - 32 mmol/L 27  28  27    Calcium 8.9 - 10.3 mg/dL 9.4  9.2  9.6   Total Protein 6.5 - 8.1 g/dL 7.4  7.2  7.2   Total Bilirubin 0.0 - 1.2 mg/dL 0.6  0.5  0.6   Alkaline Phos 38 - 126 U/L 58  54  53   AST 15 - 41 U/L 14  13  14     ALT 0 - 44 U/L 14  12  14         RADIOGRAPHIC STUDIES: I have personally reviewed the radiological images as listed and agreed with the findings in the report. No results found.

## 2023-04-06 NOTE — Assessment & Plan Note (Addendum)
 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 Oncotype Dx recurrence score 17 No chemotherapy benefit.  S/p adjuvant RT.  Patient declined genetic counseling. Continue Arimidex  1mg  daily [since Dec 2023] Will obtain annual mammogram for surveillance.  August 2025

## 2023-04-06 NOTE — Assessment & Plan Note (Addendum)
 Recommend patient to take calcium 1200mg  and vitamin D supplementation. 03/21/2021 DEXA at [facility] showed osteopenia.  Plan to repeat DEXA in 2 years.

## 2023-04-06 NOTE — Assessment & Plan Note (Signed)
Remote right breast history of cancer status post surgery.-2007 Patient took endocrine therapy for 5 years.  Details unknown. 

## 2023-07-22 ENCOUNTER — Other Ambulatory Visit: Payer: Self-pay

## 2023-07-22 DIAGNOSIS — Z170 Estrogen receptor positive status [ER+]: Secondary | ICD-10-CM

## 2023-09-01 ENCOUNTER — Ambulatory Visit
Admission: RE | Admit: 2023-09-01 | Discharge: 2023-09-01 | Disposition: A | Discharge status: Home / Self Care | Source: Ambulatory Visit | Attending: Surgery | Admitting: Surgery

## 2023-09-01 DIAGNOSIS — C50412 Malignant neoplasm of upper-outer quadrant of left female breast: Secondary | ICD-10-CM | POA: Insufficient documentation

## 2023-09-01 DIAGNOSIS — Z170 Estrogen receptor positive status [ER+]: Secondary | ICD-10-CM

## 2023-09-08 ENCOUNTER — Ambulatory Visit (INDEPENDENT_AMBULATORY_CARE_PROVIDER_SITE_OTHER): Admitting: Surgery

## 2023-09-08 ENCOUNTER — Encounter: Payer: Self-pay | Admitting: Surgery

## 2023-09-08 VITALS — BP 150/68 | HR 77 | Temp 98.4°F | Ht 62.0 in | Wt 142.6 lb

## 2023-09-08 DIAGNOSIS — C50412 Malignant neoplasm of upper-outer quadrant of left female breast: Secondary | ICD-10-CM

## 2023-09-08 DIAGNOSIS — R634 Abnormal weight loss: Secondary | ICD-10-CM | POA: Diagnosis not present

## 2023-09-08 DIAGNOSIS — Z170 Estrogen receptor positive status [ER+]: Secondary | ICD-10-CM | POA: Diagnosis not present

## 2023-09-08 DIAGNOSIS — N611 Abscess of the breast and nipple: Secondary | ICD-10-CM

## 2023-09-08 NOTE — Progress Notes (Signed)
 09/08/2023  History of Present Illness: STEFFANY SCHOENFELDER is a 68 y.o. female status post left breast lumpectomy with sentinel lymph node biopsy on 10/10/2021.  She also has a history of right breast abscess and had prior abscess cavity excision on 01/28/2021.  Patient presents today for follow-up.  She did not need chemotherapy with the Oncotype score of 17, has completed radiation and is currently on Arimidex .  She reports that she is tolerating this well.  She had her last mammogram on 09/01/2023 which showed stable postsurgical changes in both breasts without any suspicious findings.  Patient reports that she has been doing well and denies any new symptoms.  Particularly denies any new masses, breast pain, nipple changes.  However she does report that she has been losing weight and has not been hungry recently eating about 1 meal a day.  She had laboratory workup with her PCP which did not show any abnormalities.  Past Medical History: Past Medical History:  Diagnosis Date   Borderline diabetes    Breast cancer (HCC) 2007   RT LUMPECTOMY, ER +   Lab test positive for detection of COVID-19 virus 12/2018   MRSA (methicillin resistant staph aureus) culture positive 06/2015   lower abdomen/mons pubis abscess   Personal history of malignant neoplasm of breast    invasive ductal carcinoma with tubular features,Stage 1,ER pos,HER2 neg   Personal history of radiation therapy 2007   Personal history of tobacco use, presenting hazards to health    Pneumonia 02/2015   Radiation 2007   BREAST CA   Unspecified essential hypertension      Past Surgical History: Past Surgical History:  Procedure Laterality Date   ABDOMINAL HYSTERECTOMY  1995   AXILLARY SENTINEL NODE BIOPSY Right 2007   BREAST BIOPSY Right 2007   CORE - POS   BREAST BIOPSY Right 1995   EXCISIONAL - NEG   BREAST BIOPSY Left 09/17/2021   u/s bx, heart clip. 2:00, path pending.   BREAST LUMPECTOMY Right 2007   breast ca   BREAST  LUMPECTOMY Left 10/10/2021   breast cancer   BREAST LUMPECTOMY,RADIO FREQ LOCALIZER,AXILLARY SENTINEL LYMPH NODE BIOPSY Left 10/10/2021   Procedure: BREAST LUMPECTOMY,RADIO FREQ LOCALIZER,AXILLARY SENTINEL LYMPH NODE BIOPSY;  Surgeon: Desiderio Schanz, MD;  Location: [facility] ORS;  Service: General;  Laterality: Left;   BREAST SURGERY  2007   right lumpectomy   COLONOSCOPY  12/27/2012   COLONOSCOPY WITH PROPOFOL  N/A 02/14/2019   Procedure: COLONOSCOPY WITH PROPOFOL ;  Surgeon: Therisa Bi, MD;  Location: [facility] ENDOSCOPY;  Service: Gastroenterology;  Laterality: N/A;   EXCISION OF BREAST BIOPSY Right 01/28/2021   Procedure: EXCISION OF BREAST abscess;  Surgeon: Desiderio Schanz, MD;  Location: [facility] ORS;  Service: General;  Laterality: Right;    Home Medications: Prior to Admission medications   Medication Sig Start Date End Date Taking? Authorizing Provider  acetaminophen  (TYLENOL ) 500 MG tablet Take 2 tablets (1,000 mg total) by mouth every 6 (six) hours as needed for mild pain. 10/10/21  Yes Dalya Maselli, Schanz, MD  amLODipine  (NORVASC ) 2.5 MG tablet Take 2.5 mg by mouth every morning.   Yes [provider]  anastrozole  (ARIMIDEX ) 1 MG tablet Take 1 tablet (1 mg total) by mouth daily. 04/06/23  Yes Babara Call, MD  aspirin  EC 81 MG tablet Take 81 mg by mouth daily. Swallow whole.   Yes [provider]  cholecalciferol (VITAMIN D) 25 MCG (1000 UNIT) tablet Take 1,000 Units by mouth daily. 11/14/20  Yes [provider]  gabapentin  (NEURONTIN ) 100 MG capsule Take 100 mg by mouth 3 (three) times daily. 11/28/21  Yes [provider]  ibuprofen  (ADVIL ) 600 MG tablet Take 1 tablet (600 mg total) by mouth every 8 (eight) hours as needed for moderate pain. 10/10/21  Yes Renarda Mullinix, Aloysius, MD  lisinopril  (ZESTRIL ) 40 MG tablet Take 40 mg by mouth every morning. 01/23/21  Yes [provider]  rosuvastatin (CRESTOR) 20 MG tablet Take 20 mg by mouth at bedtime. 10/13/21  Yes [provider]  silver  sulfADIAZINE  (SILVADENE ) 1 % cream Apply 1 Application topically daily. 12/16/21  Yes Chrystal, Marcey, MD  spironolactone (ALDACTONE) 50 MG tablet Take 50 mg by mouth daily.  06/17/17  Yes [provider]  albuterol  (PROVENTIL ) (2.5 MG/3ML) 0.083% nebulizer solution Take 3 mLs (2.5 mg total) by nebulization every 6 (six) hours as needed for wheezing or shortness of breath. 01/19/17 12/18/18  Alona Knee, PA-C    Allergies: Allergies  Allergen Reactions   Azithromycin Swelling    Review of Systems: Review of Systems  Constitutional:  Negative for chills and fever.  Respiratory:  Negative for shortness of breath.   Cardiovascular:  Negative for chest pain.  Gastrointestinal:  Negative for nausea and vomiting.  Skin:  Negative for rash.    Physical Exam BP (!) 150/68   Pulse 77   Temp 98.4 F (36.9 C) (Oral)   Ht 5' 2 (1.575 m)   Wt 142 lb 9.6 oz (64.7 kg)   SpO2 99%   BMI 26.08 kg/m  CONSTITUTIONAL: No acute distress HEENT:  Normocephalic, atraumatic, extraocular motion intact. RESPIRATORY:  Normal respiratory effort without pathologic use of accessory muscles. CARDIOVASCULAR: Regular rhythm and rate BREAST: Left breast status post lumpectomy in the lateral aspect of the breast with incision well-healed.  No palpable masses, other skin changes, or nipple changes.  Left axillary incision is well-healed without any palpable lymphadenopathy.  Right breast status post prior abscess excision in the upper lateral periareolar area with wound well-healed.  No palpable masses aside from prior known dystrophic calcifications near the area of the abscess, other skin changes, or nipple changes.  No right axillary lymphadenopathy.   NEUROLOGIC:  Motor and sensation is grossly normal.  Cranial nerves are grossly intact. PSYCH:  Alert and oriented to person, place and time. Affect is normal.  Labs/Imaging: Mammogram on 09/01/2023: FINDINGS: There is density  and architectural distortion within the LEFT breast, corresponding to the site of prior lumpectomy. Spot compression magnification view(s) demonstrate no evidence of recurrent malignancy. Findings are stable compared to prior.   There is stable density and architectural distortion within the RIGHT breast, corresponding to the site of remote prior surgery. Questioned asymmetry anterior to the lumpectomy site is unchanged from priors and contains fat on spot compression views, compatible with benign fat necrosis at the site of excised calcium deposit. It immediately underlies a focal indentation/scar in the RIGHT breast which has been present since the time of surgery. There is no mammographic evidence of malignancy in the RIGHT breast.   IMPRESSION: No mammographic evidence of malignancy in EITHER breast.   RECOMMENDATION: Diagnostic mammogram of the BILATERAL breasts in 1 year.   I have discussed the findings and recommendations with the patient. If applicable, a reminder letter will be sent to the patient regarding the next appointment.   BI-RADS CATEGORY  2: Benign.  Assessment and Plan: This is a 68 y.o. female status post left breast lumpectomy and sentinel lymph node biopsy.  -  Patient's mammogram does not show any suspicious findings and exam today is also reassuring.  The patient does not have any new breast complaints. - The patient has reported some weight loss and decreased appetite.  Although her exam today is reassuring, discussed with patient that if she continues to be losing weight to check back with her PCP for further testing or evaluation. - Otherwise follow-up with me in 1 year with repeat mammogram.  I spent 20 minutes dedicated to the care of this patient on the date of this encounter to include pre-visit review of records, face-to-face time with the patient discussing diagnosis and management, and any post-visit coordination of care.   Aloysius Sheree Plant, MD [facility]

## 2023-09-08 NOTE — Patient Instructions (Signed)
 How to Do a Breast Self-Exam Doing breast self-exams can help you stay healthy. They're one way to know what's normal for your breasts. They can help you catch a problem while it's still small and can be treated. You need to: Check your breasts often. Tell your doctor about any changes. You should do breast self-exams even if you have breast implants. What you need: A mirror. A well-lit room. A pillow or other soft object. How to do a breast self-exam Look for changes  Take off all the clothes above your waist. Stand in front of a mirror in a room with good lighting. Put your hands down at your sides. Compare your breasts in the mirror. Look for difference between them, such as: Differences in shape. Differences in size. Wrinkles, dips, and bumps in one breast and not the other. Look at each breast for skin changes, such as: Redness. Scaly spots. Spots where your skin is thicker. Dimpling. Open sores. Look for changes in your nipples, such as: Fluid coming out of a nipple. Fluid around a nipple. Bleeding. Dimpling. Redness. A nipple that looks pushed in or that has changed position. Feel for changes Lie on your back. Feel each breast. To do this: Pick a breast to feel. Place a pillow under the shoulder closest to that breast. Put the arm closest to that breast behind your head. Feel the breast using the hand of your other arm. Use the pads of your three middle fingers to make small circles starting near the nipple. Use light, medium, and firm pressure. Keep making circles, moving down over the breast. Stop when you feel your ribs. Start making circles with your fingers again, this time going up until you reach your collarbone. Then, make circles out across your breast and into your armpit area. Squeeze your nipple. Check for fluid and lumps. Do these steps again to check your other breast. Sit or stand in the tub or shower. With soapy water on your skin, feel each breast  the same way you did when you were lying down. Write down what you find Writing down what you find can help you keep track of what you want to tell your doctor. Write down: What's normal for each breast. Any changes you find. Write down: The kind of change. If your breast feels tender or painful. Any lump you find. Write down its size and where it is. When you last had your period. General tips If you're breastfeeding, the best time to check your breasts is after you feed your baby or after you use a breast pump. If you get a period, the best time to check your breasts is 5-7 days after your period ends. With time, you'll get more used to doing the self-exam. You'll also start to know if there are changes in your breasts. Contact a doctor if: You see a change in the shape or size of your breasts or nipples. You see a change in the skin of your breast or nipples. You have fluid coming from your nipples that isn't normal. You find a new lump or thick area. You have breast pain. You have any concerns about your breast health. This information is not intended to replace advice given to you by your health care provider. Make sure you discuss any questions you have with your health care provider. Document Revised: 03/03/2023 Document Reviewed: 03/03/2023 Elsevier Patient Education  2025 [employer].

## 2023-10-06 ENCOUNTER — Inpatient Hospital Stay: Admitting: Oncology

## 2023-10-06 ENCOUNTER — Encounter: Payer: Self-pay | Admitting: Oncology

## 2023-10-06 ENCOUNTER — Inpatient Hospital Stay: Attending: Oncology

## 2023-10-06 VITALS — BP 131/75 | HR 60 | Temp 97.9°F | Resp 16 | Wt 144.0 lb

## 2023-10-06 DIAGNOSIS — C50312 Malignant neoplasm of lower-inner quadrant of left female breast: Secondary | ICD-10-CM | POA: Diagnosis not present

## 2023-10-06 DIAGNOSIS — M8580 Other specified disorders of bone density and structure, unspecified site: Secondary | ICD-10-CM | POA: Insufficient documentation

## 2023-10-06 DIAGNOSIS — C50412 Malignant neoplasm of upper-outer quadrant of left female breast: Secondary | ICD-10-CM | POA: Insufficient documentation

## 2023-10-06 DIAGNOSIS — C50811 Malignant neoplasm of overlapping sites of right female breast: Secondary | ICD-10-CM

## 2023-10-06 DIAGNOSIS — Z79811 Long term (current) use of aromatase inhibitors: Secondary | ICD-10-CM | POA: Insufficient documentation

## 2023-10-06 DIAGNOSIS — Z170 Estrogen receptor positive status [ER+]: Secondary | ICD-10-CM | POA: Insufficient documentation

## 2023-10-06 DIAGNOSIS — Z853 Personal history of malignant neoplasm of breast: Secondary | ICD-10-CM

## 2023-10-06 DIAGNOSIS — C50912 Malignant neoplasm of unspecified site of left female breast: Secondary | ICD-10-CM | POA: Diagnosis present

## 2023-10-06 LAB — CBC WITH DIFFERENTIAL (CANCER CENTER ONLY)
Abs Immature Granulocytes: 0.02 K/uL (ref 0.00–0.07)
Basophils Absolute: 0 K/uL (ref 0.0–0.1)
Basophils Relative: 1 %
Eosinophils Absolute: 0.1 K/uL (ref 0.0–0.5)
Eosinophils Relative: 1 %
HCT: 42.3 % (ref 36.0–46.0)
Hemoglobin: 14.6 g/dL (ref 12.0–15.0)
Immature Granulocytes: 0 %
Lymphocytes Relative: 41 %
Lymphs Abs: 2.3 K/uL (ref 0.7–4.0)
MCH: 31.8 pg (ref 26.0–34.0)
MCHC: 34.5 g/dL (ref 30.0–36.0)
MCV: 92.2 fL (ref 80.0–100.0)
Monocytes Absolute: 0.4 K/uL (ref 0.1–1.0)
Monocytes Relative: 8 %
Neutro Abs: 2.7 K/uL (ref 1.7–7.7)
Neutrophils Relative %: 49 %
Platelet Count: 178 K/uL (ref 150–400)
RBC: 4.59 MIL/uL (ref 3.87–5.11)
RDW: 11.8 % (ref 11.5–15.5)
WBC Count: 5.5 K/uL (ref 4.0–10.5)
nRBC: 0 % (ref 0.0–0.2)

## 2023-10-06 LAB — CMP (CANCER CENTER ONLY)
ALT: 10 U/L (ref 0–44)
AST: 12 U/L — ABNORMAL LOW (ref 15–41)
Albumin: 3.8 g/dL (ref 3.5–5.0)
Alkaline Phosphatase: 67 U/L (ref 38–126)
Anion gap: 4 — ABNORMAL LOW (ref 5–15)
BUN: 16 mg/dL (ref 8–23)
CO2: 26 mmol/L (ref 22–32)
Calcium: 9.2 mg/dL (ref 8.9–10.3)
Chloride: 107 mmol/L (ref 98–111)
Creatinine: 1.08 mg/dL — ABNORMAL HIGH (ref 0.44–1.00)
GFR, Estimated: 56 mL/min — ABNORMAL LOW (ref >60)
Glucose, Bld: 98 mg/dL (ref 70–99)
Potassium: 4.3 mmol/L (ref 3.5–5.1)
Sodium: 137 mmol/L (ref 135–145)
Total Bilirubin: 0.8 mg/dL (ref 0.0–1.2)
Total Protein: 6.8 g/dL (ref 6.5–8.1)

## 2023-10-06 MED ORDER — ANASTROZOLE 1 MG PO TABS: 1.0000 mg | ORAL_TABLET | Freq: Every day | ORAL | 1 refills | Status: AC

## 2023-10-06 NOTE — Assessment & Plan Note (Addendum)
 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 Oncotype Dx recurrence score 17 No chemotherapy benefit.  S/p adjuvant RT.  Patient declined genetic counseling. Continue Arimidex   1mg  daily [since Dec 2023] annual mammogram for surveillance.  August 2025 mammogram was reviewed.

## 2023-10-06 NOTE — Assessment & Plan Note (Signed)
Remote right breast history of cancer status post surgery.-2007 Patient took endocrine therapy for 5 years.  Details unknown. 

## 2023-10-06 NOTE — Progress Notes (Signed)
 Hematology/Oncology Progress note [phone] [phone]       CHIEF COMPLAINTS/PURPOSE OF CONSULTATION:  Stage 1A Left breast cancer [2023], history of stage 1 right breast cancer [2007]  ASSESSMENT & PLAN:   Cancer Staging  Breast cancer [facility]) Staging form: Breast, AJCC 8th Edition - Clinical stage from 09/17/2021: Stage IA (cT1b, cN0, cM0, G1, ER+, PR+, HER2-) - Signed by Babara Call, MD on 09/26/2021 - Pathologic stage from 10/10/2021: Stage IA (pT1b, pN0, cM0, G2, ER+, PR+, HER2-, Oncotype DX score: 17) - Signed by Babara Call, MD on 11/06/2021   Breast cancer (HCC) 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 Oncotype Dx recurrence score 17 No chemotherapy benefit.  S/p adjuvant RT.  Patient declined genetic counseling. Continue Arimidex   1mg  daily [since Dec 2023] annual mammogram for surveillance.  August 2025 mammogram was reviewed.     History of breast cancer Remote right breast history of cancer status post surgery.-2007 Patient took endocrine therapy for 5 years.  Details unknown.  Osteopenia Recommend patient to take calcium 1200mg  and vitamin D supplementation. 03/21/2021 DEXA at [facility] showed osteopenia.  Plan to repeat DEXA   Orders Placed This Encounter  Procedures   DG Bone Density    Standing Status:   Future    Expected Date:   12/22/2023    Expiration Date:   10/05/2024    Reason for Exam (SYMPTOM  OR DIAGNOSIS REQUIRED):   breast cancer    Preferred imaging location?:   [facility]   CMP (Cancer Center only)    Standing Status:   Future    Expected Date:   04/05/2024    Expiration Date:   07/04/2024   CBC with Differential (Cancer Center Only)    Standing Status:   Future    Expected Date:   04/05/2024    Expiration Date:   07/04/2024    Follow up  6 months.  All questions were answered. The patient knows to call the clinic with any problems, questions or concerns.  Call Babara, MD, PhD [facility] Hematology  Oncology 10/06/2023     HISTORY OF PRESENTING ILLNESS:  Adriana Berg 68 y.o. female presents for follow up of Stage 1A Left breast cancer [2023], history of stage 1 right breast cancer [2007] I have reviewed her chart and materials related to her cancer extensively and collaborated history with the patient. Summary of oncologic history is as follows: Oncology History  Breast cancer (HCC)  08/04/2021 Imaging   Bilateral screening mammogram In the right breast, a possible asymmetry warrants further evaluation. Interval resection of RIGHT breast dystrophic calcifications.   In the left breast, possible architectural distortion warrants further evaluation.     08/28/2021 Mammogram   Bilateral diagnostic mammogram showed 1. There is a suspicious 6 mm mass in the LEFT breast at 2 o'clock. Recommend ultrasound-guided biopsy for definitive characterization. Recommend attention on post marker placement mammogram to assess for mammographic/sonographic correlation. 2. No suspicious LEFT axillary adenopathy. 3. No mammographic evidence of malignancy in the RIGHT breast.     09/17/2021 Cancer Staging   Staging form: Breast, AJCC 8th Edition - Clinical stage from 09/17/2021: Stage IA (cT1b, cN0, cM0, G1, ER+, PR+, HER2-) - Signed by Babara Call, MD on 09/26/2021 Stage prefix: Initial diagnosis Histologic grading system: 3 grade system   09/26/2021 Initial Diagnosis   Breast cancer (HCC)  09/17/2021, left breast mass core biopsy showed invasive mammary carcinoma, no special type.  Grade 1.  DCIS present,  LVI not identified, ER+ 90%, PR+ 90%, HER2 negative[score 1+]    10/10/2021 Surgery   Patient underwent left breast lumpectomy and sentinel lymph node biopsy  Pathology showed pT1b pN0 A. breast, left; lumpectomy:  - 8 mm invasive carcinoma of no special type, margins negative (see  below)  B. Sentinel node #1; excision: - No evidence of malignancy in 1 sentinel node.  C.  Sentinel node  #2;  excision: - No evidence of malignancy in 2 sentinel nodes.  D.  Sentinel node  #3; excision: - No evidence of malignancy in 2 sentinel nodes.   TUMOR Histologic Type: Invasive carcinoma of no special type Histologic Grade (Nottingham Histologic Score)      Glandular (Acinar)/Tubular Differentiation: 2 (of 3)      Nuclear Pleomorphism: 3 (of 3)      Mitotic Rate: 1 (of 3)      Overall Grade: 2 (of 3) Tumor Size: 8 mm Tumor Focality: Unifocal Ductal Carcinoma In Situ (DCIS): Present      Negative for extensive intraductal component Tumor Extent: Not applicable Lymphatic and/or Vascular Invasion: Not identified Treatment Effect in the Breast: No known presurgical therapy  MARGINS Margin Status for Invasive Carcinoma: All margins negative for invasive carcinoma.      Distance from closest margin: 2.5 mm      Specify closest margin: Anterior  Margin Status for DCIS: All margins negative for DCIS.      Distance from DCIS to closest margin: 2.8 mm      Specify closest margin: Anterior  REGIONAL LYMPH NODES Regional Lymph Node Status: All regional lymph nodes negative for tumor      Total Number of Lymph Nodes Examined (sentinel and non-sentinel): 5       Number of Sentinel Nodes Examined: 5  DISTANT METASTASIS Distant Site(s) Involved, if applicable: Not applicable    10/10/2021 Oncotype testing   Oncotype DX recurrence score 17, distant recurrence risk at 9 years with AI or tamoxifen alone- 5%, absolute chemotherapy benefit < 1%   10/10/2021 Cancer Staging   Staging form: Breast, AJCC 8th Edition - Pathologic stage from 10/10/2021: Stage IA (pT1b, pN0, cM0, G2, ER+, PR+, HER2-, Oncotype DX score: 17) - Signed by Babara Call, MD on 11/06/2021 Stage prefix: Initial diagnosis Multigene prognostic tests performed: Oncotype DX Recurrence score range: Greater than or equal to 11 Histologic grading system: 3 grade system   11/25/2021 - 12/30/2021 Radiation Therapy   Adjuvant Radiation to  breast   08/31/2022 Mammogram   Bilateral diagnostic mammogram showed Stable bilateral lumpectomy sites. No evidence of malignancy in the bilateral breasts.   01/02/2023 -  Anti-estrogen oral therapy   Started on Arimidex  1 mg daily.    Remote stage I right breast cancer in 2007, status post resection.  Patient recalls taking adjuvant endocrine therapy for 5 years.  Details unknown.  Previous pathology report is not available in EMR.  Status post radiation of her breast.  She reports feeling well and tolerates radiation.  No new complaints today.  INTERVAL HISTORY Adriana Berg is a 67 y.o. female who has above history reviewed by me today presents for follow up visit for  Stage 1A Left breast cancer [2023], history of stage 1 right breast cancer [2007] She tolerates Arimidex  1 mg daily.  She denies hot flash or joint pain Patient has no new breast concerns.   MEDICAL HISTORY:  Past Medical History:  Diagnosis Date   Borderline diabetes    Breast cancer (HCC)  2007   RT LUMPECTOMY, ER +   Lab test positive for detection of COVID-19 virus 12/2018   MRSA (methicillin resistant staph aureus) culture positive 06/2015   lower abdomen/mons pubis abscess   Personal history of malignant neoplasm of breast    invasive ductal carcinoma with tubular features,Stage 1,ER pos,HER2 neg   Personal history of radiation therapy 2007   Personal history of tobacco use, presenting hazards to health    Pneumonia 02/2015   Radiation 2007   BREAST CA   Unspecified essential hypertension     SURGICAL HISTORY: Past Surgical History:  Procedure Laterality Date   ABDOMINAL HYSTERECTOMY  1995   AXILLARY SENTINEL NODE BIOPSY Right 2007   BREAST BIOPSY Right 2007   CORE - POS   BREAST BIOPSY Right 1995   EXCISIONAL - NEG   BREAST BIOPSY Left 09/17/2021   u/s bx, heart clip. 2:00, path pending.   BREAST LUMPECTOMY Right 2007   breast ca   BREAST LUMPECTOMY Left 10/10/2021   breast cancer   BREAST  LUMPECTOMY,RADIO FREQ LOCALIZER,AXILLARY SENTINEL LYMPH NODE BIOPSY Left 10/10/2021   Procedure: BREAST LUMPECTOMY,RADIO FREQ LOCALIZER,AXILLARY SENTINEL LYMPH NODE BIOPSY;  Surgeon: Desiderio Schanz, MD;  Location: [facility] ORS;  Service: General;  Laterality: Left;   BREAST SURGERY  2007   right lumpectomy   COLONOSCOPY  12/27/2012   COLONOSCOPY WITH PROPOFOL  N/A 02/14/2019   Procedure: COLONOSCOPY WITH PROPOFOL ;  Surgeon: Therisa Bi, MD;  Location: [facility] ENDOSCOPY;  Service: Gastroenterology;  Laterality: N/A;   EXCISION OF BREAST BIOPSY Right 01/28/2021   Procedure: EXCISION OF BREAST abscess;  Surgeon: Desiderio Schanz, MD;  Location: [facility] ORS;  Service: General;  Laterality: Right;    SOCIAL HISTORY: Social History   Socioeconomic History   Marital status: Single    Spouse name: Not on file   Number of children: Not on file   Years of education: Not on file   Highest education level: Not on file  Occupational History   Not on file  Tobacco Use   Smoking status: Some Days    Current packs/day: 0.25    Average packs/day: 0.3 packs/day for 41.0 years (10.3 ttl pk-yrs)    Types: Cigarettes    Passive exposure: Past   Smokeless tobacco: Never  Vaping Use   Vaping status: Never Used  Substance and Sexual Activity   Alcohol use: No   Drug use: No   Sexual activity: Not on file  Other Topics Concern   Not on file  Social History Narrative   Lives at home with son, Independent at baseline   Social Drivers of Health   Financial Resource Strain: Medium Risk (10/31/2021)   Overall Financial Resource Strain (CARDIA)    Difficulty of Paying Living Expenses: Somewhat hard  Food Insecurity: No Food Insecurity (10/31/2021)   Hunger Vital Sign    Worried About Running Out of Food in the Last Year: Never true    Ran Out of Food in the Last Year: Never true  Transportation Needs: No Transportation Needs (10/31/2021)   PRAPARE - Administrator, Civil Service (Medical): No     Lack of Transportation (Non-Medical): No  Physical Activity: Insufficiently Active (10/31/2021)   Exercise Vital Sign    Days of Exercise per Week: 2 days    Minutes of Exercise per Session: 20 min  Stress: No Stress Concern Present (10/31/2021)   [employer] of Occupational Health - Occupational Stress Questionnaire    Feeling of Stress : Only a  little  Social Connections: Moderately Isolated (10/31/2021)   Social Connection and Isolation Panel    Frequency of Communication with Friends and Family: More than three times a week    Frequency of Social Gatherings with Friends and Family: More than three times a week    Attends Religious Services: More than 4 times per year    Active Member of [employer] or Organizations: No    Attends Banker Meetings: Never    Marital Status: Never married  Intimate Partner Violence: Not At Risk (10/31/2021)   Humiliation, Afraid, Rape, and Kick questionnaire    Fear of Current or Ex-Partner: No    Emotionally Abused: No    Physically Abused: No    Sexually Abused: No    FAMILY HISTORY: Family History  Problem Relation Age of Onset   Diabetes Mellitus II Mother    CAD Father    Hypertension Sister    Diabetes Sister    Cancer Sister    Breast cancer Sister 85   Stroke Brother     ALLERGIES:  is allergic to azithromycin.  MEDICATIONS:  Current Outpatient Medications  Medication Sig Dispense Refill   acetaminophen  (TYLENOL ) 500 MG tablet Take 2 tablets (1,000 mg total) by mouth every 6 (six) hours as needed for mild pain.     amLODipine  (NORVASC ) 2.5 MG tablet Take 2.5 mg by mouth every morning.     aspirin  EC 81 MG tablet Take 81 mg by mouth daily. Swallow whole.     cholecalciferol (VITAMIN D) 25 MCG (1000 UNIT) tablet Take 1,000 Units by mouth daily.     gabapentin  (NEURONTIN ) 100 MG capsule Take 100 mg by mouth 3 (three) times daily.     ibuprofen  (ADVIL ) 600 MG tablet Take 1 tablet (600 mg total) by mouth every 8 (eight)  hours as needed for moderate pain. 60 tablet 1   lisinopril  (ZESTRIL ) 40 MG tablet Take 40 mg by mouth every morning.     rosuvastatin (CRESTOR) 20 MG tablet Take 20 mg by mouth at bedtime.     silver  sulfADIAZINE  (SILVADENE ) 1 % cream Apply 1 Application topically daily. 50 g 0   spironolactone (ALDACTONE) 50 MG tablet Take 50 mg by mouth daily.      anastrozole  (ARIMIDEX ) 1 MG tablet Take 1 tablet (1 mg total) by mouth daily. 90 tablet 1   No current facility-administered medications for this visit.    Review of Systems  Constitutional:  Negative for appetite change, chills, fatigue and fever.  HENT:   Negative for hearing loss and voice change.   Eyes:  Negative for eye problems.  Respiratory:  Negative for chest tightness and cough.   Cardiovascular:  Negative for chest pain.  Gastrointestinal:  Negative for abdominal distention, abdominal pain and blood in stool.  Endocrine: Negative for hot flashes.  Genitourinary:  Negative for difficulty urinating and frequency.   Musculoskeletal:  Negative for arthralgias.  Skin:  Negative for itching and rash.  Neurological:  Negative for extremity weakness.  Hematological:  Negative for adenopathy.  Psychiatric/Behavioral:  Negative for confusion.      PHYSICAL EXAMINATION: ECOG PERFORMANCE STATUS: 0 - Asymptomatic  Vitals:   10/06/23 1035  BP: 131/75  Pulse: 60  Resp: 16  Temp: 97.9 F (36.6 C)  SpO2: 100%   Filed Weights   10/06/23 1035  Weight: 144 lb (65.3 kg)    Physical Exam Constitutional:      General: She is not in acute distress.  Appearance: She is not diaphoretic.  HENT:     Head: Normocephalic and atraumatic.     Mouth/Throat:     Pharynx: No oropharyngeal exudate.  Eyes:     General: No scleral icterus.    Pupils: Pupils are equal, round, and reactive to light.  Cardiovascular:     Rate and Rhythm: Normal rate and regular rhythm.     Heart sounds: No murmur heard. Pulmonary:     Effort: Pulmonary  effort is normal. No respiratory distress.     Breath sounds: No wheezing.  Abdominal:     General: There is no distension.     Palpations: Abdomen is soft.     Tenderness: There is no abdominal tenderness.  Musculoskeletal:        General: Normal range of motion.     Cervical back: Normal range of motion and neck supple.  Skin:    General: Skin is warm and dry.     Findings: No erythema.  Neurological:     Mental Status: She is alert and oriented to person, place, and time. Mental status is at baseline.     Motor: No abnormal muscle tone.  Psychiatric:        Mood and Affect: Mood and affect normal.    Breast exam was performed in seated and lying down position. Right breast status post lumpectomy with a well-healed mass like surgical scar and indentation close to nipple  Left breast s/p lumpectomy with well healed surgical scar. Left breast 8 o'clock focal skin edema  No palpable axillary lymphadenopathy bilaterally   LABORATORY DATA:  I have reviewed the data as listed    Latest Ref Rng & Units 10/06/2023   10:25 AM 04/06/2023   10:10 AM 10/06/2022    9:34 AM  CBC  WBC 4.0 - 10.5 K/uL 5.5  4.8  4.8   Hemoglobin 12.0 - 15.0 g/dL 85.3  85.1  85.9   Hematocrit 36.0 - 46.0 % 42.3  44.9  41.8   Platelets 150 - 400 K/uL 178  192  177       Latest Ref Rng & Units 10/06/2023   10:25 AM 04/06/2023   10:10 AM 10/06/2022    9:33 AM  CMP  Glucose 70 - 99 mg/dL 98  82  95   BUN 8 - 23 mg/dL 16  17  18    Creatinine 0.44 - 1.00 mg/dL 8.91  8.87  9.05   Sodium 135 - 145 mmol/L 137  137  137   Potassium 3.5 - 5.1 mmol/L 4.3  4.6  4.5   Chloride 98 - 111 mmol/L 107  105  102   CO2 22 - 32 mmol/L 26  27  28    Calcium 8.9 - 10.3 mg/dL 9.2  9.4  9.2   Total Protein 6.5 - 8.1 g/dL 6.8  7.4  7.2   Total Bilirubin 0.0 - 1.2 mg/dL 0.8  0.6  0.5   Alkaline Phos 38 - 126 U/L 67  58  54   AST 15 - 41 U/L 12  14  13    ALT 0 - 44 U/L 10  14  12         RADIOGRAPHIC STUDIES: I have  personally reviewed the radiological images as listed and agreed with the findings in the report. MM 3D DIAGNOSTIC MAMMOGRAM BILATERAL BREAST Result Date: 09/01/2023 CLINICAL DATA:  History of LEFT breast cancer status post LEFT breast lumpectomy in October 2023. Has a history of remote RIGHT  breast lumpectomy in 2006. Status post LEFT breast radiation. Currently taking anastrozole . Negative margins. EXAM: DIGITAL DIAGNOSTIC BILATERAL MAMMOGRAM WITH TOMOSYNTHESIS AND CAD TECHNIQUE: Bilateral digital diagnostic mammography and breast tomosynthesis was performed. The images were evaluated with computer-aided detection. COMPARISON:  Previous exam(s). ACR Breast Density Category b: There are scattered areas of fibroglandular density. FINDINGS: There is density and architectural distortion within the LEFT breast, corresponding to the site of prior lumpectomy. Spot compression magnification view(s) demonstrate no evidence of recurrent malignancy. Findings are stable compared to prior. There is stable density and architectural distortion within the RIGHT breast, corresponding to the site of remote prior surgery. Questioned asymmetry anterior to the lumpectomy site is unchanged from priors and contains fat on spot compression views, compatible with benign fat necrosis at the site of excised calcium deposit. It immediately underlies a focal indentation/scar in the RIGHT breast which has been present since the time of surgery. There is no mammographic evidence of malignancy in the RIGHT breast. IMPRESSION: No mammographic evidence of malignancy in EITHER breast. RECOMMENDATION: Diagnostic mammogram of the BILATERAL breasts in 1 year. I have discussed the findings and recommendations with the patient. If applicable, a reminder letter will be sent to the patient regarding the next appointment. BI-RADS CATEGORY  2: Benign. Electronically Signed   By: Norleen Croak M.D.   On: 09/01/2023 12:01

## 2023-10-06 NOTE — Assessment & Plan Note (Signed)
 Recommend patient to take calcium 1200mg  and vitamin D supplementation. 03/21/2021 DEXA at [facility] showed osteopenia.  Plan to repeat DEXA

## 2023-11-26 ENCOUNTER — Telehealth: Payer: Self-pay | Admitting: Oncology

## 2023-11-26 NOTE — Telephone Encounter (Signed)
 I left a vm for pt to call [facility] to schedule her DEXA. [facility] phone number provided [phone]

## 2023-12-20 ENCOUNTER — Other Ambulatory Visit

## 2024-01-05 ENCOUNTER — Other Ambulatory Visit: Payer: Self-pay | Admitting: Acute Care

## 2024-01-05 ENCOUNTER — Ambulatory Visit
Admission: RE | Admit: 2024-01-05 | Discharge: 2024-01-05 | Disposition: A | Discharge status: Home / Self Care | Source: Ambulatory Visit | Attending: Oncology | Admitting: Oncology

## 2024-01-05 DIAGNOSIS — F17210 Nicotine dependence, cigarettes, uncomplicated: Secondary | ICD-10-CM

## 2024-01-05 DIAGNOSIS — M8589 Other specified disorders of bone density and structure, multiple sites: Secondary | ICD-10-CM | POA: Diagnosis not present

## 2024-01-05 DIAGNOSIS — Z87891 Personal history of nicotine dependence: Secondary | ICD-10-CM

## 2024-01-05 DIAGNOSIS — C50811 Malignant neoplasm of overlapping sites of right female breast: Secondary | ICD-10-CM | POA: Diagnosis present

## 2024-01-05 DIAGNOSIS — Z122 Encounter for screening for malignant neoplasm of respiratory organs: Secondary | ICD-10-CM

## 2024-01-19 ENCOUNTER — Ambulatory Visit
Admission: RE | Admit: 2024-01-19 | Discharge: 2024-01-19 | Disposition: A | Discharge status: Home / Self Care | Source: Ambulatory Visit | Attending: Acute Care | Admitting: Acute Care

## 2024-01-19 DIAGNOSIS — F17210 Nicotine dependence, cigarettes, uncomplicated: Secondary | ICD-10-CM | POA: Insufficient documentation

## 2024-01-19 DIAGNOSIS — Z87891 Personal history of nicotine dependence: Secondary | ICD-10-CM | POA: Insufficient documentation

## 2024-01-19 DIAGNOSIS — Z122 Encounter for screening for malignant neoplasm of respiratory organs: Secondary | ICD-10-CM | POA: Diagnosis present

## 2024-01-31 ENCOUNTER — Other Ambulatory Visit: Payer: Self-pay

## 2024-01-31 DIAGNOSIS — F17210 Nicotine dependence, cigarettes, uncomplicated: Secondary | ICD-10-CM

## 2024-01-31 DIAGNOSIS — Z87891 Personal history of nicotine dependence: Secondary | ICD-10-CM

## 2024-01-31 DIAGNOSIS — Z122 Encounter for screening for malignant neoplasm of respiratory organs: Secondary | ICD-10-CM

## 2024-03-23 ENCOUNTER — Ambulatory Visit: Admitting: Radiation Oncology

## 2024-04-05 ENCOUNTER — Ambulatory Visit: Admitting: Oncology

## 2024-04-05 ENCOUNTER — Other Ambulatory Visit
